# Patient Record
Sex: Female | Born: 1935 | Race: White | Hispanic: No | Smoking: Never smoker
Health system: Southern US, Community
[De-identification: ages and names within clinical notes are randomized; demographics above are authoritative.]

## PROBLEM LIST (undated history)

## (undated) DIAGNOSIS — N390 Urinary tract infection, site not specified: Secondary | ICD-10-CM

## (undated) DIAGNOSIS — E785 Hyperlipidemia, unspecified: Secondary | ICD-10-CM

## (undated) DIAGNOSIS — G250 Essential tremor: Secondary | ICD-10-CM

## (undated) DIAGNOSIS — R55 Syncope and collapse: Secondary | ICD-10-CM

## (undated) DIAGNOSIS — R1011 Right upper quadrant pain: Secondary | ICD-10-CM

## (undated) DIAGNOSIS — I4819 Other persistent atrial fibrillation: Secondary | ICD-10-CM

## (undated) DIAGNOSIS — G5600 Carpal tunnel syndrome, unspecified upper limb: Secondary | ICD-10-CM

## (undated) DIAGNOSIS — M1710 Unilateral primary osteoarthritis, unspecified knee: Secondary | ICD-10-CM

## (undated) DIAGNOSIS — Z9289 Personal history of other medical treatment: Secondary | ICD-10-CM

## (undated) DIAGNOSIS — I5042 Chronic combined systolic (congestive) and diastolic (congestive) heart failure: Secondary | ICD-10-CM

## (undated) DIAGNOSIS — F0390 Unspecified dementia without behavioral disturbance: Secondary | ICD-10-CM

## (undated) DIAGNOSIS — I10 Essential (primary) hypertension: Secondary | ICD-10-CM

## (undated) DIAGNOSIS — K219 Gastro-esophageal reflux disease without esophagitis: Secondary | ICD-10-CM

## (undated) DIAGNOSIS — M179 Osteoarthritis of knee, unspecified: Secondary | ICD-10-CM

## (undated) DIAGNOSIS — G5620 Lesion of ulnar nerve, unspecified upper limb: Secondary | ICD-10-CM

## (undated) HISTORY — PX: BREAST BIOPSY: SHX20

## (undated) HISTORY — DX: Urinary tract infection, site not specified: N39.0

## (undated) HISTORY — DX: Osteoarthritis of knee, unspecified: M17.9

## (undated) HISTORY — PX: BREAST CYST EXCISION: SHX579

## (undated) HISTORY — DX: Lesion of ulnar nerve, unspecified upper limb: G56.20

## (undated) HISTORY — DX: Carpal tunnel syndrome, unspecified upper limb: G56.00

## (undated) HISTORY — PX: TOTAL KNEE ARTHROPLASTY: SHX125

## (undated) HISTORY — DX: Right upper quadrant pain: R10.11

## (undated) HISTORY — DX: Essential (primary) hypertension: I10

## (undated) HISTORY — DX: Essential tremor: G25.0

## (undated) HISTORY — DX: Other persistent atrial fibrillation: I48.19

## (undated) HISTORY — DX: Personal history of other medical treatment: Z92.89

## (undated) HISTORY — DX: Chronic combined systolic (congestive) and diastolic (congestive) heart failure: I50.42

## (undated) HISTORY — DX: Unilateral primary osteoarthritis, unspecified knee: M17.10

## (undated) HISTORY — PX: UMBILICAL HERNIA REPAIR: SHX196

## (undated) HISTORY — DX: Gastro-esophageal reflux disease without esophagitis: K21.9

## (undated) HISTORY — PX: CATARACT EXTRACTION: SUR2

## (undated) HISTORY — DX: Hyperlipidemia, unspecified: E78.5

## (undated) HISTORY — PX: TUBAL LIGATION: SHX77

## (undated) HISTORY — DX: Syncope and collapse: R55

## (undated) HISTORY — DX: Unspecified dementia, unspecified severity, without behavioral disturbance, psychotic disturbance, mood disturbance, and anxiety: F03.90

## (undated) HISTORY — PX: OTHER SURGICAL HISTORY: SHX169

---

## 2002-10-18 HISTORY — PX: CARPAL TUNNEL RELEASE: SHX101

## 2003-10-19 LAB — HM COLONOSCOPY

## 2004-07-23 ENCOUNTER — Ambulatory Visit: Payer: Self-pay | Admitting: Gastroenterology

## 2005-03-10 ENCOUNTER — Emergency Department: Payer: Self-pay | Admitting: Unknown Physician Specialty

## 2005-05-04 ENCOUNTER — Ambulatory Visit: Payer: Self-pay | Admitting: Unknown Physician Specialty

## 2005-05-12 ENCOUNTER — Ambulatory Visit: Payer: Self-pay | Admitting: Unknown Physician Specialty

## 2006-06-16 ENCOUNTER — Ambulatory Visit: Payer: Self-pay | Admitting: Unknown Physician Specialty

## 2007-06-27 ENCOUNTER — Ambulatory Visit: Payer: Self-pay | Admitting: Unknown Physician Specialty

## 2008-02-19 ENCOUNTER — Ambulatory Visit: Payer: Self-pay | Admitting: Cardiology

## 2008-06-28 ENCOUNTER — Ambulatory Visit: Payer: Self-pay | Admitting: Unknown Physician Specialty

## 2009-06-30 ENCOUNTER — Ambulatory Visit: Payer: Self-pay | Admitting: Unknown Physician Specialty

## 2009-07-18 ENCOUNTER — Ambulatory Visit: Payer: Self-pay | Admitting: Family Medicine

## 2009-09-29 ENCOUNTER — Ambulatory Visit: Payer: Self-pay | Admitting: General Practice

## 2009-10-05 ENCOUNTER — Emergency Department: Payer: Self-pay

## 2010-03-30 ENCOUNTER — Ambulatory Visit: Payer: Self-pay | Admitting: General Practice

## 2010-04-15 ENCOUNTER — Inpatient Hospital Stay: Payer: Self-pay | Admitting: General Practice

## 2010-07-07 LAB — HM PAP SMEAR: HM Pap smear: NORMAL

## 2010-07-31 ENCOUNTER — Ambulatory Visit: Payer: Self-pay | Admitting: Unknown Physician Specialty

## 2010-08-18 HISTORY — PX: TOTAL SHOULDER REPLACEMENT: SUR1217

## 2011-09-16 ENCOUNTER — Ambulatory Visit: Payer: Self-pay | Admitting: Unknown Physician Specialty

## 2011-10-29 ENCOUNTER — Ambulatory Visit: Payer: Self-pay | Admitting: Neurology

## 2011-11-03 ENCOUNTER — Ambulatory Visit: Payer: Self-pay | Admitting: Internal Medicine

## 2011-11-04 ENCOUNTER — Ambulatory Visit: Payer: Self-pay | Admitting: Internal Medicine

## 2011-11-22 ENCOUNTER — Encounter: Payer: Self-pay | Admitting: Internal Medicine

## 2011-11-22 ENCOUNTER — Ambulatory Visit (INDEPENDENT_AMBULATORY_CARE_PROVIDER_SITE_OTHER): Payer: 59 | Admitting: Internal Medicine

## 2011-11-22 VITALS — BP 160/86 | HR 62 | Temp 97.7°F | Ht 60.5 in | Wt 188.0 lb

## 2011-11-22 DIAGNOSIS — I10 Essential (primary) hypertension: Secondary | ICD-10-CM | POA: Insufficient documentation

## 2011-11-22 DIAGNOSIS — G250 Essential tremor: Secondary | ICD-10-CM

## 2011-11-22 DIAGNOSIS — E785 Hyperlipidemia, unspecified: Secondary | ICD-10-CM

## 2011-11-22 NOTE — Assessment & Plan Note (Signed)
Blood pressure slightly elevated today. Will obtain previous records as to typical blood pressure. We'll check renal function with labs today. Continue current medications. Followup 3 months.

## 2011-11-22 NOTE — Assessment & Plan Note (Signed)
Symptomatically well controlled with use of Topamax, however patient is concerned about side effects and had some recent changes in her vision in her right eye. Encouraged her to followup with her ophthalmologist Friday for evaluation, an effort to determine if this is related to use of Topamax. If another etiology for her change in vision is determined, then would resume Topamax. Followup here in 3 months.

## 2011-11-22 NOTE — Assessment & Plan Note (Signed)
Will get records on recent lipid profile, and will check LFTs with labs today. Continue pravastatin.

## 2011-11-22 NOTE — Progress Notes (Signed)
Subjective:    Patient ID: Cheryl Winters, female    DOB: 1934-10-08, 78 y.o.   MRN: [id number]  HPI 78 year old female with history of hypertension, hyperlipidemia, and benign essential tremor presents to establish care. Her primary concern today, is her benign essential tremor. She notes she has some improvement with this with the use of Topamax, however recently she has some intermittent blurring in her vision in her right eye and she is concerned this might be related. She has scheduled an appointment with her ophthalmologist for later this week. She has stopped her Topamax and notes recurrence of her tremor. She notes that her tremor is worsened with stress and with intention. However, with use of Topamax she was able to write without difficulty. Aside from the potential visual change she noted no other side effects from Topamax.  In regards to her high blood pressure, she reports full compliance with her medicines. However, she does note that her blood pressures typically elevated. She does not regularly check at home. She denies any headache, palpitations, or chest pain.  Outpatient Encounter Prescriptions as of 11/22/2011  Medication Sig Dispense Refill  . calcium carbonate (OS-CAL - DOSED IN MG OF ELEMENTAL CALCIUM) 1250 MG tablet Take 1 tablet by mouth daily.      . cholecalciferol (VITAMIN D) 1000 UNITS tablet Take 1,000 Units by mouth daily.      . fish oil-omega-3 fatty acids 1000 MG capsule Take 2 g by mouth daily.      . hydrALAZINE (APRESOLINE) 25 MG tablet Take 25 mg by mouth 2 (two) times daily.      . hydrochlorothiazide (HYDRODIURIL) 25 MG tablet Take 25 mg by mouth daily.      Marland Kitchen lisinopril (PRINIVIL,ZESTRIL) 40 MG tablet Take 40 mg by mouth daily.      . Multiple Vitamins-Minerals (MULTIVITAMIN WITH MINERALS) tablet Take 1 tablet by mouth daily.      . pravastatin (PRAVACHOL) 40 MG tablet Take 40 mg by mouth daily.      . propranolol (INDERAL LA) 120 MG 24 hr capsule Take 120 mg by  mouth daily.      Marland Kitchen topiramate (TOPAMAX) 25 MG tablet         Review of Systems  Constitutional: Negative for fever, chills, appetite change, fatigue and unexpected weight change.  HENT: Negative for ear pain, congestion, sore throat, trouble swallowing, neck pain, voice change and sinus pressure.   Eyes: Positive for visual disturbance.  Respiratory: Negative for cough, shortness of breath, wheezing and stridor.   Cardiovascular: Negative for chest pain, palpitations and leg swelling.  Gastrointestinal: Negative for nausea, vomiting, abdominal pain, diarrhea, constipation, blood in stool, abdominal distention and anal bleeding.  Genitourinary: Negative for dysuria and flank pain.  Musculoskeletal: Negative for myalgias, arthralgias and gait problem.  Skin: Negative for color change and rash.  Neurological: Positive for tremors. Negative for dizziness and headaches.  Hematological: Negative for adenopathy. Does not bruise/bleed easily.  Psychiatric/Behavioral: Negative for suicidal ideas, sleep disturbance and dysphoric mood. The patient is not nervous/anxious.    BP 160/86  Pulse 62  Temp(Src) 97.7 F (36.5 C) (Oral)  Ht 5' 0.5" (1.537 m)  Wt 188 lb (85.276 kg)  BMI 36.11 kg/m2  SpO2 97%     Objective:   Physical Exam  Constitutional: She is oriented to person, place, and time. She appears well-developed and well-nourished. No distress.  HENT:  Head: Normocephalic and atraumatic.  Right Ear: External ear normal.  Left Ear: External ear  normal.  Nose: Nose normal.  Mouth/Throat: Oropharynx is clear and moist. No oropharyngeal exudate.  Eyes: Conjunctivae are normal. Pupils are equal, round, and reactive to light. Right eye exhibits no discharge. Left eye exhibits no discharge. No scleral icterus.  Neck: Normal range of motion. Neck supple. No tracheal deviation present. No thyromegaly present.  Cardiovascular: Normal rate, regular rhythm, normal heart sounds and intact distal  pulses.  Exam reveals no gallop and no friction rub.   No murmur heard. Pulmonary/Chest: Effort normal and breath sounds normal. No respiratory distress. She has no wheezes. She has no rales. She exhibits no tenderness.  Abdominal: Soft. Bowel sounds are normal. She exhibits no distension and no mass. There is no tenderness. There is no rebound and no guarding.  Musculoskeletal: Normal range of motion. She exhibits no edema and no tenderness.  Lymphadenopathy:    She has no cervical adenopathy.  Neurological: She is alert and oriented to person, place, and time. She displays tremor. No cranial nerve deficit. She exhibits normal muscle tone. Coordination normal.  Skin: Skin is warm and dry. No rash noted. She is not diaphoretic. No erythema. No pallor.  Psychiatric: She has a normal mood and affect. Her behavior is normal. Judgment and thought content normal.          Assessment & Plan:

## 2011-11-23 LAB — COMPREHENSIVE METABOLIC PANEL
Alkaline Phosphatase: 53 U/L (ref 39–117)
Glucose, Bld: 80 mg/dL (ref 70–99)
Sodium: 141 mEq/L (ref 135–145)
Total Bilirubin: 0.8 mg/dL (ref 0.3–1.2)
Total Protein: 6.7 g/dL (ref 6.0–8.3)

## 2012-01-24 ENCOUNTER — Encounter: Payer: Self-pay | Admitting: Internal Medicine

## 2012-01-27 ENCOUNTER — Other Ambulatory Visit: Payer: Self-pay | Admitting: *Deleted

## 2012-01-27 MED ORDER — HYDROCHLOROTHIAZIDE 25 MG PO TABS: 25.0000 mg | ORAL_TABLET | Freq: Every day | ORAL | Status: DC

## 2012-01-27 MED ORDER — HYDRALAZINE HCL 25 MG PO TABS: 25.0000 mg | ORAL_TABLET | Freq: Two times a day (BID) | ORAL | Status: DC

## 2012-01-27 MED ORDER — LISINOPRIL 40 MG PO TABS: 40.0000 mg | ORAL_TABLET | Freq: Every day | ORAL | Status: DC

## 2012-01-27 MED ORDER — PRAVASTATIN SODIUM 40 MG PO TABS: 40.0000 mg | ORAL_TABLET | Freq: Every day | ORAL | Status: DC

## 2012-02-24 ENCOUNTER — Encounter: Payer: Self-pay | Admitting: Internal Medicine

## 2012-02-24 ENCOUNTER — Ambulatory Visit (INDEPENDENT_AMBULATORY_CARE_PROVIDER_SITE_OTHER): Payer: 59 | Admitting: Internal Medicine

## 2012-02-24 VITALS — BP 122/70 | HR 81 | Temp 98.0°F | Wt 182.5 lb

## 2012-02-24 DIAGNOSIS — G250 Essential tremor: Secondary | ICD-10-CM

## 2012-02-24 DIAGNOSIS — E785 Hyperlipidemia, unspecified: Secondary | ICD-10-CM

## 2012-02-24 DIAGNOSIS — I10 Essential (primary) hypertension: Secondary | ICD-10-CM

## 2012-02-24 NOTE — Assessment & Plan Note (Signed)
We'll plan to recheck LFTs and lipids in October 2013. Goal LDL less than 100. Continue pravastatin.

## 2012-02-24 NOTE — Assessment & Plan Note (Signed)
Patient has been restarted on Neurontin by her local neurologist. Cheryl Winters continue to monitor to see if any improvement in her symptoms. She has been intolerant of other medications including Topamax. We also discussed potential referral to tertiary care center if symptoms are persistent.

## 2012-02-24 NOTE — Assessment & Plan Note (Signed)
Blood pressure is well-controlled today. We'll continue current medications. We'll plan to recheck renal function with physical exam in October 2013.

## 2012-02-24 NOTE — Progress Notes (Signed)
Subjective:    Patient ID: Cheryl Winters, female    DOB: [birth year], 78 y.o.   MRN: [id number]  HPI 78 year old female with history of hypertension, hyperlipidemia, and benign essential tremor presents for followup. She reports that she was recently seen by her neurologist and was taken off Topamax. She was started on Neurontin to help with tremor. She has not yet started taking the medication. She again notes tremor in both her head and right arm. This sometimes interferes with her writing. She discussed potential referral to tertiary care center with her neurologist locally but has decided to defer for now.  In regards to her hypertension and hyperlipidemia, she reports full compliance with her medications. She denies any noted side effects such as headache or myalgia. She denies any new concerns today.  Outpatient Encounter Prescriptions as of 02/24/2012  Medication Sig Dispense Refill  . calcium carbonate (OS-CAL - DOSED IN MG OF ELEMENTAL CALCIUM) 1250 MG tablet Take 1 tablet by mouth daily.      . cholecalciferol (VITAMIN D) 1000 UNITS tablet Take 1,000 Units by mouth daily.      . fish oil-omega-3 fatty acids 1000 MG capsule Take 2 g by mouth daily.      . hydrALAZINE (APRESOLINE) 25 MG tablet Take 1 tablet (25 mg total) by mouth 2 (two) times daily.  180 tablet  2  . hydrochlorothiazide (HYDRODIURIL) 25 MG tablet Take 1 tablet (25 mg total) by mouth daily.  90 tablet  2  . lisinopril (PRINIVIL,ZESTRIL) 40 MG tablet Take 1 tablet (40 mg total) by mouth daily.  90 tablet  2  . Multiple Vitamins-Minerals (MULTIVITAMIN WITH MINERALS) tablet Take 1 tablet by mouth daily.      . pravastatin (PRAVACHOL) 40 MG tablet Take 1 tablet (40 mg total) by mouth daily.  90 tablet  2  . gabapentin (NEURONTIN) 100 MG capsule       . DISCONTD: propranolol (INDERAL LA) 120 MG 24 hr capsule Take 120 mg by mouth daily.      Marland Kitchen DISCONTD: topiramate (TOPAMAX) 25 MG tablet         Review of Systems  Constitutional:  Negative for fever, chills, appetite change, fatigue and unexpected weight change.  HENT: Negative for ear pain, congestion, sore throat, trouble swallowing, neck pain, voice change and sinus pressure.   Eyes: Negative for visual disturbance.  Respiratory: Negative for cough, shortness of breath, wheezing and stridor.   Cardiovascular: Negative for chest pain, palpitations and leg swelling.  Gastrointestinal: Negative for nausea, vomiting, abdominal pain, diarrhea, constipation, blood in stool, abdominal distention and anal bleeding.  Genitourinary: Negative for dysuria and flank pain.  Musculoskeletal: Negative for myalgias, arthralgias and gait problem.  Skin: Negative for color change and rash.  Neurological: Positive for tremors. Negative for dizziness and headaches.  Hematological: Negative for adenopathy. Does not bruise/bleed easily.  Psychiatric/Behavioral: Negative for suicidal ideas, sleep disturbance and dysphoric mood. The patient is not nervous/anxious.    BP 122/70  Pulse 81  Temp(Src) 98 F (36.7 C) (Oral)  Wt 182 lb 8 oz (82.781 kg)     Objective:   Physical Exam  Constitutional: She is oriented to person, place, and time. She appears well-developed and well-nourished. No distress.  HENT:  Head: Normocephalic and atraumatic.  Right Ear: External ear normal.  Left Ear: External ear normal.  Nose: Nose normal.  Mouth/Throat: Oropharynx is clear and moist. No oropharyngeal exudate.  Eyes: Conjunctivae are normal. Pupils are equal, round, and reactive to  light. Right eye exhibits no discharge. Left eye exhibits no discharge. No scleral icterus.  Neck: Normal range of motion. Neck supple. No tracheal deviation present. No thyromegaly present.  Cardiovascular: Normal rate, regular rhythm, normal heart sounds and intact distal pulses.  Exam reveals no gallop and no friction rub.   No murmur heard. Pulmonary/Chest: Effort normal and breath sounds normal. No respiratory  distress. She has no wheezes. She has no rales. She exhibits no tenderness.  Musculoskeletal: Normal range of motion. She exhibits no edema and no tenderness.  Lymphadenopathy:    She has no cervical adenopathy.  Neurological: She is alert and oriented to person, place, and time. She displays tremor. No cranial nerve deficit. She exhibits normal muscle tone. Coordination normal.  Skin: Skin is warm and dry. No rash noted. She is not diaphoretic. No erythema. No pallor.  Psychiatric: She has a normal mood and affect. Her behavior is normal. Judgment and thought content normal.          Assessment & Plan:

## 2012-08-21 ENCOUNTER — Ambulatory Visit (INDEPENDENT_AMBULATORY_CARE_PROVIDER_SITE_OTHER): Payer: 59 | Admitting: Internal Medicine

## 2012-08-21 ENCOUNTER — Encounter: Payer: Self-pay | Admitting: Internal Medicine

## 2012-08-21 VITALS — BP 110/72 | HR 58 | Temp 97.8°F | Ht 60.75 in | Wt 175.8 lb

## 2012-08-21 DIAGNOSIS — G252 Other specified forms of tremor: Secondary | ICD-10-CM

## 2012-08-21 DIAGNOSIS — Z0000 Encounter for general adult medical examination without abnormal findings: Secondary | ICD-10-CM | POA: Insufficient documentation

## 2012-08-21 DIAGNOSIS — G250 Essential tremor: Secondary | ICD-10-CM | POA: Insufficient documentation

## 2012-08-21 DIAGNOSIS — B3789 Other sites of candidiasis: Secondary | ICD-10-CM | POA: Insufficient documentation

## 2012-08-21 DIAGNOSIS — I10 Essential (primary) hypertension: Secondary | ICD-10-CM

## 2012-08-21 DIAGNOSIS — E785 Hyperlipidemia, unspecified: Secondary | ICD-10-CM

## 2012-08-21 LAB — LIPID PANEL
Cholesterol: 184 mg/dL (ref 0–200)
HDL: 64.7 mg/dL (ref >39.00)
LDL Cholesterol: 109 mg/dL — ABNORMAL HIGH (ref 0–99)
Total CHOL/HDL Ratio: 3
Triglycerides: 52 mg/dL (ref 0.0–149.0)

## 2012-08-21 LAB — COMPREHENSIVE METABOLIC PANEL
AST: 22 U/L (ref 0–37)
Alkaline Phosphatase: 45 U/L (ref 39–117)
BUN: 16 mg/dL (ref 6–23)
Creatinine, Ser: 0.6 mg/dL (ref 0.4–1.2)
Glucose, Bld: 82 mg/dL (ref 70–99)
Total Bilirubin: 0.8 mg/dL (ref 0.3–1.2)

## 2012-08-21 MED ORDER — LISINOPRIL 40 MG PO TABS: 40.0000 mg | ORAL_TABLET | Freq: Every day | ORAL | Status: DC

## 2012-08-21 MED ORDER — NYSTATIN 100000 UNIT/GM EX POWD: Freq: Four times a day (QID) | CUTANEOUS | Status: DC

## 2012-08-21 MED ORDER — PRAVASTATIN SODIUM 40 MG PO TABS: 40.0000 mg | ORAL_TABLET | Freq: Every day | ORAL | Status: DC

## 2012-08-21 MED ORDER — HYDROCHLOROTHIAZIDE 25 MG PO TABS: 25.0000 mg | ORAL_TABLET | Freq: Every day | ORAL | Status: DC

## 2012-08-21 MED ORDER — HYDRALAZINE HCL 25 MG PO TABS: 25.0000 mg | ORAL_TABLET | Freq: Two times a day (BID) | ORAL | Status: DC

## 2012-08-21 NOTE — Assessment & Plan Note (Signed)
BP well controlled on current medications. Will check renal function with labs today. Follow up 6 months and prn.

## 2012-08-21 NOTE — Assessment & Plan Note (Signed)
Will check lipids and LFTs with labs today. 

## 2012-08-21 NOTE — Progress Notes (Signed)
Subjective:    Patient ID: Cheryl Winters, female    DOB: 28-Apr-1934, 78 y.o.   MRN: [id number]  HPI  The patient is here for annual Medicare wellness examination and management of other chronic and acute problems.   The risk factors are reflected in the social history.  The roster of all physicians providing medical care to patient - is listed in the Snapshot section of the chart.  Activities of daily living:  The patient is 100% independent in all ADLs: dressing, toileting, feeding as well as independent mobility  Home safety : The patient has smoke detectors in the home. They wear seatbelts.  There are no firearms at home. There is no violence in the home.   There is no risks for hepatitis, STDs or HIV. There is no history of blood transfusion. They have no travel history to infectious disease endemic areas of the world.  The patient has not seen their dentist in the last six month. (no dentist, wears dentures) They have not seen their eye doctor in the last year. [facility] in past) No issues with hearing. They have deferred audiologic testing in the last year.   They do not  have excessive sun exposure. Discussed the need for sun protection: hats, long sleeves and use of sunscreen if there is significant sun exposure. Dermatologist - none currently  Diet: the importance of a healthy diet is discussed. They do have a healthy diet.  The benefits of regular aerobic exercise were discussed. She walks occasionally.  Depression screen: there are no signs or vegative symptoms of depression- irritability, change in appetite, anhedonia, sadness/tearfullness.  Cognitive assessment: the patient manages all their financial and personal affairs and is actively engaged. They could relate day,date,year and events.  HCPOA -daughters, [name].  The following portions of the patient's history were reviewed and updated as appropriate: allergies, current medications, past  family history, past medical history,  past surgical history, past social history  and problem list.  Visual acuity was not assessed per patient preference since she has regular follow up with her ophthalmologist. Hearing and body mass index were assessed and reviewed.   During the course of the visit the patient was educated and counseled about appropriate screening and preventive services including : fall prevention , diabetes screening, nutrition counseling, colorectal cancer screening, and recommended immunizations.    Outpatient Encounter Prescriptions as of 08/21/2012  Medication Sig Dispense Refill  . calcium carbonate (OS-CAL - DOSED IN MG OF ELEMENTAL CALCIUM) 1250 MG tablet Take 1 tablet by mouth daily.      . cholecalciferol (VITAMIN D) 1000 UNITS tablet Take 1,000 Units by mouth daily.      . fish oil-omega-3 fatty acids 1000 MG capsule Take 2 g by mouth daily.      . hydrALAZINE (APRESOLINE) 25 MG tablet Take 1 tablet (25 mg total) by mouth 2 (two) times daily.  180 tablet  2  . hydrochlorothiazide (HYDRODIURIL) 25 MG tablet Take 1 tablet (25 mg total) by mouth daily.  90 tablet  2  . lisinopril (PRINIVIL,ZESTRIL) 40 MG tablet Take 1 tablet (40 mg total) by mouth daily.  90 tablet  2  . Multiple Vitamins-Minerals (MULTIVITAMIN WITH MINERALS) tablet Take 1 tablet by mouth daily.      . pravastatin (PRAVACHOL) 40 MG tablet Take 1 tablet (40 mg total) by mouth daily.  90 tablet  2   BP 110/72  Pulse 58  Temp 97.8 F (36.6 C) (Oral)  Ht 5' 0.75" (1.543 m)  Wt 175 lb 12 oz (79.72 kg)  BMI 33.48 kg/m2  SpO2 97%  Review of Systems  Constitutional: Negative for fever, chills, appetite change, fatigue and unexpected weight change.  HENT: Negative for ear pain, congestion, sore throat, trouble swallowing, neck pain, voice change and sinus pressure.   Eyes: Negative for visual disturbance.  Respiratory: Negative for cough, shortness of breath, wheezing and stridor.   Cardiovascular:  Negative for chest pain, palpitations and leg swelling.  Gastrointestinal: Negative for nausea, vomiting, abdominal pain, diarrhea, constipation, blood in stool, abdominal distention and anal bleeding.  Genitourinary: Negative for dysuria and flank pain.  Musculoskeletal: Negative for myalgias, arthralgias and gait problem.  Skin: Negative for color change and rash.  Neurological: Positive for tremors. Negative for dizziness and headaches.  Hematological: Negative for adenopathy. Does not bruise/bleed easily.  Psychiatric/Behavioral: Negative for suicidal ideas, sleep disturbance and dysphoric mood. The patient is not nervous/anxious.        Objective:   Physical Exam  Constitutional: She is oriented to person, place, and time. She appears well-developed and well-nourished. No distress.  HENT:  Head: Normocephalic and atraumatic.  Right Ear: External ear normal.  Left Ear: External ear normal.  Nose: Nose normal.  Mouth/Throat: Oropharynx is clear and moist. No oropharyngeal exudate.  Eyes: Conjunctivae normal are normal. Pupils are equal, round, and reactive to light. Right eye exhibits no discharge. Left eye exhibits no discharge. No scleral icterus.  Neck: Normal range of motion. Neck supple. No tracheal deviation present. No thyromegaly present.  Cardiovascular: Normal rate, regular rhythm, normal heart sounds and intact distal pulses.  Exam reveals no gallop and no friction rub.   No murmur heard. Pulmonary/Chest: Effort normal and breath sounds normal. No respiratory distress. She has no wheezes. She has no rales. She exhibits no tenderness. Right breast exhibits no inverted nipple, no mass, no nipple discharge, no skin change and no tenderness. Left breast exhibits no inverted nipple, no mass, no nipple discharge, no skin change and no tenderness. Breasts are symmetrical.  Abdominal: Soft. Bowel sounds are normal. She exhibits no distension. There is no tenderness. There is no  guarding.  Musculoskeletal: Normal range of motion. She exhibits no edema and no tenderness.  Lymphadenopathy:    She has no cervical adenopathy.  Neurological: She is alert and oriented to person, place, and time. No cranial nerve deficit. She exhibits normal muscle tone. Coordination normal.  Skin: Skin is warm and dry. No rash noted. She is not diaphoretic. No erythema. No pallor.  Psychiatric: She has a normal mood and affect. Her behavior is normal. Judgment and thought content normal.          Assessment & Plan:

## 2012-08-21 NOTE — Assessment & Plan Note (Signed)
Gen med exam is normal today including breast exam. Pelvic exam and PAP deferred because of age and pt preference. Health maintenance is UTD except for mammogram which will be scheduled. Labs today including CMP, lipids. Will set up dermatology evaluation and neurology referral for essential tremor.  Vaccines UTD. Follow up 6 months and prn.

## 2012-08-21 NOTE — Assessment & Plan Note (Signed)
Exam is consistent with candidiasis of skin under breast. Will treat with topical nystatin powder. Pt will call if symptoms not improving.

## 2012-08-21 NOTE — Assessment & Plan Note (Signed)
No improvement with neurontin as prescribed by previous neurologist. Would like second opinion about treatment. Will set up neurology evaluation.

## 2012-08-22 ENCOUNTER — Encounter: Payer: Self-pay | Admitting: *Deleted

## 2012-08-22 NOTE — Progress Notes (Signed)
Result letter mailed to patient's home address.

## 2012-08-28 ENCOUNTER — Ambulatory Visit: Payer: 59 | Admitting: Neurology

## 2012-09-21 ENCOUNTER — Ambulatory Visit: Payer: Self-pay | Admitting: Internal Medicine

## 2012-10-12 ENCOUNTER — Encounter: Payer: Self-pay | Admitting: Internal Medicine

## 2012-10-23 ENCOUNTER — Telehealth: Payer: Self-pay | Admitting: Internal Medicine

## 2012-10-23 NOTE — Telephone Encounter (Signed)
Wanting mammogram results.

## 2012-10-23 NOTE — Telephone Encounter (Signed)
Mammogram results from 09/21/2012 were normal, patient advised via telephone.

## 2012-12-29 ENCOUNTER — Ambulatory Visit (INDEPENDENT_AMBULATORY_CARE_PROVIDER_SITE_OTHER): Payer: 59 | Admitting: Internal Medicine

## 2012-12-29 ENCOUNTER — Encounter: Payer: Self-pay | Admitting: Internal Medicine

## 2012-12-29 ENCOUNTER — Telehealth: Payer: Self-pay | Admitting: Internal Medicine

## 2012-12-29 VITALS — BP 120/80 | HR 68 | Temp 98.1°F | Wt 172.0 lb

## 2012-12-29 DIAGNOSIS — G250 Essential tremor: Secondary | ICD-10-CM

## 2012-12-29 DIAGNOSIS — Z780 Asymptomatic menopausal state: Secondary | ICD-10-CM

## 2012-12-29 DIAGNOSIS — I10 Essential (primary) hypertension: Secondary | ICD-10-CM

## 2012-12-29 LAB — COMPREHENSIVE METABOLIC PANEL
ALT: 19 U/L (ref 0–35)
Albumin: 4.1 g/dL (ref 3.5–5.2)
BUN: 15 mg/dL (ref 6–23)
CO2: 30 mEq/L (ref 19–32)
Calcium: 9.7 mg/dL (ref 8.4–10.5)
Chloride: 104 mEq/L (ref 96–112)
Creatinine, Ser: 0.7 mg/dL (ref 0.4–1.2)
GFR: 84.85 mL/min (ref >60.00)
Potassium: 4.3 mEq/L (ref 3.5–5.1)

## 2012-12-29 LAB — MICROALBUMIN / CREATININE URINE RATIO
Creatinine,U: 57.2 mg/dL
Microalb Creat Ratio: 5.9 mg/g (ref 0.0–30.0)

## 2012-12-29 NOTE — Assessment & Plan Note (Signed)
BP Readings from Last 3 Encounters:  12/29/12 120/80  08/21/12 110/72  02/24/12 122/70   Blood pressure well-controlled on current medications. Will check renal function with labs today.

## 2012-12-29 NOTE — Progress Notes (Signed)
Subjective:    Patient ID: Cheryl Winters, female    DOB: October 23, 1933, 78 y.o.   MRN: [id number]  HPI 79 year old female with history of hypertension and benign essential tremor presents for followup. She reports she is generally feeling well. In regards to her tremor, we have tried to set her up with a neurologist as she has tried several medications in the past with no improvement. However, there was miscommunication and she never received appointment time. She reports that symptoms of essential tremor unchanged. Next  In regards to hypertension, she reports compliance with her medication. She denies any recent chest pain, headache, palpitations.  Outpatient Encounter Prescriptions as of 12/29/2012  Medication Sig Dispense Refill  . Biotin 5 MG CAPS Take 5 mg by mouth daily.      . calcium carbonate (OS-CAL - DOSED IN MG OF ELEMENTAL CALCIUM) 1250 MG tablet Take 1 tablet by mouth daily.      . cholecalciferol (VITAMIN D) 1000 UNITS tablet Take 1,000 Units by mouth daily.      . fish oil-omega-3 fatty acids 1000 MG capsule Take 2 g by mouth daily.      . hydrALAZINE (APRESOLINE) 25 MG tablet Take 1 tablet (25 mg total) by mouth 2 (two) times daily.  180 tablet  2  . hydrochlorothiazide (HYDRODIURIL) 25 MG tablet Take 1 tablet (25 mg total) by mouth daily.  90 tablet  2  . lisinopril (PRINIVIL,ZESTRIL) 40 MG tablet Take 1 tablet (40 mg total) by mouth daily.  90 tablet  2  . MAGNESIUM GLYCINATE PLUS PO Take 400 mg by mouth daily.      . Multiple Vitamins-Minerals (MULTIVITAMIN WITH MINERALS) tablet Take 1 tablet by mouth daily.      Marland Kitchen nystatin (MYCOSTATIN) powder Apply topically 4 (four) times daily.  15 g  0  . pravastatin (PRAVACHOL) 40 MG tablet Take 1 tablet (40 mg total) by mouth daily.  90 tablet  2   No facility-administered encounter medications on file as of 12/29/2012.   BP 120/80  Pulse 68  Temp(Src) 98.1 F (36.7 C) (Oral)  Wt 172 lb (78.019 kg)  BMI 32.77 kg/m2  SpO2 96%  Review  of Systems  Constitutional: Negative for fever, chills, appetite change, fatigue and unexpected weight change.  HENT: Negative for ear pain, congestion, sore throat, trouble swallowing, neck pain, voice change and sinus pressure.   Eyes: Negative for visual disturbance.  Respiratory: Negative for cough, shortness of breath, wheezing and stridor.   Cardiovascular: Negative for chest pain, palpitations and leg swelling.  Gastrointestinal: Negative for nausea, vomiting, abdominal pain, diarrhea, constipation, blood in stool, abdominal distention and anal bleeding.  Genitourinary: Negative for dysuria and flank pain.  Musculoskeletal: Negative for myalgias, arthralgias and gait problem.  Skin: Negative for color change and rash.  Neurological: Positive for tremors. Negative for dizziness and headaches.  Hematological: Negative for adenopathy. Does not bruise/bleed easily.  Psychiatric/Behavioral: Negative for suicidal ideas, sleep disturbance and dysphoric mood. The patient is not nervous/anxious.        Objective:   Physical Exam  Constitutional: She is oriented to person, place, and time. She appears well-developed and well-nourished. No distress.  HENT:  Head: Normocephalic and atraumatic.  Right Ear: External ear normal.  Left Ear: External ear normal.  Nose: Nose normal.  Mouth/Throat: Oropharynx is clear and moist. No oropharyngeal exudate.  Eyes: Conjunctivae are normal. Pupils are equal, round, and reactive to light. Right eye exhibits no discharge. Left eye exhibits no discharge.  No scleral icterus.  Neck: Normal range of motion. Neck supple. No tracheal deviation present. No thyromegaly present.  Cardiovascular: Normal rate, regular rhythm, normal heart sounds and intact distal pulses.  Exam reveals no gallop and no friction rub.   No murmur heard. Pulmonary/Chest: Effort normal and breath sounds normal. No respiratory distress. She has no wheezes. She has no rales. She exhibits no  tenderness.  Musculoskeletal: Normal range of motion. She exhibits no edema and no tenderness.  Lymphadenopathy:    She has no cervical adenopathy.  Neurological: She is alert and oriented to person, place, and time. She displays tremor (head). No cranial nerve deficit. She exhibits normal muscle tone. Coordination normal.  Skin: Skin is warm and dry. No rash noted. She is not diaphoretic. No erythema. No pallor.  Psychiatric: She has a normal mood and affect. Her behavior is normal. Judgment and thought content normal.          Assessment & Plan:

## 2012-12-29 NOTE — Assessment & Plan Note (Signed)
Symptoms unchanged. Will reschedule evaluation with neurology for essential tremor.

## 2012-12-29 NOTE — Telephone Encounter (Signed)
Pt is needing note for her weight loss program that state what a good weight loss goal would be for the pt. Please call pt when ready to come pick it up. Best number [phone]

## 2013-01-01 ENCOUNTER — Ambulatory Visit: Payer: Self-pay | Admitting: Internal Medicine

## 2013-01-02 ENCOUNTER — Telehealth: Payer: Self-pay | Admitting: Internal Medicine

## 2013-01-02 NOTE — Telephone Encounter (Signed)
Patient informed and verbalized understanding

## 2013-01-02 NOTE — Telephone Encounter (Signed)
Bone density was normal. T- score -1.

## 2013-01-23 ENCOUNTER — Encounter: Payer: Self-pay | Admitting: Internal Medicine

## 2013-03-02 ENCOUNTER — Other Ambulatory Visit: Payer: Self-pay | Admitting: Internal Medicine

## 2013-03-02 NOTE — Telephone Encounter (Signed)
Ok to refill 

## 2013-06-01 ENCOUNTER — Other Ambulatory Visit: Payer: Self-pay | Admitting: Internal Medicine

## 2013-08-02 ENCOUNTER — Ambulatory Visit (INDEPENDENT_AMBULATORY_CARE_PROVIDER_SITE_OTHER): Payer: 59 | Admitting: Internal Medicine

## 2013-08-02 ENCOUNTER — Encounter: Payer: Self-pay | Admitting: Internal Medicine

## 2013-08-02 VITALS — BP 134/80 | HR 50 | Temp 98.4°F | Ht 60.25 in | Wt 179.0 lb

## 2013-08-02 DIAGNOSIS — Z0000 Encounter for general adult medical examination without abnormal findings: Secondary | ICD-10-CM

## 2013-08-02 NOTE — Progress Notes (Signed)
  Subjective:    Patient ID: Cheryl Winters, female    DOB: 11/12/1933, 79 y.o.   MRN: [id number]  HPI No charge - scheduled in error   Review of Systems     Objective:   Physical Exam        Assessment & Plan:

## 2013-08-27 ENCOUNTER — Encounter: Payer: Self-pay | Admitting: Internal Medicine

## 2013-08-27 ENCOUNTER — Ambulatory Visit (INDEPENDENT_AMBULATORY_CARE_PROVIDER_SITE_OTHER): Payer: 59 | Admitting: Internal Medicine

## 2013-08-27 ENCOUNTER — Encounter (INDEPENDENT_AMBULATORY_CARE_PROVIDER_SITE_OTHER): Payer: Self-pay

## 2013-08-27 VITALS — BP 140/80 | HR 59 | Temp 98.5°F | Ht 60.25 in | Wt 173.8 lb

## 2013-08-27 DIAGNOSIS — I10 Essential (primary) hypertension: Secondary | ICD-10-CM

## 2013-08-27 DIAGNOSIS — G250 Essential tremor: Secondary | ICD-10-CM

## 2013-08-27 DIAGNOSIS — Z0000 Encounter for general adult medical examination without abnormal findings: Secondary | ICD-10-CM

## 2013-08-27 DIAGNOSIS — Z780 Asymptomatic menopausal state: Secondary | ICD-10-CM

## 2013-08-27 DIAGNOSIS — E785 Hyperlipidemia, unspecified: Secondary | ICD-10-CM

## 2013-08-27 DIAGNOSIS — Z1239 Encounter for other screening for malignant neoplasm of breast: Secondary | ICD-10-CM

## 2013-08-27 LAB — CBC WITH DIFFERENTIAL/PLATELET
Basophils Absolute: 0 10*3/uL (ref 0.0–0.1)
Eosinophils Absolute: 0.1 10*3/uL (ref 0.0–0.7)
Eosinophils Relative: 1.4 % (ref 0.0–5.0)
HCT: 39.1 % (ref 36.0–46.0)
Lymphs Abs: 1.5 10*3/uL (ref 0.7–4.0)
MCHC: 34.2 g/dL (ref 30.0–36.0)
MCV: 89.8 fl (ref 78.0–100.0)
Monocytes Absolute: 0.4 10*3/uL (ref 0.1–1.0)
Neutrophils Relative %: 68.2 % (ref 43.0–77.0)
Platelets: 191 10*3/uL (ref 150.0–400.0)
RDW: 13 % (ref 11.5–14.6)
WBC: 6.2 10*3/uL (ref 4.5–10.5)

## 2013-08-27 LAB — MICROALBUMIN / CREATININE URINE RATIO
Creatinine,U: 230.4 mg/dL
Microalb, Ur: 5 mg/dL — ABNORMAL HIGH (ref 0.0–1.9)

## 2013-08-27 LAB — COMPREHENSIVE METABOLIC PANEL
AST: 22 U/L (ref 0–37)
Albumin: 4.1 g/dL (ref 3.5–5.2)
BUN: 14 mg/dL (ref 6–23)
Calcium: 9.6 mg/dL (ref 8.4–10.5)
Chloride: 104 mEq/L (ref 96–112)
Creatinine, Ser: 0.6 mg/dL (ref 0.4–1.2)
Glucose, Bld: 99 mg/dL (ref 70–99)

## 2013-08-27 LAB — LIPID PANEL
Cholesterol: 205 mg/dL — ABNORMAL HIGH (ref 0–200)
HDL: 71.1 mg/dL (ref >39.00)
Triglycerides: 56 mg/dL (ref 0.0–149.0)

## 2013-08-27 LAB — LDL CHOLESTEROL, DIRECT: Direct LDL: 123.9 mg/dL

## 2013-08-27 NOTE — Assessment & Plan Note (Signed)
BP Readings from Last 3 Encounters:  08/27/13 140/80  08/02/13 134/80  12/29/12 120/80   BP generally well controlled on current medications. Will continue.

## 2013-08-27 NOTE — Assessment & Plan Note (Signed)
Benign head tremor. Unable to tolerate neurontin and primodone because of dizziness and drowsiness. Encouraged her to follow up with her neurologist. Will continue propranolol.

## 2013-08-27 NOTE — Progress Notes (Signed)
Pre visit review using our clinic review tool, if applicable. No additional management support is needed unless otherwise documented below in the visit note. 

## 2013-08-27 NOTE — Assessment & Plan Note (Signed)
Will check lipids and LFTs with labs today. 

## 2013-08-27 NOTE — Assessment & Plan Note (Signed)
General medical exam including breast exam normal. PAP and pelvic deferred given pt age and preference. Last PAP normal 2011. Mammogram ordered. Colonoscopy. Immunizations UTD. Will check labs today including CBC, CMP, lipids, urine microalbumin. Follow up 6 months and prn.

## 2013-08-27 NOTE — Progress Notes (Signed)
Subjective:    Patient ID: Cheryl Winters, female    DOB: [birth year], 79 y.o.   MRN: [id number]  HPI The patient is here for annual Medicare wellness examination and management of other chronic and acute problems.   The risk factors are reflected in the social history.  The roster of all physicians providing medical care to patient - is listed in the Snapshot section of the chart.  Activities of daily living:  The patient is 100% independent in all ADLs: dressing, toileting, feeding as well as independent mobility  Home safety : The patient has smoke detectors in the home. They wear seatbelts.  There are no firearms at home. There is no violence in the home.   There is no risks for hepatitis, STDs or HIV. There is no history of blood transfusion. They have no travel history to infectious disease endemic areas of the world.  The patient has not seen their dentist in the last six month. (no dentist, wears dentures) They have not seen their eye doctor in the last year. [facility] in past, now attending [facility]) No issues with hearing. They have deferred audiologic testing in the last year.   They do not  have excessive sun exposure. Discussed the need for sun protection: hats, long sleeves and use of sunscreen if there is significant sun exposure. Dermatologist - Dr. Adolphus Birchwood  Diet: the importance of a healthy diet is discussed. They do have a healthy diet.  The benefits of regular aerobic exercise were discussed. She walks occasionally.  Depression screen: there are no signs or vegative symptoms of depression- irritability, change in appetite, anhedonia, sadness/tearfullness.  Cognitive assessment: the patient manages all their financial and personal affairs and is actively engaged. They could relate day,date,year and events.  HCPOA -daughters, Cheryl Winters.  The following portions of the patient's history were reviewed and updated as appropriate: allergies,  current medications, past family history, past medical history,  past surgical history, past social history  and problem list.  Visual acuity was not assessed per patient preference since she has regular follow up with her ophthalmologist. Hearing and body mass index were assessed and reviewed.   She recently saw her neurologist on Friday and he changed her Neurontin to Primodone. She took this medication on Friday night and Saturday morning, then experienced dizziness afterward. She stopped the medication and this improved.  Outpatient Encounter Prescriptions as of 08/27/2013  Medication Sig  . Biotin 5 MG CAPS Take 5 mg by mouth daily.  . calcium carbonate (OS-CAL - DOSED IN MG OF ELEMENTAL CALCIUM) 1250 MG tablet Take 1 tablet by mouth daily.  . cholecalciferol (VITAMIN D) 1000 UNITS tablet Take 1,000 Units by mouth daily.  . fish oil-omega-3 fatty acids 1000 MG capsule Take 2 g by mouth daily.  . hydrALAZINE (APRESOLINE) 25 MG tablet TAKE 1 TABLET TWICE A DAY  . hydrochlorothiazide (HYDRODIURIL) 25 MG tablet TAKE 1 TABLET DAILY  . lisinopril (PRINIVIL,ZESTRIL) 40 MG tablet TAKE 1 TABLET DAILY  . Multiple Vitamins-Minerals (MULTIVITAMIN WITH MINERALS) tablet Take 1 tablet by mouth daily.  Marland Kitchen nystatin (MYCOSTATIN) powder APPLY TOPICALLY 4 (FOUR) TIMES DAILY.  . pravastatin (PRAVACHOL) 40 MG tablet TAKE 1 TABLET DAILY  . propranolol (INDERAL) 40 MG tablet Take 40 mg by mouth 2 (two) times daily.   BP 140/80  Pulse 59  Temp(Src) 98.5 F (36.9 C) (Oral)  Ht 5' 0.25" (1.53 m)  Wt 173 lb 12 oz (78.812 kg)  BMI  33.67 kg/m2  SpO2 97%  Review of Systems  Constitutional: Negative for fever, chills, appetite change, fatigue and unexpected weight change.  HENT: Negative for congestion, ear pain, sinus pressure, sore throat, trouble swallowing and voice change.   Eyes: Negative for visual disturbance.  Respiratory: Negative for cough, shortness of breath, wheezing and stridor.    Cardiovascular: Negative for chest pain, palpitations and leg swelling.  Gastrointestinal: Negative for nausea, vomiting, abdominal pain, diarrhea, constipation, blood in stool, abdominal distention and anal bleeding.  Genitourinary: Negative for dysuria and flank pain.  Musculoskeletal: Negative for arthralgias, gait problem, myalgias and neck pain.  Skin: Negative for color change and rash.  Neurological: Positive for dizziness (over the weekend, now improved) and tremors (head tremor). Negative for headaches.  Hematological: Negative for adenopathy. Does not bruise/bleed easily.  Psychiatric/Behavioral: Negative for suicidal ideas, sleep disturbance and dysphoric mood. The patient is not nervous/anxious.        Objective:   Physical Exam  Constitutional: She is oriented to person, place, and time. She appears well-developed and well-nourished. No distress.  HENT:  Head: Normocephalic and atraumatic.  Right Ear: External ear normal.  Left Ear: External ear normal.  Nose: Nose normal.  Mouth/Throat: Oropharynx is clear and moist. No oropharyngeal exudate.  Eyes: Conjunctivae are normal. Pupils are equal, round, and reactive to light. Right eye exhibits no discharge. Left eye exhibits no discharge. No scleral icterus.  Neck: Normal range of motion. Neck supple. No tracheal deviation present. No thyromegaly present.  Cardiovascular: Normal rate, regular rhythm, normal heart sounds and intact distal pulses.  Exam reveals no gallop and no friction rub.   No murmur heard. Pulmonary/Chest: Effort normal and breath sounds normal. No accessory muscle usage. Not tachypneic. No respiratory distress. She has no decreased breath sounds. She has no wheezes. She has no rhonchi. She has no rales. She exhibits no tenderness. Right breast exhibits no inverted nipple, no mass, no nipple discharge, no skin change and no tenderness. Left breast exhibits no inverted nipple, no mass, no nipple discharge, no  skin change and no tenderness. Breasts are symmetrical.  Abdominal: Soft. Bowel sounds are normal. She exhibits no distension and no mass. There is no tenderness. There is no rebound and no guarding.  Musculoskeletal: Normal range of motion. She exhibits no edema and no tenderness.  Lymphadenopathy:    She has no cervical adenopathy.  Neurological: She is alert and oriented to person, place, and time. She displays tremor (head). No cranial nerve deficit or sensory deficit. She exhibits normal muscle tone. Coordination normal.  Skin: Skin is warm and dry. No rash noted. She is not diaphoretic. No erythema. No pallor.  Psychiatric: She has a normal mood and affect. Her behavior is normal. Judgment and thought content normal.          Assessment & Plan:

## 2013-08-28 ENCOUNTER — Encounter: Payer: Self-pay | Admitting: *Deleted

## 2013-08-30 ENCOUNTER — Other Ambulatory Visit: Payer: Self-pay | Admitting: Internal Medicine

## 2013-08-30 NOTE — Telephone Encounter (Signed)
Eprescribed.

## 2013-09-02 LAB — HM MAMMOGRAPHY: HM Mammogram: NORMAL

## 2013-09-24 ENCOUNTER — Ambulatory Visit: Payer: Self-pay | Admitting: Internal Medicine

## 2013-10-15 ENCOUNTER — Encounter: Payer: Self-pay | Admitting: Internal Medicine

## 2014-01-29 ENCOUNTER — Encounter: Payer: Self-pay | Admitting: Internal Medicine

## 2014-02-03 ENCOUNTER — Other Ambulatory Visit: Payer: Self-pay | Admitting: Internal Medicine

## 2014-02-28 ENCOUNTER — Ambulatory Visit: Payer: 59 | Admitting: Internal Medicine

## 2014-03-05 ENCOUNTER — Ambulatory Visit: Payer: 59 | Admitting: Internal Medicine

## 2014-03-19 ENCOUNTER — Ambulatory Visit (INDEPENDENT_AMBULATORY_CARE_PROVIDER_SITE_OTHER): Payer: 59 | Admitting: Internal Medicine

## 2014-03-19 ENCOUNTER — Encounter: Payer: Self-pay | Admitting: Internal Medicine

## 2014-03-19 VITALS — BP 146/82 | HR 54 | Temp 98.1°F | Ht 60.25 in | Wt 171.2 lb

## 2014-03-19 DIAGNOSIS — M79672 Pain in left foot: Secondary | ICD-10-CM | POA: Insufficient documentation

## 2014-03-19 DIAGNOSIS — I10 Essential (primary) hypertension: Secondary | ICD-10-CM

## 2014-03-19 DIAGNOSIS — Z23 Encounter for immunization: Secondary | ICD-10-CM

## 2014-03-19 DIAGNOSIS — M79609 Pain in unspecified limb: Secondary | ICD-10-CM

## 2014-03-19 DIAGNOSIS — E785 Hyperlipidemia, unspecified: Secondary | ICD-10-CM

## 2014-03-19 LAB — COMPREHENSIVE METABOLIC PANEL
ALT: 17 U/L (ref 0–35)
AST: 22 U/L (ref 0–37)
Albumin: 3.9 g/dL (ref 3.5–5.2)
Alkaline Phosphatase: 50 U/L (ref 39–117)
BILIRUBIN TOTAL: 1 mg/dL (ref 0.2–1.2)
BUN: 18 mg/dL (ref 6–23)
CALCIUM: 9.7 mg/dL (ref 8.4–10.5)
CHLORIDE: 102 meq/L (ref 96–112)
CO2: 28 mEq/L (ref 19–32)
CREATININE: 0.6 mg/dL (ref 0.4–1.2)
GFR: 102.71 mL/min (ref >60.00)
GLUCOSE: 85 mg/dL (ref 70–99)
Potassium: 3.8 mEq/L (ref 3.5–5.1)
Sodium: 139 mEq/L (ref 135–145)
Total Protein: 6.5 g/dL (ref 6.0–8.3)

## 2014-03-19 LAB — MICROALBUMIN / CREATININE URINE RATIO
Creatinine,U: 75.6 mg/dL
Microalb Creat Ratio: 1.5 mg/g (ref 0.0–30.0)
Microalb, Ur: 1.1 mg/dL (ref 0.0–1.9)

## 2014-03-19 MED ORDER — LISINOPRIL 40 MG PO TABS: 40.0000 mg | ORAL_TABLET | Freq: Every day | ORAL | Status: DC

## 2014-03-19 MED ORDER — PRAVASTATIN SODIUM 40 MG PO TABS: 40.0000 mg | ORAL_TABLET | Freq: Every day | ORAL | Status: DC

## 2014-03-19 MED ORDER — HYDROCHLOROTHIAZIDE 25 MG PO TABS: 25.0000 mg | ORAL_TABLET | Freq: Every day | ORAL | Status: DC

## 2014-03-19 MED ORDER — HYDRALAZINE HCL 25 MG PO TABS: 25.0000 mg | ORAL_TABLET | Freq: Two times a day (BID) | ORAL | Status: DC

## 2014-03-19 NOTE — Assessment & Plan Note (Signed)
Left foot pain over second metatarsal and second toe after injury several months ago. Discussed option of getting xrays versus referral to podiatry. Will set up referral to podiatry for evaluation.

## 2014-03-19 NOTE — Progress Notes (Signed)
Pre visit review using our clinic review tool, if applicable. No additional management support is needed unless otherwise documented below in the visit note. 

## 2014-03-19 NOTE — Assessment & Plan Note (Signed)
Will check LFTs with labs today. Continue Pravastatin. 

## 2014-03-19 NOTE — Progress Notes (Signed)
Subjective:    Patient ID: Cheryl SchlatterRita Winters, female    DOB: 07/30/1934, 80 y.o.   MRN: [id number]  HPI 80YO female presents for follow up. Feeling well. Only concern today is increased arthralgia in her hands and pain in her left midfoot after stubbing her toe several months ago. She notes that toe appears swollen. Not taking anything for pain.  Aside from this, feeling well. Compliant with meds.    Review of Systems  Constitutional: Negative for fever, chills, appetite change, fatigue and unexpected weight change.  HENT: Negative for congestion, ear pain, sinus pressure, sore throat, trouble swallowing and voice change.   Eyes: Negative for visual disturbance.  Respiratory: Negative for cough, shortness of breath, wheezing and stridor.   Cardiovascular: Negative for chest pain, palpitations and leg swelling.  Gastrointestinal: Negative for nausea, vomiting, abdominal pain, diarrhea, constipation, blood in stool, abdominal distention and anal bleeding.  Genitourinary: Negative for dysuria and flank pain.  Musculoskeletal: Positive for arthralgias and joint swelling. Negative for gait problem, myalgias and neck pain.  Skin: Negative for color change and rash.  Neurological: Positive for tremors. Negative for dizziness and headaches.  Hematological: Negative for adenopathy. Does not bruise/bleed easily.  Psychiatric/Behavioral: Negative for suicidal ideas, sleep disturbance and dysphoric mood. The patient is not nervous/anxious.        Objective:    BP 146/82  Pulse 54  Temp(Src) 98.1 F (36.7 C) (Oral)  Ht 5' 0.25" (1.53 m)  Wt 171 lb 4 oz (77.678 kg)  BMI 33.18 kg/m2  SpO2 96% Physical Exam  Constitutional: She is oriented to person, place, and time. She appears well-developed and well-nourished. No distress.  HENT:  Head: Normocephalic and atraumatic.  Right Ear: External ear normal.  Left Ear: External ear normal.  Nose: Nose normal.  Mouth/Throat: Oropharynx is clear and  moist. No oropharyngeal exudate.  Eyes: Conjunctivae are normal. Pupils are equal, round, and reactive to light. Right eye exhibits no discharge. Left eye exhibits no discharge. No scleral icterus.  Neck: Normal range of motion. Neck supple. No tracheal deviation present. No thyromegaly present.  Cardiovascular: Normal rate, regular rhythm, normal heart sounds and intact distal pulses.  Exam reveals no gallop and no friction rub.   No murmur heard. Pulmonary/Chest: Effort normal and breath sounds normal. No accessory muscle usage. Not tachypneic. No respiratory distress. She has no decreased breath sounds. She has no wheezes. She has no rhonchi. She has no rales. She exhibits no tenderness.  Musculoskeletal: Normal range of motion. She exhibits no edema.       Left foot: She exhibits tenderness, bony tenderness and swelling.       Feet:  Lymphadenopathy:    She has no cervical adenopathy.  Neurological: She is alert and oriented to person, place, and time. No cranial nerve deficit. She exhibits normal muscle tone. Coordination normal.  Skin: Skin is warm and dry. No rash noted. She is not diaphoretic. No erythema. No pallor.  Psychiatric: She has a normal mood and affect. Her behavior is normal. Judgment and thought content normal.          Assessment & Plan:   Problem List Items Addressed This Visit     Unprioritized   Hyperlipidemia     Will check LFTs with labs today. Continue Pravastatin.    Relevant Medications      lisinopril (PRINIVIL,ZESTRIL) tablet      hydrochlorothiazide tablet      hydrALAZINE (APRESOLINE) tablet      pravastatin (  PRAVACHOL) tablet   Hypertension - Primary       BP Readings from Last 3 Encounters:  03/19/14 146/82  08/27/13 140/80  08/02/13 134/80   BP generally well controlled on current medications. Will check renal function with labs today.    Relevant Medications      lisinopril (PRINIVIL,ZESTRIL) tablet      hydrochlorothiazide tablet       hydrALAZINE (APRESOLINE) tablet      pravastatin (PRAVACHOL) tablet   Other Relevant Orders      Microalbumin / creatinine urine ratio      Comprehensive metabolic panel   Left foot pain   Relevant Orders      Ambulatory referral to Podiatry    Other Visit Diagnoses   Need for prophylactic vaccination against Streptococcus pneumoniae (pneumococcus)        Relevant Orders       Pneumococcal conjugate vaccine 13-valent (Completed)        Return in about 6 months (around 09/18/2014) for Wellness Visit.

## 2014-03-19 NOTE — Assessment & Plan Note (Signed)
  BP Readings from Last 3 Encounters:  03/19/14 146/82  08/27/13 140/80  08/02/13 134/80   BP generally well controlled on current medications. Will check renal function with labs today.

## 2014-03-20 ENCOUNTER — Telehealth: Payer: Self-pay | Admitting: Internal Medicine

## 2014-03-20 ENCOUNTER — Encounter: Payer: Self-pay | Admitting: *Deleted

## 2014-03-20 NOTE — Telephone Encounter (Signed)
Relevant patient education mailed to patient.  

## 2014-03-26 ENCOUNTER — Encounter: Payer: Self-pay | Admitting: Podiatry

## 2014-03-26 ENCOUNTER — Ambulatory Visit (INDEPENDENT_AMBULATORY_CARE_PROVIDER_SITE_OTHER): Payer: Medicare Other | Admitting: Podiatry

## 2014-03-26 ENCOUNTER — Ambulatory Visit (INDEPENDENT_AMBULATORY_CARE_PROVIDER_SITE_OTHER): Payer: Medicare Other

## 2014-03-26 VITALS — BP 115/56 | HR 61 | Resp 16 | Ht 60.0 in | Wt 171.0 lb

## 2014-03-26 DIAGNOSIS — S92919A Unspecified fracture of unspecified toe(s), initial encounter for closed fracture: Secondary | ICD-10-CM

## 2014-03-26 DIAGNOSIS — M779 Enthesopathy, unspecified: Secondary | ICD-10-CM

## 2014-03-26 NOTE — Progress Notes (Signed)
   Subjective:    Patient ID: Cheryl Winters, female    DOB: [birth year], 80 y.o.   MRN: [id number]  HPI Comments: i jammed my 2nd toe on my left foot 1 month ago. It is painful and it wont go back down. It hurts to walk. i havent done anything for it.  Foot Pain      Review of Systems  Eyes: Positive for itching.  Cardiovascular: Positive for leg swelling.  Musculoskeletal:       Difficulty walking  Allergic/Immunologic: Positive for environmental allergies.  All other systems reviewed and are negative.      Objective:   Physical Exam        Assessment & Plan:

## 2014-03-26 NOTE — Progress Notes (Signed)
Subjective:     Patient ID: Cheryl Winters, female   DOB: [birth year], 80 y.o.   MRN: [id number]  Foot Pain   patient presents stating I traumatized my second toe on my left foot around a month ago and it has stayed very swollen   Review of Systems  All other systems reviewed and are negative.      Objective:   Physical Exam  Nursing note and vitals reviewed. Cardiovascular: Intact distal pulses.   Musculoskeletal: Normal range of motion.  Neurological: She is alert.  Skin: Skin is warm.   neurovascular status is found to be intact with edema around the second metatarsal and second digit with elevation of the toe and significant discomfort upon compression of this area. Patient's muscle strength is adequate and range of motion of the subtalar and midtarsal joint is within normal limits     Assessment:     Probable trauma with fracture of the second digit left with inflammatory changes associated with this    Plan:     H&P and x-rays reviewed with patient. I went ahead today and I dispense Darco shoe and instructed that we may have to put medicine into the joint it is not improved but due to the fact there is a fracture we will await 8 weeks before considering this. If symptoms continue reappoint

## 2014-04-23 ENCOUNTER — Ambulatory Visit: Payer: Self-pay | Admitting: Adult Health

## 2014-04-23 ENCOUNTER — Telehealth: Payer: Self-pay | Admitting: Adult Health

## 2014-04-23 ENCOUNTER — Ambulatory Visit (INDEPENDENT_AMBULATORY_CARE_PROVIDER_SITE_OTHER): Payer: 59 | Admitting: Adult Health

## 2014-04-23 ENCOUNTER — Encounter: Payer: Self-pay | Admitting: Adult Health

## 2014-04-23 VITALS — BP 148/92 | HR 60 | Temp 98.0°F | Resp 18 | Ht 60.25 in | Wt 174.5 lb

## 2014-04-23 DIAGNOSIS — M7989 Other specified soft tissue disorders: Secondary | ICD-10-CM

## 2014-04-23 NOTE — Progress Notes (Signed)
Pre visit review using our clinic review tool, if applicable. No additional management support is needed unless otherwise documented below in the visit note. 

## 2014-04-23 NOTE — Progress Notes (Signed)
Patient ID: Cheryl Winters, female   DOB: 1934-06-22, 80 y.o.   MRN: [id number]   Subjective:    Patient ID: Cheryl LoganRita Steele Frediani, female    DOB: 1934-06-22, 80 y.o.   MRN: [id number]  HPI  Pt is a pleasant 80 y/o female who presents to clinic with LLE swelling. Pt is s/p left knee replacement ~ 2 years ago. Reports hx of effusion of the knee. She reports that swelling is worse at the end of the day. She is also experiencing pain. She has not taken anything to reduce the pain. She has not applied ice or elevated her extremity. Denies numbness or tingling of the extremity.   Past Medical History  Diagnosis Date  . Hyperlipidemia   . Ulnar nerve lesion   . Vitamin D deficiency   . HTN (hypertension)   . Carpal tunnel syndrome   . GERD (gastroesophageal reflux disease)   . Benign essential tremor   . DJD (degenerative joint disease) of knee   . Abnormal stress test     w/fixed defect  . Abdominal pain, right upper quadrant       Current Outpatient Prescriptions on File Prior to Visit  Medication Sig Dispense Refill  . Biotin 5 MG CAPS Take 5 mg by mouth daily.      . calcium carbonate (OS-CAL - DOSED IN MG OF ELEMENTAL CALCIUM) 1250 MG tablet Take 1 tablet by mouth daily.      . cholecalciferol (VITAMIN D) 1000 UNITS tablet Take 1,000 Units by mouth daily.      . fish oil-omega-3 fatty acids 1000 MG capsule Take 2 g by mouth daily.      . hydrALAZINE (APRESOLINE) 25 MG tablet Take 1 tablet (25 mg total) by mouth 2 (two) times daily.  180 tablet  3  . hydrochlorothiazide (HYDRODIURIL) 25 MG tablet Take 1 tablet (25 mg total) by mouth daily.  90 tablet  3  . lisinopril (PRINIVIL,ZESTRIL) 40 MG tablet Take 1 tablet (40 mg total) by mouth daily.  90 tablet  3  . Multiple Vitamins-Minerals (MULTIVITAMIN WITH MINERALS) tablet Take 1 tablet by mouth daily.      Marland Kitchen. nystatin (MYCOSTATIN) powder APPLY TOPICALLY 4 (FOUR) TIMES DAILY.  15 g  0  . pravastatin (PRAVACHOL) 40 MG tablet Take 1 tablet  (40 mg total) by mouth daily.  90 tablet  3  . propranolol (INDERAL) 40 MG tablet Take 40 mg by mouth 2 (two) times daily.       No current facility-administered medications on file prior to visit.     Review of Systems  Constitutional: Negative for fever and chills.  Respiratory: Negative for shortness of breath.   Musculoskeletal:       LLE swelling and pain  Skin: Negative for rash and wound.  Neurological: Negative for weakness and numbness.  Psychiatric/Behavioral: Negative.   All other systems reviewed and are negative.      Objective:  BP 148/92  Pulse 60  Temp(Src) 98 F (36.7 C) (Oral)  Resp 18  Ht 5' 0.25" (1.53 m)  Wt 174 lb 8 oz (79.153 kg)  BMI 33.81 kg/m2  SpO2 97%   Physical Exam  Constitutional: She is oriented to person, place, and time. No distress.  Cardiovascular: Normal rate and regular rhythm.   Pulmonary/Chest: Effort normal. No respiratory distress.  Musculoskeletal: She exhibits edema and tenderness.  LLE is edematous. Painful left knee to touch.  Neurological: She is alert and oriented to person, place,  and time.  Skin: Skin is warm and dry.  Psychiatric: She has a normal mood and affect. Her behavior is normal. Judgment and thought content normal.      Assessment & Plan:   1. Swelling of limb Swelling is concerning for DVT. I am sending her to have an ultrasound to rule this out. If no DVT is found, then she will need to elevate extremity, apply ice to help reduce swelling and, if no improvement, then refer back to ortho for further evaluation and recommendations. - US Venous Img Lower Unilateral Left; Future

## 2014-04-23 NOTE — Telephone Encounter (Signed)
The ultrasound did not show any blood clots. Please have her elevate her leg as much as possible for the next week. Apply ice to her knee 3-4 times daily for 15-20 minutes. If the swelling does not improve, I will refer her to ortho for further evaluation.

## 2014-04-24 ENCOUNTER — Telehealth: Payer: Self-pay | Admitting: Internal Medicine

## 2014-04-24 NOTE — Telephone Encounter (Signed)
Spoke with patient and notified her of Raquel's comments. Patient verbalized understanding.  

## 2014-04-24 NOTE — Telephone Encounter (Signed)
Called patient no answer, left voicemail on home phone asking patient to call the office.

## 2014-04-24 NOTE — Telephone Encounter (Signed)
Pt returned call, left vm asking for return call.

## 2014-04-24 NOTE — Telephone Encounter (Signed)
Spoke with patient.

## 2014-05-01 ENCOUNTER — Encounter: Payer: Self-pay | Admitting: Adult Health

## 2014-07-22 ENCOUNTER — Ambulatory Visit (INDEPENDENT_AMBULATORY_CARE_PROVIDER_SITE_OTHER): Payer: 59 | Admitting: Internal Medicine

## 2014-07-22 ENCOUNTER — Encounter: Payer: Self-pay | Admitting: *Deleted

## 2014-07-22 ENCOUNTER — Encounter: Payer: Self-pay | Admitting: Internal Medicine

## 2014-07-22 VITALS — BP 154/80 | HR 53 | Temp 98.3°F | Ht 60.25 in | Wt 174.5 lb

## 2014-07-22 DIAGNOSIS — M7989 Other specified soft tissue disorders: Secondary | ICD-10-CM | POA: Insufficient documentation

## 2014-07-22 DIAGNOSIS — I10 Essential (primary) hypertension: Secondary | ICD-10-CM

## 2014-07-22 MED ORDER — AMLODIPINE BESYLATE 2.5 MG PO TABS: 2.5000 mg | ORAL_TABLET | Freq: Every day | ORAL | Status: DC

## 2014-07-22 NOTE — Progress Notes (Signed)
Pre visit review using our clinic review tool, if applicable. No additional management support is needed unless otherwise documented below in the visit note. 

## 2014-07-22 NOTE — Assessment & Plan Note (Signed)
Chronic left leg swelling. Suspect chronic venous insufficiency. Reviewed recent labs from Dr. Ernest PineHooten showing normal sed rate, CRP, and WBC. Per pt, referral to vascular is in process. Will follow up in 4 weeks.

## 2014-07-22 NOTE — Patient Instructions (Signed)
Start Amlodipine 2.5mg daily.  Follow up in 4 weeks. 

## 2014-07-22 NOTE — Assessment & Plan Note (Signed)
BP Readings from Last 3 Encounters:  07/22/14 154/80  04/23/14 148/92  03/26/14 115/56   BP continues to be slightly elevated. Will add Amlodipine 2.5mg  daily. Continue Lisinopril, HCTZ, Propranolol.

## 2014-07-22 NOTE — Progress Notes (Signed)
Subjective:    Patient ID: Cheryl Winters, female    DOB: Sep 03, 1934, 80 y.o.   MRN: [id number]  HPI 80YO female presents for follow up.  HTN - BP was noted to be elevated on health nurse check at home. Unsure reading.No recent chest pain or headache. Compliant with medications.  Recently seen by Dr. Ernest PineHooten for left leg swelling. Left leg has been swollen chronically for several months. Previous US LLE was negative for DVT. Per pt, plan is for evaluation with vascular for chronic venous insufficiency.  Review of Systems  Constitutional: Negative for fever, chills, appetite change, fatigue and unexpected weight change.  Eyes: Negative for visual disturbance.  Respiratory: Negative for shortness of breath.   Cardiovascular: Positive for leg swelling. Negative for chest pain and palpitations.  Gastrointestinal: Negative for abdominal pain.  Musculoskeletal: Negative for arthralgias and myalgias.  Skin: Negative for color change and rash.  Neurological: Negative for headaches.  Hematological: Negative for adenopathy. Does not bruise/bleed easily.  Psychiatric/Behavioral: Negative for dysphoric mood. The patient is not nervous/anxious.        Objective:    BP 154/80  Pulse 53  Temp(Src) 98.3 F (36.8 C) (Oral)  Ht 5' 0.25" (1.53 m)  Wt 174 lb 8 oz (79.153 kg)  BMI 33.81 kg/m2  SpO2 95% Physical Exam  Constitutional: She is oriented to person, place, and time. She appears well-developed and well-nourished. No distress.  HENT:  Head: Normocephalic and atraumatic.  Right Ear: External ear normal.  Left Ear: External ear normal.  Nose: Nose normal.  Mouth/Throat: Oropharynx is clear and moist. No oropharyngeal exudate.  Eyes: Conjunctivae are normal. Pupils are equal, round, and reactive to light. Right eye exhibits no discharge. Left eye exhibits no discharge. No scleral icterus.  Neck: Normal range of motion. Neck supple. No tracheal deviation present. No thyromegaly present.   Cardiovascular: Normal rate, regular rhythm, normal heart sounds and intact distal pulses.  Exam reveals no gallop and no friction rub.   No murmur heard. Pulmonary/Chest: Effort normal and breath sounds normal. No accessory muscle usage. Not tachypneic. No respiratory distress. She has no decreased breath sounds. She has no wheezes. She has no rhonchi. She has no rales. She exhibits no tenderness.  Musculoskeletal: Normal range of motion. She exhibits edema (left lower leg to just below knee with mild erythema distal left leg. No induration). She exhibits no tenderness.  Lymphadenopathy:    She has no cervical adenopathy.  Neurological: She is alert and oriented to person, place, and time. No cranial nerve deficit. She exhibits normal muscle tone. Coordination normal.  Skin: Skin is warm and dry. No rash noted. She is not diaphoretic. No erythema. No pallor.  Psychiatric: She has a normal mood and affect. Her behavior is normal. Judgment and thought content normal.          Assessment & Plan:   Problem List Items Addressed This Visit     Unprioritized   Hypertension - Primary      BP Readings from Last 3 Encounters:  07/22/14 154/80  04/23/14 148/92  03/26/14 115/56   BP continues to be slightly elevated. Will add Amlodipine 2.5mg  daily. Continue Lisinopril, HCTZ, Propranolol.    Relevant Medications      amLODIpine (NORVASC)  tablet   Left leg swelling     Chronic left leg swelling. Suspect chronic venous insufficiency. Reviewed recent labs from Dr. Ernest PineHooten showing normal sed rate, CRP, and WBC. Per pt, referral to vascular is in  process. Will follow up in 4 weeks.        Return in about 4 weeks (around 08/19/2014) for Recheck of Blood Pressure.

## 2014-07-31 ENCOUNTER — Ambulatory Visit: Payer: Self-pay | Admitting: General Practice

## 2014-08-02 LAB — HM COLONOSCOPY: HM COLON: NORMAL

## 2014-08-05 ENCOUNTER — Ambulatory Visit: Payer: Self-pay | Admitting: Gastroenterology

## 2014-09-02 ENCOUNTER — Encounter: Payer: Self-pay | Admitting: Internal Medicine

## 2014-09-02 ENCOUNTER — Ambulatory Visit (INDEPENDENT_AMBULATORY_CARE_PROVIDER_SITE_OTHER): Payer: Medicare Other | Admitting: Internal Medicine

## 2014-09-02 ENCOUNTER — Telehealth: Payer: Self-pay | Admitting: Internal Medicine

## 2014-09-02 VITALS — BP 134/72 | HR 56 | Temp 98.1°F | Ht 60.5 in | Wt 175.0 lb

## 2014-09-02 DIAGNOSIS — Z1239 Encounter for other screening for malignant neoplasm of breast: Secondary | ICD-10-CM

## 2014-09-02 DIAGNOSIS — Z7189 Other specified counseling: Secondary | ICD-10-CM | POA: Insufficient documentation

## 2014-09-02 DIAGNOSIS — Z0000 Encounter for general adult medical examination without abnormal findings: Secondary | ICD-10-CM

## 2014-09-02 DIAGNOSIS — E785 Hyperlipidemia, unspecified: Secondary | ICD-10-CM

## 2014-09-02 DIAGNOSIS — I10 Essential (primary) hypertension: Secondary | ICD-10-CM

## 2014-09-02 LAB — CBC WITH DIFFERENTIAL/PLATELET
BASOS ABS: 0 10*3/uL (ref 0.0–0.1)
Basophils Relative: 0.5 % (ref 0.0–3.0)
Eosinophils Absolute: 0.1 10*3/uL (ref 0.0–0.7)
Eosinophils Relative: 2.5 % (ref 0.0–5.0)
HCT: 38.3 % (ref 36.0–46.0)
HEMOGLOBIN: 12.6 g/dL (ref 12.0–15.0)
LYMPHS ABS: 1.5 10*3/uL (ref 0.7–4.0)
Lymphocytes Relative: 30.2 % (ref 12.0–46.0)
MCHC: 32.9 g/dL (ref 30.0–36.0)
MCV: 91.4 fl (ref 78.0–100.0)
MONO ABS: 0.3 10*3/uL (ref 0.1–1.0)
MONOS PCT: 6.6 % (ref 3.0–12.0)
NEUTROS ABS: 2.9 10*3/uL (ref 1.4–7.7)
Neutrophils Relative %: 60.2 % (ref 43.0–77.0)
PLATELETS: 183 10*3/uL (ref 150.0–400.0)
RBC: 4.2 Mil/uL (ref 3.87–5.11)
RDW: 13.7 % (ref 11.5–15.5)
WBC: 4.8 10*3/uL (ref 4.0–10.5)

## 2014-09-02 LAB — COMPREHENSIVE METABOLIC PANEL
ALK PHOS: 49 U/L (ref 39–117)
ALT: 16 U/L (ref 0–35)
AST: 20 U/L (ref 0–37)
Albumin: 4 g/dL (ref 3.5–5.2)
BILIRUBIN TOTAL: 0.8 mg/dL (ref 0.2–1.2)
BUN: 20 mg/dL (ref 6–23)
CO2: 26 meq/L (ref 19–32)
CREATININE: 0.7 mg/dL (ref 0.4–1.2)
Calcium: 9.6 mg/dL (ref 8.4–10.5)
Chloride: 106 mEq/L (ref 96–112)
GFR: 90.33 mL/min (ref >60.00)
Glucose, Bld: 89 mg/dL (ref 70–99)
Potassium: 4.5 mEq/L (ref 3.5–5.1)
Sodium: 143 mEq/L (ref 135–145)
Total Protein: 6.9 g/dL (ref 6.0–8.3)

## 2014-09-02 LAB — LIPID PANEL
CHOLESTEROL: 198 mg/dL (ref 0–200)
HDL: 61.8 mg/dL (ref >39.00)
LDL Cholesterol: 126 mg/dL — ABNORMAL HIGH (ref 0–99)
NonHDL: 136.2
TRIGLYCERIDES: 50 mg/dL (ref 0.0–149.0)
Total CHOL/HDL Ratio: 3
VLDL: 10 mg/dL (ref 0.0–40.0)

## 2014-09-02 LAB — MICROALBUMIN / CREATININE URINE RATIO
CREATININE, U: 44.9 mg/dL
MICROALB/CREAT RATIO: 0.7 mg/g (ref 0.0–30.0)
Microalb, Ur: 0.3 mg/dL (ref 0.0–1.9)

## 2014-09-02 MED ORDER — HYDRALAZINE HCL 25 MG PO TABS: 25.0000 mg | ORAL_TABLET | Freq: Two times a day (BID) | ORAL | Status: DC

## 2014-09-02 MED ORDER — HYDROCHLOROTHIAZIDE 25 MG PO TABS: 25.0000 mg | ORAL_TABLET | Freq: Every day | ORAL | Status: DC

## 2014-09-02 MED ORDER — LISINOPRIL 40 MG PO TABS: 40.0000 mg | ORAL_TABLET | Freq: Every day | ORAL | Status: DC

## 2014-09-02 MED ORDER — PROPRANOLOL HCL 40 MG PO TABS: 40.0000 mg | ORAL_TABLET | Freq: Two times a day (BID) | ORAL | Status: DC

## 2014-09-02 MED ORDER — AMLODIPINE BESYLATE 2.5 MG PO TABS: 2.5000 mg | ORAL_TABLET | Freq: Every day | ORAL | Status: DC

## 2014-09-02 MED ORDER — PRAVASTATIN SODIUM 40 MG PO TABS: 40.0000 mg | ORAL_TABLET | Freq: Every day | ORAL | Status: DC

## 2014-09-02 NOTE — Assessment & Plan Note (Signed)
Lipids with labs today. Continue Pravastatin. 

## 2014-09-02 NOTE — Assessment & Plan Note (Signed)
General medical exam normal today including breast exam. PAP and pelvic deferred given age an preference. Immunizations are UTD. Colonoscopy UTD and reviewed. Will schedule mammogram. Labs today including CBC, CMP, lipids.

## 2014-09-02 NOTE — Progress Notes (Signed)
Pre visit review using our clinic review tool, if applicable. No additional management support is needed unless otherwise documented below in the visit note. 

## 2014-09-02 NOTE — Progress Notes (Signed)
Subjective:    Patient ID: Cheryl LoganRita Steele Calkin, female    DOB: 1934/06/18, 80 y.o.   MRN: [id number]  HPI The patient is here for annual Medicare wellness examination and management of other chronic and acute problems.   The risk factors are reflected in the social history.  The roster of all physicians providing medical care to patient - is listed in the Snapshot section of the chart.  Activities of daily living:  The patient is 100% independent in all ADLs: dressing, toileting, feeding as well as independent mobility. [place]  Home safety : The patient has smoke detectors in the home. They wear seatbelts.  There are no firearms at home. There is no violence in the home.   There is no risks for hepatitis, STDs or HIV. There is no history of blood transfusion. They have no travel history to infectious disease endemic areas of the world.  The patient has not seen their dentist in the last six month. (no dentist, wears dentures) They have not seen their eye doctor in the last year. [facility] past, [facility] No issues with hearing. They have deferred audiologic testing in the last year.   They do not  have excessive sun exposure. Discussed the need for sun protection: hats, long sleeves and use of sunscreen if there is significant sun exposure. Dermatologist - Dr. Adolphus Birchwoodasher  Diet: the importance of a healthy diet is discussed. They do have a healthy diet.  The benefits of regular aerobic exercise were discussed. She walks occasionally.  Depression screen: there are no signs or vegative symptoms of depression- irritability, change in appetite, anhedonia, sadness/tearfullness.  Cognitive assessment: the patient manages all their financial and personal affairs and is actively engaged. They could relate day,date,year and events.  HCPOA -daughters, Norva KarvonenJayme Williams, Edlene Martin.  The following portions of the patient's history were reviewed  and updated as appropriate: allergies, current medications, past family history, past medical history,  past surgical history, past social history  and problem list.  Visual acuity was not assessed per patient preference since she has regular follow up with her ophthalmologist. Hearing and body mass index were assessed and reviewed.   Review of Systems  Constitutional: Negative for fever, chills, appetite change, fatigue and unexpected weight change.  Eyes: Negative for visual disturbance.  Respiratory: Negative for shortness of breath.   Cardiovascular: Negative for chest pain and leg swelling.  Gastrointestinal: Negative for nausea, vomiting, abdominal pain, diarrhea, constipation and blood in stool.  Musculoskeletal: Negative for myalgias, arthralgias and gait problem.  Skin: Negative for color change and rash.  Hematological: Negative for adenopathy. Does not bruise/bleed easily.  Psychiatric/Behavioral: Negative for sleep disturbance and dysphoric mood. The patient is not nervous/anxious.        Objective:    BP 134/72 mmHg  Pulse 56  Temp(Src) 98.1 F (36.7 C) (Oral)  Ht 5' 0.5" (1.537 m)  Wt 175 lb (79.379 kg)  BMI 33.60 kg/m2  SpO2 95% Physical Exam  Constitutional: She is oriented to person, place, and time. She appears well-developed and well-nourished. No distress.  HENT:  Head: Normocephalic and atraumatic.  Right Ear: External ear normal.  Left Ear: External ear normal.  Nose: Nose normal.  Mouth/Throat: Oropharynx is clear and moist. No oropharyngeal exudate.  Eyes: Conjunctivae are normal. Pupils are equal, round, and reactive to light. Right eye exhibits no discharge. Left eye exhibits no discharge. No scleral icterus.  Neck: Normal  range of motion. Neck supple. No tracheal deviation present. No thyromegaly present.  Cardiovascular: Normal rate, regular rhythm, normal heart sounds and intact distal pulses.  Exam reveals no gallop and no friction rub.   No murmur  heard. Pulmonary/Chest: Effort normal and breath sounds normal. No accessory muscle usage. No tachypnea. No respiratory distress. She has no decreased breath sounds. She has no wheezes. She has no rales. She exhibits no tenderness. Right breast exhibits no inverted nipple, no mass, no nipple discharge, no skin change and no tenderness. Left breast exhibits no inverted nipple, no mass, no nipple discharge, no skin change and no tenderness. Breasts are symmetrical.  Abdominal: Soft. Bowel sounds are normal. She exhibits no distension and no mass. There is no tenderness. There is no rebound and no guarding.  Musculoskeletal: Normal range of motion. She exhibits no edema or tenderness.  Lymphadenopathy:    She has no cervical adenopathy.  Neurological: She is alert and oriented to person, place, and time. No cranial nerve deficit. She exhibits normal muscle tone. Coordination normal.  Skin: Skin is warm and dry. No rash noted. She is not diaphoretic. No erythema. No pallor.  Psychiatric: She has a normal mood and affect. Her behavior is normal. Judgment and thought content normal.          Assessment & Plan:   Problem List Items Addressed This Visit      Unprioritized   Hyperlipidemia    Lipids with labs today. Continue Pravastatin.    Relevant Medications      propranolol (INDERAL) IR tablet      pravastatin (PRAVACHOL) tablet      lisinopril (PRINIVIL,ZESTRIL) tablet      amLODIpine (NORVASC)  tablet      hydrALAZINE (APRESOLINE) tablet      hydrochlorothiazide tablet   Hypertension    BP Readings from Last 3 Encounters:  09/02/14 134/72  07/22/14 154/80  04/23/14 148/92   BP well controlled. Continue current medications. Renal function with labs today.    Relevant Medications      propranolol (INDERAL) IR tablet      pravastatin (PRAVACHOL) tablet      lisinopril (PRINIVIL,ZESTRIL) tablet      amLODIpine (NORVASC)  tablet      hydrALAZINE (APRESOLINE) tablet       hydrochlorothiazide tablet   Medicare annual wellness visit, subsequent - Primary    General medical exam normal today including breast exam. PAP and pelvic deferred given age an preference. Immunizations are UTD. Colonoscopy UTD and reviewed. Will schedule mammogram. Labs today including CBC, CMP, lipids.    Relevant Orders      CBC with Differential      Comprehensive metabolic panel      Lipid panel      Microalbumin / creatinine urine ratio   Screening for breast cancer   Relevant Orders      MM Digital Screening       No Follow-up on file.

## 2014-09-02 NOTE — Patient Instructions (Signed)

## 2014-09-02 NOTE — Telephone Encounter (Signed)
Please advise if the pt needs the 12/2 appt/msn

## 2014-09-02 NOTE — Assessment & Plan Note (Signed)
BP Readings from Last 3 Encounters:  09/02/14 134/72  07/22/14 154/80  04/23/14 148/92   BP well controlled. Continue current medications. Renal function with labs today.

## 2014-09-03 ENCOUNTER — Encounter: Payer: Self-pay | Admitting: *Deleted

## 2014-09-03 NOTE — Telephone Encounter (Signed)
Pt had medicare wellness this week, she has an apt scheduled for 12/2 for medicare wellness.  Just wanted to make sure you didn't need her to keep this apt., I didn't see anything that would make me think she would need it.

## 2014-09-03 NOTE — Telephone Encounter (Signed)
Yes, we can cancel

## 2014-09-11 ENCOUNTER — Encounter: Payer: Self-pay | Admitting: Internal Medicine

## 2014-09-11 LAB — CBC WITH DIFFERENTIAL/PLATELET
BASOS ABS: 0 10*3/uL (ref 0.0–0.1)
BASOS PCT: 0.5 %
EOS ABS: 0.2 10*3/uL (ref 0.0–0.7)
Eosinophil %: 2.7 %
HCT: 37.5 % (ref 35.0–47.0)
HGB: 12.6 g/dL (ref 12.0–16.0)
Lymphocyte #: 2.1 10*3/uL (ref 1.0–3.6)
Lymphocyte %: 29.8 %
MCH: 30.8 pg (ref 26.0–34.0)
MCHC: 33.6 g/dL (ref 32.0–36.0)
MCV: 92 fL (ref 80–100)
Monocyte #: 0.6 x10 3/mm (ref 0.2–0.9)
Monocyte %: 8.2 %
NEUTROS ABS: 4.1 10*3/uL (ref 1.4–6.5)
Neutrophil %: 58.8 %
PLATELETS: 189 10*3/uL (ref 150–440)
RBC: 4.09 10*6/uL (ref 3.80–5.20)
RDW: 13.7 % (ref 11.5–14.5)
WBC: 6.9 10*3/uL (ref 3.6–11.0)

## 2014-09-11 LAB — HEPATIC FUNCTION PANEL A (ARMC)
ALBUMIN: 3.6 g/dL (ref 3.4–5.0)
ALK PHOS: 64 U/L
Bilirubin, Direct: 0.3 mg/dL — ABNORMAL HIGH (ref 0.0–0.2)
Bilirubin,Total: 0.7 mg/dL (ref 0.2–1.0)
SGOT(AST): 28 U/L (ref 15–37)
SGPT (ALT): 20 U/L
TOTAL PROTEIN: 6.9 g/dL (ref 6.4–8.2)

## 2014-09-11 LAB — BASIC METABOLIC PANEL
Anion Gap: 6 — ABNORMAL LOW (ref 7–16)
BUN: 24 mg/dL — ABNORMAL HIGH (ref 7–18)
CALCIUM: 9.2 mg/dL (ref 8.5–10.1)
CO2: 30 mmol/L (ref 21–32)
CREATININE: 0.94 mg/dL (ref 0.60–1.30)
Chloride: 105 mmol/L (ref 98–107)
EGFR (African American): >60
Glucose: 119 mg/dL — ABNORMAL HIGH (ref 65–99)
Osmolality: 286 (ref 275–301)
Potassium: 3.5 mmol/L (ref 3.5–5.1)
SODIUM: 141 mmol/L (ref 136–145)

## 2014-09-11 LAB — URINALYSIS, COMPLETE
BILIRUBIN, UR: NEGATIVE
Blood: NEGATIVE
Glucose,UR: NEGATIVE mg/dL (ref 0–75)
KETONE: NEGATIVE
Nitrite: NEGATIVE
Ph: 7 (ref 4.5–8.0)
Protein: NEGATIVE
Specific Gravity: 1.005 (ref 1.003–1.030)
Squamous Epithelial: 1

## 2014-09-11 LAB — TROPONIN I

## 2014-09-12 ENCOUNTER — Observation Stay: Payer: Self-pay | Admitting: Internal Medicine

## 2014-09-12 DIAGNOSIS — R55 Syncope and collapse: Secondary | ICD-10-CM

## 2014-09-12 DIAGNOSIS — I340 Nonrheumatic mitral (valve) insufficiency: Secondary | ICD-10-CM

## 2014-09-12 LAB — TSH: THYROID STIMULATING HORM: 2.66 u[IU]/mL

## 2014-09-12 LAB — TROPONIN I
Troponin-I: <0.02 ng/mL
Troponin-I: <0.02 ng/mL

## 2014-09-12 LAB — CK-MB
CK-MB: 1.7 ng/mL (ref 0.5–3.6)
CK-MB: 2 ng/mL (ref 0.5–3.6)

## 2014-09-13 LAB — BASIC METABOLIC PANEL
ANION GAP: 5 — AB (ref 7–16)
BUN: 22 mg/dL — AB (ref 7–18)
CHLORIDE: 105 mmol/L (ref 98–107)
Calcium, Total: 9.3 mg/dL (ref 8.5–10.1)
Co2: 31 mmol/L (ref 21–32)
Creatinine: 0.9 mg/dL (ref 0.60–1.30)
EGFR (African American): >60
Glucose: 90 mg/dL (ref 65–99)
Osmolality: 284 (ref 275–301)
POTASSIUM: 3.5 mmol/L (ref 3.5–5.1)
Sodium: 141 mmol/L (ref 136–145)

## 2014-09-16 ENCOUNTER — Telehealth: Payer: Self-pay | Admitting: Internal Medicine

## 2014-09-16 LAB — CULTURE, BLOOD (SINGLE)

## 2014-09-16 NOTE — Telephone Encounter (Signed)
Pt stopped by the office to give hospital records. Pt was admitted 2 days ago for bradycardia and hypotension related to medication. Records in Dr. Dan HumphreysWalker box. Please advise where to add to schedule/msn

## 2014-09-17 ENCOUNTER — Telehealth: Payer: Self-pay | Admitting: *Deleted

## 2014-09-17 NOTE — Telephone Encounter (Signed)
Pt stopped by the office to find out about her hospital follow visit. Please advise/msn

## 2014-09-17 NOTE — Telephone Encounter (Signed)
Cheryl FanningJulie have you scheduled this?

## 2014-09-17 NOTE — Telephone Encounter (Signed)
Patient called today asking when her follow up appointment is for this week.

## 2014-09-17 NOTE — Telephone Encounter (Signed)
Discharge Date: 09/13/14  Transition Care Management Follow-up Telephone Call  How have you been since you were released from the hospital? Pt states "pretty good"    Do you understand why you were in the hospital? YES   Do you understand the discharge instrcutions? YES  Items Reviewed:  Medications reviewed: YES  Allergies reviewed: NO  Dietary changes reviewed: NO  Referrals reviewed: N/A   Functional Questionnaire:   Activities of Daily Living (ADLs):   She states they are independent in the following:  States they require assistance with the following:    Any transportation issues/concerns?: NO   Any patient concerns? NO   Confirmed importance and date/time of follow-up visits scheduled: YES, 09/19/14 @ 1:00   Confirmed with patient if condition begins to worsen call PCP or go to the ER.  Patient was given the Call-a-Nurse line [phone]: YES

## 2014-09-18 ENCOUNTER — Ambulatory Visit: Payer: 59 | Admitting: Internal Medicine

## 2014-09-18 NOTE — Telephone Encounter (Signed)
Per Dr Dan HumphreysWalker, Please add pt to schedule on thurs dec 3 at 1:00 for a hospital follow up

## 2014-09-19 ENCOUNTER — Ambulatory Visit (INDEPENDENT_AMBULATORY_CARE_PROVIDER_SITE_OTHER): Payer: Medicare Other | Admitting: Internal Medicine

## 2014-09-19 ENCOUNTER — Encounter: Payer: Self-pay | Admitting: Internal Medicine

## 2014-09-19 VITALS — BP 143/80 | HR 53 | Temp 97.6°F | Ht 60.5 in | Wt 174.2 lb

## 2014-09-19 DIAGNOSIS — I10 Essential (primary) hypertension: Secondary | ICD-10-CM

## 2014-09-19 DIAGNOSIS — R001 Bradycardia, unspecified: Secondary | ICD-10-CM | POA: Insufficient documentation

## 2014-09-19 DIAGNOSIS — E785 Hyperlipidemia, unspecified: Secondary | ICD-10-CM

## 2014-09-19 DIAGNOSIS — R55 Syncope and collapse: Secondary | ICD-10-CM | POA: Insufficient documentation

## 2014-09-19 LAB — CBC WITH DIFFERENTIAL/PLATELET
Basophils Absolute: 0 10*3/uL (ref 0.0–0.1)
Basophils Relative: 0.6 % (ref 0.0–3.0)
EOS ABS: 0.2 10*3/uL (ref 0.0–0.7)
EOS PCT: 4.2 % (ref 0.0–5.0)
HEMATOCRIT: 37.2 % (ref 36.0–46.0)
Hemoglobin: 12.1 g/dL (ref 12.0–15.0)
LYMPHS ABS: 1.8 10*3/uL (ref 0.7–4.0)
Lymphocytes Relative: 31.5 % (ref 12.0–46.0)
MCHC: 32.6 g/dL (ref 30.0–36.0)
MCV: 91.2 fl (ref 78.0–100.0)
Monocytes Absolute: 0.4 10*3/uL (ref 0.1–1.0)
Monocytes Relative: 7.1 % (ref 3.0–12.0)
Neutro Abs: 3.2 10*3/uL (ref 1.4–7.7)
Neutrophils Relative %: 56.6 % (ref 43.0–77.0)
Platelets: 175 10*3/uL (ref 150.0–400.0)
RBC: 4.07 Mil/uL (ref 3.87–5.11)
RDW: 13.8 % (ref 11.5–15.5)
WBC: 5.7 10*3/uL (ref 4.0–10.5)

## 2014-09-19 LAB — POCT URINALYSIS DIPSTICK
Bilirubin, UA: NEGATIVE
Glucose, UA: NEGATIVE
Ketones, UA: NEGATIVE
LEUKOCYTES UA: NEGATIVE
NITRITE UA: NEGATIVE
PROTEIN UA: NEGATIVE
Spec Grav, UA: 1.02
UROBILINOGEN UA: 0.2
pH, UA: 6.5

## 2014-09-19 MED ORDER — PROPRANOLOL HCL ER 60 MG PO CP24: 60.0000 mg | ORAL_CAPSULE | Freq: Every day | ORAL | Status: DC

## 2014-09-19 NOTE — Assessment & Plan Note (Signed)
Pt daughter would like her mother off statin medications. Given her age, we discussed pros and cons of statin use. Will stop the medication.

## 2014-09-19 NOTE — Progress Notes (Signed)
Pre visit review using our clinic review tool, if applicable. No additional management support is needed unless otherwise documented below in the visit note. 

## 2014-09-19 NOTE — Patient Instructions (Signed)
Stop Pravastatin.  We will set up an evaluation with cardiology.  Labs today.

## 2014-09-19 NOTE — Progress Notes (Signed)
Subjective:    Patient ID: Cheryl Winters, female    DOB: 18-Jul-1934, 80 y.o.   MRN: [id number]  HPI 80YO female presents for hospital follow up.  ADMITTED: 09/12/2014 DISCHARGED: 09/13/2014  DIAGNOSIS: Syncope, bradycardia, UTI  Felt poorly for several days. Ate a hot pocket dinner and started to feel "swimmy headed." Walked to kitchen then fainted in kitchen. Woke some time later on kitchen floor. No injury during fall. No vomiting or urinary incontinence. Daughter took her to [facility] ED.  CT head normal. ECHO normal. Found to have bradycardia and UTI. Treated with Levaquin and discharged on 3 days Levaquin, which she completed. No urinary symptoms since discharge. No fever, chills.  Stopped Hydralazine and decreased dose on Propranolol.  BP at home has been near 140s/80s.  Feeling well in general. No lightheadedness. No chest pain.   Daughter who is with her today, would like her off statin medication.  Review of Systems  Constitutional: Positive for fatigue. Negative for fever, chills, appetite change and unexpected weight change.  Eyes: Negative for visual disturbance.  Respiratory: Negative for shortness of breath.   Cardiovascular: Negative for chest pain and leg swelling.  Gastrointestinal: Negative for abdominal pain, diarrhea and constipation.  Genitourinary: Negative for dysuria and frequency.  Musculoskeletal: Negative for myalgias and arthralgias.  Skin: Negative for color change and rash.  Neurological: Positive for tremors, syncope and light-headedness. Negative for weakness.  Hematological: Negative for adenopathy. Does not bruise/bleed easily.  Psychiatric/Behavioral: Negative for dysphoric mood. The patient is not nervous/anxious.        Objective:    BP 143/80 mmHg  Pulse 53  Temp(Src) 97.6 F (36.4 C) (Oral)  Ht 5' 0.5" (1.537 m)  Wt 174 lb 4 oz (79.039 kg)  BMI 33.46 kg/m2  SpO2 97% Physical Exam  Constitutional: She is oriented to person,  place, and time. She appears well-developed and well-nourished. No distress.  HENT:  Head: Normocephalic and atraumatic.  Right Ear: External ear normal.  Left Ear: External ear normal.  Nose: Nose normal.  Mouth/Throat: Oropharynx is clear and moist. No oropharyngeal exudate.  Eyes: Conjunctivae are normal. Pupils are equal, round, and reactive to light. Right eye exhibits no discharge. Left eye exhibits no discharge. No scleral icterus.  Neck: Normal range of motion. Neck supple. No tracheal deviation present. No thyromegaly present.  Cardiovascular: Normal rate, regular rhythm, normal heart sounds and intact distal pulses.  Exam reveals no gallop and no friction rub.   No murmur heard. Pulmonary/Chest: Effort normal and breath sounds normal. No accessory muscle usage. No tachypnea. No respiratory distress. She has no decreased breath sounds. She has no wheezes. She has no rhonchi. She has no rales. She exhibits no tenderness.  Musculoskeletal: Normal range of motion. She exhibits no edema or tenderness.  Lymphadenopathy:    She has no cervical adenopathy.  Neurological: She is alert and oriented to person, place, and time. She displays tremor (hands). No cranial nerve deficit or sensory deficit. She exhibits normal muscle tone. Coordination and gait normal.  Skin: Skin is warm and dry. No rash noted. She is not diaphoretic. No erythema. No pallor.  Psychiatric: She has a normal mood and affect. Her behavior is normal. Judgment and thought content normal.          Assessment & Plan:   Problem List Items Addressed This Visit      Unprioritized   Bradycardia    Bradycardia with use of propranolol. She is reluctant to stop this  medication as it has helped with her tremor. Will set up cardiology evaluation. Question if Holter might be helpful to assess bradycardia, or if reasonable to give trial off Propranolol and try alternative med for tremor.    Relevant Orders      Ambulatory  referral to Cardiology      CULTURE, URINE COMPREHENSIVE   Hyperlipidemia    Pt daughter would like her mother off statin medications. Given her age, we discussed pros and cons of statin use. Will stop the medication.    Relevant Medications      propranolol (INDERAL LA) 24 hr capsule   Other Relevant Orders      CULTURE, URINE COMPREHENSIVE   Hypertension    BP Readings from Last 3 Encounters:  09/19/14 143/80  09/02/14 134/72  07/22/14 154/80   BP well controlled for her age. Will continue current medications.    Relevant Medications      propranolol (INDERAL LA) 24 hr capsule   Syncope - Primary    Recent episode of syncope likely related to UTI and bradycardia. No recurrent symptoms since discharge, however continues to be bradycardic. Will set up cardiology evaluation (see note). Will repeat UA, CBC, CMP today. Encouraged her to wear an alert device at home.    Relevant Medications      propranolol (INDERAL LA) 24 hr capsule   Other Relevant Orders      POCT Urinalysis Dipstick (Completed)      Comprehensive metabolic panel      CBC w/Diff      CULTURE, URINE COMPREHENSIVE       Return in about 4 weeks (around 10/17/2014) for Recheck.

## 2014-09-19 NOTE — Assessment & Plan Note (Signed)
Recent episode of syncope likely related to UTI and bradycardia. No recurrent symptoms since discharge, however continues to be bradycardic. Will set up cardiology evaluation (see note). Will repeat UA, CBC, CMP today. Encouraged her to wear an alert device at home.

## 2014-09-19 NOTE — Assessment & Plan Note (Signed)
Bradycardia with use of propranolol. She is reluctant to stop this medication as it has helped with her tremor. Will set up cardiology evaluation. Question if Holter might be helpful to assess bradycardia, or if reasonable to give trial off Propranolol and try alternative med for tremor.

## 2014-09-19 NOTE — Assessment & Plan Note (Signed)
BP Readings from Last 3 Encounters:  09/19/14 143/80  09/02/14 134/72  07/22/14 154/80   BP well controlled for her age. Will continue current medications.

## 2014-09-21 LAB — COMPREHENSIVE METABOLIC PANEL
ALBUMIN: 3.8 g/dL (ref 3.5–5.2)
ALK PHOS: 57 U/L (ref 39–117)
ALT: 15 U/L (ref 0–35)
AST: 19 U/L (ref 0–37)
BILIRUBIN TOTAL: 0.8 mg/dL (ref 0.2–1.2)
BUN: 23 mg/dL (ref 6–23)
CO2: 30 mEq/L (ref 19–32)
Calcium: 9 mg/dL (ref 8.4–10.5)
Chloride: 103 mEq/L (ref 96–112)
Creatinine, Ser: 0.7 mg/dL (ref 0.4–1.2)
GFR: 90.31 mL/min (ref >60.00)
Glucose, Bld: 86 mg/dL (ref 70–99)
POTASSIUM: 3.7 meq/L (ref 3.5–5.1)
SODIUM: 140 meq/L (ref 135–145)
TOTAL PROTEIN: 6.7 g/dL (ref 6.0–8.3)

## 2014-09-23 ENCOUNTER — Encounter: Payer: Self-pay | Admitting: *Deleted

## 2014-09-23 LAB — CULTURE, URINE COMPREHENSIVE: Colony Count: 4000

## 2014-09-25 ENCOUNTER — Encounter: Payer: Self-pay | Admitting: *Deleted

## 2014-10-01 ENCOUNTER — Ambulatory Visit: Payer: Self-pay | Admitting: Internal Medicine

## 2014-10-03 ENCOUNTER — Institutional Professional Consult (permissible substitution): Payer: Medicare Other | Admitting: Cardiovascular Disease

## 2014-10-07 ENCOUNTER — Emergency Department: Payer: Self-pay | Admitting: Emergency Medicine

## 2014-10-07 ENCOUNTER — Telehealth: Payer: Self-pay | Admitting: Internal Medicine

## 2014-10-07 LAB — BASIC METABOLIC PANEL
ANION GAP: 4 — AB (ref 7–16)
BUN: 19 mg/dL — AB (ref 7–18)
Calcium, Total: 9 mg/dL (ref 8.5–10.1)
Chloride: 107 mmol/L (ref 98–107)
Co2: 32 mmol/L (ref 21–32)
Creatinine: 0.76 mg/dL (ref 0.60–1.30)
EGFR (African American): >60
EGFR (Non-African Amer.): >60
GLUCOSE: 88 mg/dL (ref 65–99)
Osmolality: 287 (ref 275–301)
Potassium: 3.8 mmol/L (ref 3.5–5.1)
SODIUM: 143 mmol/L (ref 136–145)

## 2014-10-07 LAB — CBC
HCT: 39.2 % (ref 35.0–47.0)
HGB: 13.1 g/dL (ref 12.0–16.0)
MCH: 30.7 pg (ref 26.0–34.0)
MCHC: 33.4 g/dL (ref 32.0–36.0)
MCV: 92 fL (ref 80–100)
Platelet: 179 10*3/uL (ref 150–440)
RBC: 4.27 10*6/uL (ref 3.80–5.20)
RDW: 13.8 % (ref 11.5–14.5)
WBC: 4.5 10*3/uL (ref 3.6–11.0)

## 2014-10-07 LAB — TROPONIN I
Troponin-I: <0.02 ng/mL
Troponin-I: <0.02 ng/mL

## 2014-10-07 LAB — PROTIME-INR
INR: 1
Prothrombin Time: 13 secs (ref 11.5–14.7)

## 2014-10-07 LAB — PRO B NATRIURETIC PEPTIDE: B-Type Natriuretic Peptide: 465 pg/mL — ABNORMAL HIGH (ref 0–450)

## 2014-10-07 NOTE — Telephone Encounter (Signed)
Agree. Needs to be evaluated in the ED for chest pain.

## 2014-10-07 NOTE — Telephone Encounter (Signed)
Spoke to patient, she is having some difficulty breathing, has had chest tightness a few times over the last 2 weeks. IT wakes her up at night. She denies CP currently, but states she feels uncomfortable, is having to take deep breaths. States she is feeling dizzy and weak. She is concerned her medications are causing this. BP was 143/74 HR 60. Advised pt to have someone drive her to ED for evaluation,  verbalized understanding. Recommended call back after ED to schedule follow up to discuss meds. Advised I would let Dr. Dan HumphreysWalker know.

## 2014-10-07 NOTE — Telephone Encounter (Signed)
Ms. Darrin Nipperass called saying she was in the hospital a few weeks ago and while in the hospital she changed her medication list and stopped taking certain things. She said recently she's felt a tightness in her chest that's not necessarily like a heart attack but she's wondering if it's a strong heartburn. She said twice within the past week she's been awakened in the middle of the night by pain and discomfort. She's wondering if it's from the medication change and is wondering if she can be seen. Please call the pt. Pt ph# [phone] Thank you.

## 2014-10-12 DIAGNOSIS — I10 Essential (primary) hypertension: Secondary | ICD-10-CM

## 2014-10-12 DIAGNOSIS — R001 Bradycardia, unspecified: Secondary | ICD-10-CM

## 2014-10-12 DIAGNOSIS — E785 Hyperlipidemia, unspecified: Secondary | ICD-10-CM

## 2014-10-16 ENCOUNTER — Encounter: Payer: Self-pay | Admitting: Cardiovascular Disease

## 2014-10-16 ENCOUNTER — Ambulatory Visit (INDEPENDENT_AMBULATORY_CARE_PROVIDER_SITE_OTHER): Payer: Medicare Other | Admitting: Cardiovascular Disease

## 2014-10-16 VITALS — BP 187/101 | HR 50 | Ht 60.0 in | Wt 177.0 lb

## 2014-10-16 DIAGNOSIS — R55 Syncope and collapse: Secondary | ICD-10-CM

## 2014-10-16 DIAGNOSIS — R001 Bradycardia, unspecified: Secondary | ICD-10-CM

## 2014-10-16 DIAGNOSIS — I10 Essential (primary) hypertension: Secondary | ICD-10-CM

## 2014-10-16 MED ORDER — PROPRANOLOL HCL 20 MG PO TABS: 20.0000 mg | ORAL_TABLET | Freq: Two times a day (BID) | ORAL | Status: DC

## 2014-10-16 NOTE — Progress Notes (Signed)
Primary care physician: Dr. Victorino DikeJennifer walker  HPI  This is a 80 year old female who is here today for a follow-up visit regarding presyncope. She has known history of hypertension, hyperlipidemia, GERD, arthritis and essential tremors. She was hospitalized in November at [facility] for syncope. She was in the kitchen trying to prepare a meal when she felt sick in her stomach followed by dizziness and a fall. She did not Reny out completely. Labs showed mildly elevated BUN she was also found to have urinary tract infection. She was noted to have sinus bradycardia with heart rate in the 50s. It was felt that her syncope was multifactorial due to mild volume depletion, UTI and a vasovagal component. Background sinus bradycardia in the setting of treatment with propranolol for tremors probably contributed. The dose of propranolol was decreased to 60 mg. Carotid Doppler showed no evidence of obstructive disease. Echocardiogram showed an ejection fraction of 50-55% with mild to moderate mitral regurgitation, mild tricuspid regurgitation and no significant aortic stenosis.   she has been doing reasonably well but continues to have episodes of dizziness without syncope. No chest pain or shortness of breath.    Allergies  Allergen Reactions  . Amlodipine     malaise  . Amoxicillin-Pot Clavulanate     Other reaction(s): Other (See Comments) Cannot remember.  . Penicillins   . Codeine Rash     Current Outpatient Prescriptions on File Prior to Visit  Medication Sig Dispense Refill  . cholecalciferol (VITAMIN D) 1000 UNITS tablet Take 1,000 Units by mouth daily.    . hydrochlorothiazide (HYDRODIURIL) 25 MG tablet Take 1 tablet (25 mg total) by mouth daily. 90 tablet 3  . lisinopril (PRINIVIL,ZESTRIL) 40 MG tablet Take 1 tablet (40 mg total) by mouth daily. 90 tablet 3  . Multiple Vitamins-Minerals (MULTIVITAMIN WITH MINERALS) tablet Take 1 tablet by mouth daily.    . pravastatin (PRAVACHOL) 40 MG tablet  Take 20 mg by mouth. Takes 1/2 tablet by mouth Mon., Wed. And Friday.     No current facility-administered medications on file prior to visit.     Past Medical History  Diagnosis Date  . Hyperlipidemia   . Ulnar nerve lesion   . Vitamin D deficiency   . HTN (hypertension)   . Carpal tunnel syndrome   . GERD (gastroesophageal reflux disease)   . Benign essential tremor   . DJD (degenerative joint disease) of knee   . Abnormal stress test     w/fixed defect  . Abdominal pain, right upper quadrant   . UTI (urinary tract infection)   . Syncope and collapse      Past Surgical History  Procedure Laterality Date  . Vaginal delivery      1 set Twins/2 single births  . Breast cyst excision      x 2 - nml path per pt  . Umbilical hernia repair    . [place]      [place]  . Total shoulder replacement  08/2010    left  . Carpal tunnel release  2004  . Breast biopsy  1957 and 1962    x 2, Nml per pt  . Tubal ligation    . Cataract extraction    . Bilateral bletharoplastices       Family History  Problem Relation Age of Onset  . Hypertension Mother   . Arthritis Mother   . Stroke Mother   . Dementia Mother   . Early death Father [phone]  from brain cancer  . Brain cancer Father [id number]  . Breast cancer Maternal Aunt   . Alcohol abuse Other      History   Social History  . Marital Status: Widowed    Spouse Name: N/A    Number of Children: 4  . Years of Education: N/A   Occupational History  . 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 Smoking status: Never Smoker   . Smokeless tobacco: Never Used  . Alcohol Use: No  . Drug Use: No  . Sexual Activity: Not on file   Other Topics Concern  . Not on file   Social History Narrative   [place]. Widow. Has children who live nearby.     PHYSICAL EXAM   BP 187/101 mmHg  Pulse 50  Ht 5' (1.524 m)  Wt 177 lb (80.287 kg)  BMI 34.57  kg/m2  Constitutional: She is oriented to person, place, and time. She appears well-developed and well-nourished. No distress.  HENT: No nasal discharge.  Head: Normocephalic and atraumatic.  Eyes: Pupils are equal and round. No discharge.  Neck: Normal range of motion. Neck supple. No JVD present. No thyromegaly present.  Cardiovascular: Normal rate, regular rhythm, normal heart sounds. Exam reveals no gallop and no friction rub. No murmur heard.  Pulmonary/Chest: Effort normal and breath sounds normal. No stridor. No respiratory distress. She has no wheezes. She has no rales. She exhibits no tenderness.  Abdominal: Soft. Bowel sounds are normal. She exhibits no distension. There is no tenderness. There is no rebound and no guarding.  Musculoskeletal: Normal range of motion. She exhibits no edema and no tenderness.  Neurological: She is alert and oriented to person, place, and time. Coordination normal.  Skin: Skin is warm and dry. No rash noted. She is not diaphoretic. No erythema. No pallor.  Psychiatric: She has a normal mood and affect. Her behavior is normal. Judgment and thought content normal.    ZOX:WRUEAEKG:Sinus Bradycardia  P:QRS - 1:1, Superior P axis, Short PRi, H Rate 50 Low voltage in precordial leads.   ABNORMAL    ASSESSMENT AND PLAN

## 2014-10-16 NOTE — Patient Instructions (Signed)
Your physician has recommended you make the following change in your medication:  Decrease Propranolol to 20 mg twice daily   Your physician has recommended that you wear a holter monitor. Holter monitors are medical devices that record the heart's electrical activity. Doctors most often use these monitors to diagnose arrhythmias. Arrhythmias are problems with the speed or rhythm of the heartbeat. The monitor is a small, portable device. You can wear one while you do your normal daily activities. This is usually used to diagnose what is causing palpitations/syncope (passing out).  Your physician recommends that you schedule a follow-up appointment in:  1 month

## 2014-10-18 ENCOUNTER — Encounter: Payer: Self-pay | Admitting: Cardiovascular Disease

## 2014-10-18 NOTE — Assessment & Plan Note (Signed)
Blood pressure is elevated today. Continue to monitor. She used to be on hydralazine.

## 2014-10-18 NOTE — Assessment & Plan Note (Signed)
The patient continues to have symptoms of dizziness. Sinus bradycardia might be still contributing. I decreased the dose of propranolol 20 mg twice daily. We might ultimately need to discontinue this medication. I requested a 48-hour Holter monitor for evaluation.

## 2014-10-22 ENCOUNTER — Other Ambulatory Visit: Payer: Self-pay | Admitting: *Deleted

## 2014-10-22 DIAGNOSIS — R001 Bradycardia, unspecified: Secondary | ICD-10-CM

## 2014-10-22 MED ORDER — PROPRANOLOL HCL 20 MG PO TABS: 20.0000 mg | ORAL_TABLET | Freq: Two times a day (BID) | ORAL | Status: DC

## 2014-10-30 ENCOUNTER — Ambulatory Visit (INDEPENDENT_AMBULATORY_CARE_PROVIDER_SITE_OTHER): Payer: Medicare Other | Admitting: Internal Medicine

## 2014-10-30 ENCOUNTER — Encounter: Payer: Self-pay | Admitting: Internal Medicine

## 2014-10-30 VITALS — BP 129/81 | HR 57 | Temp 98.0°F | Ht 60.0 in | Wt 171.5 lb

## 2014-10-30 DIAGNOSIS — R001 Bradycardia, unspecified: Secondary | ICD-10-CM

## 2014-10-30 DIAGNOSIS — I10 Essential (primary) hypertension: Secondary | ICD-10-CM

## 2014-10-30 DIAGNOSIS — R55 Syncope and collapse: Secondary | ICD-10-CM

## 2014-10-30 DIAGNOSIS — E785 Hyperlipidemia, unspecified: Secondary | ICD-10-CM

## 2014-10-30 LAB — COMPREHENSIVE METABOLIC PANEL
ALBUMIN: 3.9 g/dL (ref 3.5–5.2)
ALT: 14 U/L (ref 0–35)
AST: 21 U/L (ref 0–37)
Alkaline Phosphatase: 66 U/L (ref 39–117)
BUN: 16 mg/dL (ref 6–23)
CHLORIDE: 101 meq/L (ref 96–112)
CO2: 34 mEq/L — ABNORMAL HIGH (ref 19–32)
Calcium: 9.9 mg/dL (ref 8.4–10.5)
Creatinine, Ser: 0.65 mg/dL (ref 0.40–1.20)
GFR: 93.5 mL/min (ref >60.00)
GLUCOSE: 88 mg/dL (ref 70–99)
POTASSIUM: 4.2 meq/L (ref 3.5–5.1)
Sodium: 139 mEq/L (ref 135–145)
TOTAL PROTEIN: 7.2 g/dL (ref 6.0–8.3)
Total Bilirubin: 0.7 mg/dL (ref 0.2–1.2)

## 2014-10-30 LAB — LIPID PANEL
CHOLESTEROL: 192 mg/dL (ref 0–200)
HDL: 50.5 mg/dL (ref >39.00)
LDL Cholesterol: 129 mg/dL — ABNORMAL HIGH (ref 0–99)
NonHDL: 141.5
TRIGLYCERIDES: 63 mg/dL (ref 0.0–149.0)
Total CHOL/HDL Ratio: 4
VLDL: 12.6 mg/dL (ref 0.0–40.0)

## 2014-10-30 NOTE — Assessment & Plan Note (Signed)
Pt stopped Pravastatin. Will recheck lipids and LFTs with labs today.

## 2014-10-30 NOTE — Assessment & Plan Note (Signed)
BP Readings from Last 3 Encounters:  10/30/14 129/81  10/16/14 187/101  09/19/14 143/80   BP well controlled on lisinopril, HCTZ and propranolol. Will continue.

## 2014-10-30 NOTE — Assessment & Plan Note (Signed)
No recurrent episodes. Bradycardia improved with lower dose of Propranolol. Holter monitor results pending.

## 2014-10-30 NOTE — Patient Instructions (Signed)
Labs today.  Follow up in 3 months and sooner as needed. 

## 2014-10-30 NOTE — Progress Notes (Signed)
Pre visit review using our clinic review tool, if applicable. No additional management support is needed unless otherwise documented below in the visit note. 

## 2014-10-30 NOTE — Progress Notes (Signed)
Subjective:    Patient ID: Cheryl LoganRita Steele Winters, female    DOB: 1934-03-21, 81 y.o.   MRN: [id number]  HPI  81YO female presents for follow up.  Syncope - Seen by cardiology, Dr. Kirke CorinArida. Had Holter monitor, but has not had any follow up yet on this. Appt scheduled for Feb. No further syncope or lightheadedness. Generally feeling well. Stopped Pravastatin because she felt generally poorly on this medication. Some improvement noted. Trying to follow healthy diet and lose weight.   Wt Readings from Last 3 Encounters:  10/30/14 171 lb 8 oz (77.792 kg)  10/16/14 177 lb (80.287 kg)  09/19/14 174 lb 4 oz (79.039 kg)     Past medical, surgical, family and social history per today's encounter.  Review of Systems  Constitutional: Positive for fatigue. Negative for fever, chills, appetite change and unexpected weight change.  Eyes: Negative for visual disturbance.  Respiratory: Negative for shortness of breath.   Cardiovascular: Negative for chest pain, palpitations and leg swelling.  Gastrointestinal: Negative for abdominal pain, diarrhea and constipation.  Musculoskeletal: Negative for myalgias and arthralgias.  Skin: Negative for color change and rash.  Hematological: Negative for adenopathy. Does not bruise/bleed easily.  Psychiatric/Behavioral: Negative for dysphoric mood. The patient is not nervous/anxious.        Objective:    BP 129/81 mmHg  Pulse 57  Temp(Src) 98 F (36.7 C) (Oral)  Ht 5' (1.524 m)  Wt 171 lb 8 oz (77.792 kg)  BMI 33.49 kg/m2  SpO2 98% Physical Exam  Constitutional: She is oriented to person, place, and time. She appears well-developed and well-nourished. No distress.  HENT:  Head: Normocephalic and atraumatic.  Right Ear: External ear normal.  Left Ear: External ear normal.  Nose: Nose normal.  Mouth/Throat: Oropharynx is clear and moist. No oropharyngeal exudate.  Eyes: Conjunctivae are normal. Pupils are equal, round, and reactive to light. Right  eye exhibits no discharge. Left eye exhibits no discharge. No scleral icterus.  Neck: Normal range of motion. Neck supple. No tracheal deviation present. No thyromegaly present.  Cardiovascular: Normal rate, regular rhythm, normal heart sounds and intact distal pulses.  Exam reveals no gallop and no friction rub.   No murmur heard. Pulmonary/Chest: Effort normal and breath sounds normal. No accessory muscle usage. No tachypnea. No respiratory distress. She has no decreased breath sounds. She has no wheezes. She has no rhonchi. She has no rales. She exhibits no tenderness.  Musculoskeletal: Normal range of motion. She exhibits no edema or tenderness.  Lymphadenopathy:    She has no cervical adenopathy.  Neurological: She is alert and oriented to person, place, and time. No cranial nerve deficit. She exhibits normal muscle tone. Coordination normal.  Skin: Skin is warm and dry. No rash noted. She is not diaphoretic. No erythema. No pallor.  Psychiatric: She has a normal mood and affect. Her behavior is normal. Judgment and thought content normal.          Assessment & Plan:   Problem List Items Addressed This Visit      Unprioritized   Bradycardia    Improved with lower dose of Propranolol. Follow up with Dr. Kirke CorinArida pending to go over Holter results.      Hyperlipidemia    Pt stopped Pravastatin. Will recheck lipids and LFTs with labs today.      Relevant Orders   Comprehensive metabolic panel   Lipid panel   Hypertension    BP Readings from Last 3 Encounters:  10/30/14 129/81  10/16/14 187/101  09/19/14 143/80   BP well controlled on lisinopril, HCTZ and propranolol. Will continue.      Syncope - Primary    No recurrent episodes. Bradycardia improved with lower dose of Propranolol. Holter monitor results pending.          Return in about 3 months (around 01/29/2015) for Recheck.

## 2014-10-30 NOTE — Assessment & Plan Note (Signed)
Improved with lower dose of Propranolol. Follow up with Dr. Kirke CorinArida pending to go over Holter results.

## 2014-10-31 ENCOUNTER — Encounter: Payer: Self-pay | Admitting: *Deleted

## 2014-11-05 ENCOUNTER — Encounter: Payer: Self-pay | Admitting: Internal Medicine

## 2014-11-06 ENCOUNTER — Ambulatory Visit (INDEPENDENT_AMBULATORY_CARE_PROVIDER_SITE_OTHER): Payer: Medicare Other

## 2014-11-06 ENCOUNTER — Other Ambulatory Visit: Payer: Self-pay

## 2014-11-06 ENCOUNTER — Telehealth: Payer: Self-pay | Admitting: *Deleted

## 2014-11-06 DIAGNOSIS — R55 Syncope and collapse: Secondary | ICD-10-CM

## 2014-11-06 DIAGNOSIS — R001 Bradycardia, unspecified: Secondary | ICD-10-CM

## 2014-11-06 NOTE — Telephone Encounter (Signed)
Informed patient per Dr. Kirke CorinArida event monitor showed NSR with short runs of SVT. Sinus bradycardia that was not significant.  Continue same meds Patient verbalized understanding

## 2014-11-18 ENCOUNTER — Encounter: Payer: Self-pay | Admitting: Cardiovascular Disease

## 2014-11-18 ENCOUNTER — Ambulatory Visit (INDEPENDENT_AMBULATORY_CARE_PROVIDER_SITE_OTHER): Payer: Medicare Other | Admitting: Cardiovascular Disease

## 2014-11-18 VITALS — BP 124/80 | HR 64 | Ht 60.0 in | Wt 174.8 lb

## 2014-11-18 DIAGNOSIS — R001 Bradycardia, unspecified: Secondary | ICD-10-CM

## 2014-11-18 DIAGNOSIS — I10 Essential (primary) hypertension: Secondary | ICD-10-CM

## 2014-11-18 NOTE — Progress Notes (Signed)
Primary care physician: Dr. Victorino DikeJennifer Winters  HPI  This is a 81 year old female who is here today for a follow-up visit regarding presyncope. She has known history of hypertension, hyperlipidemia, GERD, arthritis and essential tremors. She was hospitalized in November at [facility] for syncope. She was in the kitchen trying to prepare a meal when she felt sick in her stomach followed by dizziness and a fall. She did not [place] out completely. Labs showed mildly elevated BUN she was also found to have urinary tract infection. She was noted to have sinus bradycardia with heart rate in the 50s. It was felt that her syncope was multifactorial due to mild volume depletion, UTI and a vasovagal component. Background sinus bradycardia in the setting of treatment with propranolol for tremors probably contributed. The dose of propranolol was decreased to 60 mg. Carotid Doppler showed no evidence of obstructive disease. Echocardiogram showed an ejection fraction of 50-55% with mild to moderate mitral regurgitation, mild tricuspid regurgitation and no significant aortic stenosis.  I decreased the dose of propranolol 20 mg twice daily. Holter monitor showed short runs of SVT with occasional sinus bradycardia. However, mean heart rate was in the 60s. Lowest heart rate was 47 at 3 AM. She feels back to her baseline with no recurrent episodes of dizziness. She still has very brief episodes of palpitations with some shortness of breath but not frequent.   Allergies  Allergen Reactions  . Amlodipine     malaise  . Amoxicillin-Pot Clavulanate     Other reaction(s): Other (See Comments) Cannot remember.  . Penicillins   . Codeine Rash     Current Outpatient Prescriptions on File Prior to Visit  Medication Sig Dispense Refill  . Biotin 5000 MCG TABS Take by mouth daily.    . hydrochlorothiazide (HYDRODIURIL) 25 MG tablet Take 1 tablet (25 mg total) by mouth daily. 90 tablet 3  . lisinopril (PRINIVIL,ZESTRIL) 40 MG  tablet Take 1 tablet (40 mg total) by mouth daily. 90 tablet 3  . Multiple Vitamins-Minerals (MULTIVITAMIN WITH MINERALS) tablet Take 1 tablet by mouth daily.    . propranolol (INDERAL) 20 MG tablet Take 1 tablet (20 mg total) by mouth 2 (two) times daily. 180 tablet 3   No current facility-administered medications on file prior to visit.     Past Medical History  Diagnosis Date  . Hyperlipidemia   . Ulnar nerve lesion   . Vitamin D deficiency   . HTN (hypertension)   . Carpal tunnel syndrome   . GERD (gastroesophageal reflux disease)   . Benign essential tremor   . DJD (degenerative joint disease) of knee   . Abnormal stress test     w/fixed defect  . Abdominal pain, right upper quadrant   . UTI (urinary tract infection)   . Syncope and collapse      Past Surgical History  Procedure Laterality Date  . Vaginal delivery      1 set Twins/2 single births  . Breast cyst excision      x 2 - nml path per pt  . Umbilical hernia repair    . [place]      [place]  . Total shoulder replacement  08/2010    left  . Carpal tunnel release  2004  . Breast biopsy  1957 and 1962    x 2, Nml per pt  . Tubal ligation    . Cataract extraction    . Bilateral bletharoplastices  Family History  Problem Relation Age of Onset  . Hypertension Mother   . Arthritis Mother   . Stroke Mother   . Dementia Mother   . Early death Father 65    from brain cancer  . Brain cancer Father 49  . Breast cancer Maternal Aunt   . Alcohol abuse Other      History   Social History  . Marital Status: Widowed    Spouse Name: N/A    Number of Children: 4  . Years of Education: N/A   Occupational History  . Retired - Biomedical scientist    Social History Main Topics  . Smoking status: Never Smoker   . Smokeless tobacco: Never Used  . Alcohol Use: No  . Drug Use: No  . Sexual Activity: Not on file   Other Topics Concern  . Not on file    Social History Narrative   Lives alone in [place]. Widow. Has children who live nearby.     PHYSICAL EXAM   BP 124/80 mmHg  Pulse 64  Ht 5' (1.524 m)  Wt 174 lb 12 oz (79.266 kg)  BMI 34.13 kg/m2  Constitutional: She is oriented to person, place, and time. She appears well-developed and well-nourished. No distress.  HENT: No nasal discharge.  Head: Normocephalic and atraumatic.  Eyes: Pupils are equal and round. No discharge.  Neck: Normal range of motion. Neck supple. No JVD present. No thyromegaly present.  Cardiovascular: Normal rate, regular rhythm, normal heart sounds. Exam reveals no gallop and no friction rub. No murmur heard.  Pulmonary/Chest: Effort normal and breath sounds normal. No stridor. No respiratory distress. She has no wheezes. She has no rales. She exhibits no tenderness.  Abdominal: Soft. Bowel sounds are normal. She exhibits no distension. There is no tenderness. There is no rebound and no guarding.  Musculoskeletal: Normal range of motion. She exhibits no edema and no tenderness.  Neurological: She is alert and oriented to person, place, and time. Coordination normal.  Skin: Skin is warm and dry. No rash noted. She is not diaphoretic. No erythema. No pallor.  Psychiatric: She has a normal mood and affect. Her behavior is normal. Judgment and thought content normal.       ASSESSMENT AND PLAN

## 2014-11-18 NOTE — Patient Instructions (Signed)
Continue same medications.   Your physician wants you to follow-up in: 6 months.  You will receive a reminder letter in the mail two months in advance. If you don't receive a letter, please call our office to schedule the follow-up appointment.  

## 2014-11-24 NOTE — Assessment & Plan Note (Signed)
Blood pressure is well controlled on current medications. 

## 2014-11-24 NOTE — Assessment & Plan Note (Signed)
This improved significantly after decreasing the dose of propranolol. She does have episodes of palpitations but not frequent. There was short runs of SVT. If these episodes become more frequent, we might need to consider a pacemaker placement in order to treat her tachyarrhythmia without causing significant bradycardia.

## 2015-01-30 ENCOUNTER — Encounter: Payer: Self-pay | Admitting: Internal Medicine

## 2015-01-30 ENCOUNTER — Ambulatory Visit (INDEPENDENT_AMBULATORY_CARE_PROVIDER_SITE_OTHER): Payer: Medicare Other | Admitting: Internal Medicine

## 2015-01-30 VITALS — BP 127/79 | HR 60 | Temp 98.1°F | Ht 60.0 in | Wt 178.1 lb

## 2015-01-30 DIAGNOSIS — R001 Bradycardia, unspecified: Secondary | ICD-10-CM

## 2015-01-30 DIAGNOSIS — R110 Nausea: Secondary | ICD-10-CM | POA: Insufficient documentation

## 2015-01-30 DIAGNOSIS — I10 Essential (primary) hypertension: Secondary | ICD-10-CM

## 2015-01-30 NOTE — Progress Notes (Signed)
Pre visit review using our clinic review tool, if applicable. No additional management support is needed unless otherwise documented below in the visit note. 

## 2015-01-30 NOTE — Patient Instructions (Addendum)
Try using Zantac or Prilosec if you have symptoms of indigestion.  Follow up in 3 months.

## 2015-01-30 NOTE — Assessment & Plan Note (Signed)
Symptoms improved with lower dose of Propranolol. Will continue to monitor.

## 2015-01-30 NOTE — Assessment & Plan Note (Signed)
Recent mild nausea and epigastric "uneasiness" mostly at night. Symptoms are most consistent with GERD. Discussed adding Zantac or Prilosec to help with symptoms. If symptoms persistent, discussed referral to GI for upper endoscopy.

## 2015-01-30 NOTE — Progress Notes (Signed)
Subjective:    Patient ID: Cheryl Winters, female    DOB: 01/27/1934, 81 y.o.   MRN: [id number]  HPI  81YO female presents for follow up.  Last seen 10/20/2014.   Feeling well except for occasional episodes of mild nausea. No vomiting. No urinary symptoms. Described as rare. Not taking anything for this except occasional Tums. Not associated with any certain foods.  No recent palpitations, chest pain, dyspnea. Compliant with medications.  Past medical, surgical, family and social history per today's encounter.  Review of Systems  Constitutional: Negative for fever, chills, appetite change, fatigue and unexpected weight change.  Eyes: Negative for visual disturbance.  Respiratory: Negative for shortness of breath.   Cardiovascular: Negative for chest pain, palpitations and leg swelling.  Gastrointestinal: Positive for nausea. Negative for vomiting, abdominal pain, diarrhea, constipation and blood in stool.  Genitourinary: Negative for dysuria, urgency and frequency.  Skin: Negative for color change and rash.  Hematological: Negative for adenopathy. Does not bruise/bleed easily.  Psychiatric/Behavioral: Negative for sleep disturbance and dysphoric mood. The patient is not nervous/anxious.        Objective:    BP 127/79 mmHg  Pulse 60  Temp(Src) 98.1 F (36.7 C) (Oral)  Ht 5' (1.524 m)  Wt 178 lb 2 oz (80.797 kg)  BMI 34.79 kg/m2  SpO2 95% Physical Exam  Constitutional: She is oriented to person, place, and time. She appears well-developed and well-nourished. No distress.  HENT:  Head: Normocephalic and atraumatic.  Right Ear: External ear normal.  Left Ear: External ear normal.  Nose: Nose normal.  Mouth/Throat: Oropharynx is clear and moist. No oropharyngeal exudate.  Eyes: Conjunctivae and EOM are normal. Pupils are equal, round, and reactive to light. Right eye exhibits no discharge. Left eye exhibits no discharge. No scleral icterus.  Neck: Normal range of motion.  Neck supple. No tracheal deviation present. No thyromegaly present.  Cardiovascular: Normal rate, regular rhythm, normal heart sounds and intact distal pulses.  Exam reveals no gallop and no friction rub.   No murmur heard. Pulmonary/Chest: Effort normal and breath sounds normal. No respiratory distress. She has no wheezes. She has no rales. She exhibits no tenderness.  Abdominal: Soft. Bowel sounds are normal. She exhibits no distension and no mass. There is no tenderness. There is no rebound and no guarding.  Musculoskeletal: Normal range of motion. She exhibits no edema or tenderness.  Lymphadenopathy:    She has no cervical adenopathy.  Neurological: She is alert and oriented to person, place, and time. No cranial nerve deficit. She exhibits normal muscle tone. Coordination normal.  Skin: Skin is warm and dry. No rash noted. She is not diaphoretic. No erythema. No pallor.  Psychiatric: She has a normal mood and affect. Her behavior is normal. Judgment and thought content normal.          Assessment & Plan:   Problem List Items Addressed This Visit      Unprioritized   Bradycardia    Symptoms improved with lower dose of Propranolol. Will continue to monitor.      Hypertension    BP Readings from Last 3 Encounters:  01/30/15 127/79  11/18/14 124/80  10/30/14 129/81   BP well controlled on current medications. Renal function with labs at next visit.      Nausea without vomiting - Primary    Recent mild nausea and epigastric "uneasiness" mostly at night. Symptoms are most consistent with GERD. Discussed adding Zantac or Prilosec to help with symptoms. If  symptoms persistent, discussed referral to GI for upper endoscopy.          Return in about 3 months (around 05/01/2015) for Recheck.

## 2015-01-30 NOTE — Assessment & Plan Note (Signed)
BP Readings from Last 3 Encounters:  01/30/15 127/79  11/18/14 124/80  10/30/14 129/81   BP well controlled on current medications. Renal function with labs at next visit.

## 2015-02-08 NOTE — H&P (Signed)
PATIENT NAME:  Cheryl Winters, Cheryl Winters MR#:  [id number] DATE OF BIRTH:  November 24, 1933  DATE OF ADMISSION:  09/12/2014  REFERRING PHYSICIAN:  Bobetta LimeEryka A. Inocencio HomesGayle, MD  PRIMARY CARE PHYSICIAN:  Ginette PitmanJennifer A. Dan HumphreysWalker, MD   ADMITTING PHYSICIAN:  Crissie FiguresEdavally N. Theora Vankirk, MD   CHIEF COMPLAINT:  Dizziness with near syncope.    HISTORY OF PRESENT ILLNESS:  This is a 81 year old Caucasian female with a past medical history significant for hypertension, hyperlipidemia, gastroesophageal reflux disease, and history of degenerative joint disease, who presents to the Emergency Room with the complaints of feeling of dizziness with about to fall, which started just around dinnertime. The patient states that she was doing well in her usual state of health until about dinnertime. She started feeling dizziness and some wooziness and slowly slid onto the floor on her knees but did not fall. She denies any loss of consciousness. No associated chest pain or shortness of breath. She did feel some nausea and epigastric distress; otherwise, no palpitations. No focal weakness or numbness. No history of any chest pain or shortness of breath. No recent fever, cough, nausea, vomiting, diarrhea, abdominal pain, or urinary tract infection symptoms. The patient was evaluated by the ED physician, and her presenting vital signs were a temperature of 98 degrees Fahrenheit, pulse rate 61 per minute, respirations 20, blood pressure 118/49, and oxygen saturation of 100% on room air. The patient also had further workup including a CT scan, which is negative for any acute changes and some chronic microvascular changes. Labs were essentially normal except for urine showed some WBCs with trace bacteria and LE positive. In view of her symptoms and borderline low blood pressure, the hospitalist service was consulted for further evaluation and management. The patient states that she is feeling slightly better. Denies any dizziness at this time. No loss of consciousness. No focal  weakness or numbness. No chest pain. No palpitations.   PAST MEDICAL HISTORY: 1.  Hypertension.  2.  Hyperlipidemia.  3.  Gastroesophageal reflux disease.  4.  Degenerative joint disease status post bilateral knee replacement and left shoulder replacement.   PAST SURGICAL HISTORY:  1.  Status post umbilical hernia repair.  2.  Status post bilateral knee replacements.  3.  Status post left shoulder replacement.  4.  Status post bilateral carpal tunnel syndrome release surgery.   ALLERGIES:  PENICILLIN, AUGMENTIN, OXYCODONE, CODEINE.   HOME MEDICATIONS: 1.  Fish oil capsules 1000 mg 1 capsule orally once a day.  2.  Hydralazine 25 mg tablet 1 tablet 2 times a day. 3.  Hydrochlorothiazide 25 mg tablet 1 tablet orally once a day.  4.  Lisinopril 40 mg 1 tablet orally once a day.  5.  Os-Cal 500 mg with 200 international units of vitamin D 1 tablet once a day.  6.  Pravastatin 40 mg tablet 1 tablet orally once at bedtime.  7.  Propranolol extended-release capsule 120 mg capsule 1 tablet orally once a day.  8.  Ultra [employer] Gold multivitamin once a day.  9.  Ultram 50 mg tablet 1 to 2 tablets orally every 6 hours as needed.   SOCIAL HISTORY:  She is widowed and lives by herself. No history of any alcohol, smoking, or drug usage. She is able to take care of herself.   FAMILY HISTORY:  Nonsignificant.   REVIEW OF SYSTEMS: CONSTITUTIONAL:  Negative for fever, fatigue, generalized weakness, or recent weight gain or weight loss.  EYES:  Negative for blurred vision or double vision. No  pain. No redness. No inflammation.  EARS, NOSE, AND THROAT:  Negative for tinnitus, ear pain, or hearing loss. No epistaxis. No discharge. No difficulty swallowing.  RESPIRATORY:  Negative for cough, wheezing, hemoptysis, dyspnea, or painful respirations.  CARDIOVASCULAR:  Negative for chest pain, orthopnea, dyspnea on exertion, or palpitations. She did have near syncope, as mentioned in the history of present  illness, but did not lose consciousness.  GASTROINTESTINAL:  She did have some nausea with mild epigastric distress with near syncopal episode, but denies any vomiting. No diarrhea. No abdominal pain. No hematemesis. No melena. No blood in the stools. History of chronic gastroesophageal reflux disease, stable on p.r.n. medications.  GENITOURINARY:  Negative for dysuria, hematuria, frequency, urgency, or incontinence.  ENDOCRINE:  Negative for polyuria or nocturia. No thyroid problems. No heat or cold intolerance.  HEMATOLOGIC AND LYMPHATIC:  Negative for anemia, easy bruising or bleeding, or swollen glands.  INTEGUMENTARY:  Negative for acne or skin rash.  MUSCULOSKELETAL:  She does have chronic DJD and is status post bilateral knee replacement and left shoulder replacement. She takes Ultram as needed for pain. She does have swelling of the left lower extremity, which is chronic and likely secondary due to her chronic DJD.  NEUROLOGICAL:  Negative for focal weakness or numbness. No history of CVA, TIA, or seizure disorder.  PSYCHIATRIC:  Negative for anxiety, insomnia, or depression.   PHYSICAL EXAMINATION: VITAL SIGNS:  Temperature 98 degrees Fahrenheit, pulse rate 61 per minute and regular, respirations 20 per minute, blood pressure 118/49, pulse oximetry 100% on room air.  GENERAL:  Well-developed, well-nourished, alert, in no acute distress, pleasant and cooperative. Denies any complaints at this time.   HEAD:  Atraumatic, normocephalic.  EYES:  Pupils are equal and reactive to light and accommodation. No conjunctival pallor. No scleral icterus. Extraocular movements are intact.  NOSE:  No nasal lesions. No drainage.  EARS:  No drainage. No external lesions.  MOUTH:  No oral lesions. No exudates.  NECK:  Supple. No JVD. No thyromegaly. No carotid bruits. Range of motion of the neck is within normal limits.  RESPIRATORY:  Good respiratory effort. Not using accessory muscles of respiration.  Clear to auscultation bilaterally. No rales or rhonchi.  CARDIOVASCULAR:  S1 and S2, regular. Bradycardia is present. No murmurs appreciated. No clicks. No gallops. Chronic left lower extremity edema of 1+. Peripheral pulses are equal at carotid, femoral, and pedal pulses.  GASTROINTESTINAL:  Abdomen is soft and nontender. No hepatosplenomegaly. Bowel sounds positive and equal in all 4 quadrants. No guarding. No rigidity. No tenderness.  GENITOURINARY:  Deferred.  MUSCULOSKELETAL:  Gait is not tested. Range of motion of bilateral knees and left shoulder is slightly reduced because of chronic degenerative joint disease, otherwise negative examination.  SKIN:  Inspection within normal limits.  LYMPHATIC:  No cervical lymphadenopathy.  VASCULAR:  Good dorsalis pedis and posterior tibial pulses.  NEUROLOGICAL:  Alert, awake, and oriented x 3. Cranial nerves II through XII are grossly intact. Deep tendon reflexes are 2+ bilateral and symmetrical. Motor strength is 5/5 in both upper and lower extremities.  PSYCHIATRIC:  Judgment and insight are adequate. Alert and oriented x 3. Memory and mood are within normal limits.   LABORATORY DATA:  Serum glucose is 119, BUN 24, creatinine 0.94, serum sodium 141, potassium 3.5, chloride 105, bicarbonate 30, serum calcium 9.2, total protein 6.9, serum albumin 3.6, total bilirubin 0.7, alkaline phosphatase 64, AST 28, and ALT is 20. Troponin is less than 0.02. CBC with  WBC of 6.9, hemoglobin 12.6, hematocrit 37.5, platelet count 189, 000, and MCV of 92. Urinalysis:  Clear, blood negative, nitrite negative, LE 2+, WBC 22 per high-power field, bacteria trace.   IMAGING STUDIES:  CT of the head, noncontrast study impression:  No acute intracranial abnormalities. Chronic microvascular disease and brain atrophy.   Chest x-ray impression:  Cardiomegaly. No active disease.   Ultrasound Doppler of left lower extremity:  Negative for deep vein thrombosis.   EKG:  Sinus  bradycardia with ventricular rate of 58 beats per minute and Q waves in leads III and aVF.    ASSESSMENT AND PLAN:  This is a 81 year old Caucasian female with a past medical history significant for hypertension, hyperlipidemia, gastroesophageal reflux disease, and chronic degenerative joint disease, who presents with complaints of near syncope with dizziness.   1.  Near syncope with dizziness. No history of loss of consciousness. No fall. Likely cause of dizziness is not known at this time. EKG:  Sinus bradycardia possibly secondary due to her medication beta blocker, possibly vasovagal component is there, possibly dehydration, and possibly secondary due to sepsis and urinary tract infection. Plan:  Admit to telemetry, cycle cardiac enzymes, telemetry monitoring. We will hold off on propanol in view of bradycardia at this time. Order echocardiogram. Cardiology consultation. Carotid Dopplers to rule out any carotid artery disease. Further workup accordingly.  2.  Urinary tract infection. Plan:  Urine cultures. Start Levaquin 500 mg IV daily. Follow up cultures.  3.  History of hypertension. Blood pressure is low normal at this time. We will hold off on propranolol in view of low heart rate, and also, we will hold off on hydralazine in view of borderline low blood pressure. Continue  lisinopril 40 mg p.o. daily and follow blood pressure monitoring.  4.  History of gastroesophageal reflux disease, stable symptoms. Continue proton pump inhibitor Protonix 40 mg once a day.  5.  Hyperlipidemia, on pravastatin. Continue same.  6.  History of chronic degenerative joint disease status post bilateral knee replacement and left shoulder replacement. Stable on p.r.n. Ultram. Continue same.    CODE STATUS:  Full code.   TIME SPENT:  55 minutes.     ____________________________ Crissie Figures, MD enr:nb D: 09/12/2014 00:08:40 ET T: 09/12/2014 01:16:36 ET JOB#: [id number]  cc: Crissie Figures, MD,  <Dictator> Ginette Pitman. Dan Humphreys, MD  Crissie Figures MD ELECTRONICALLY SIGNED 09/13/2014 19:23

## 2015-02-08 NOTE — Consult Note (Signed)
Brief Consult Note: Diagnosis: Syncope.   Patient was seen by consultant.   Consult note dictated.   Comments: Likely multifactorial (vasovagal, volume depletion and baseline bradycardia).  Agree with holding Propranolol.  Check echo.  Electronic Signatures: Yordy Matton (MD)  (SLorine Bearsigned [phone]-Nov-15 10:53)  Authored: Brief Consult Note   Last Updated: 26-Nov-15 10:53 by Lorine BearsArida, Danyia Borunda (MD)

## 2015-02-12 NOTE — Consult Note (Signed)
PATIENT NAME:  Cheryl Winters, Cheryl Winters MR#:  [id number] DATE OF BIRTH:  [birth year]  DATE OF CONSULTATION:  09/12/2014  PRIMARY CARE PHYSICIAN: Victorino DikeJennifer A. Dan HumphreysWalker, MD.  REQUESTING PHYSICIAN:  Crissie FiguresEdavally N. Reddy, MD.   REASON FOR CONSULTATION:  Presyncope.   HISTORY OF PRESENT ILLNESS: This is a 81 year old Caucasian female with no previous cardiac history. She has known history of hypertension, hyperlipidemia, gastroesophageal reflux disease, degenerative disk disease and tremors. She is on propranolol for essential tremors. Yesterday, she was not feeling well and describe generalized weakness. She was in the kitchen trying to prepare a meal when she felt sick in her stomach. She was trying to walk and all of a sudden felt extremely dizzy and lightheaded followed by a controlled fall to the floor. It appears that she did not [place] out completely, as there was no evidence of significant physical injuries. She had no chest discomfort or shortness of breath. She describes no previous similar episodes. She was noted to have stable vital signs on presentation.  Blood pressure was 118/49 with a heart rate of 60 beats per minute. Carotid Doppler showed no significant disease. Cardiac enzymes were negative. Labs were remarkable for mildly elevated BUN and urinary tract infection. She feels better.   PAST MEDICAL HISTORY: 1.  Hypertension.  2.  Hyperlipidemia.  3.  Gastroesophageal reflux disease.  4.  Degenerative disk disease.  5.  Essential tremors.   ALLERGIES: PENICILLIN, AUGMENTIN, OXYCODONE AND CODEINE.   HOME MEDICATIONS INCLUDE: 1.  Fish oil.  2.  Hydralazine 25 mg 2 times daily.  3.  Hydrochlorothiazide 25 mg once daily.  4.  Lisinopril 40 mg once daily.  5.  Os-Cal.  6.  Pravastatin 40 mg daily.  7.  Propranolol extended release 120 mg once daily.  8.  Ultram as needed.   SOCIAL HISTORY: Negative for smoking, alcohol or recreational drug use.   FAMILY HISTORY: Negative for coronary artery disease.    REVIEW OF SYSTEMS: A 10-point review of systems review of systems was performed. It is negative other than what is mentioned in the HPI.   PHYSICAL EXAMINATION: GENERAL: The patient appears to be at her stated age and in no acute distress.  VITAL SIGNS: Temperature is 98.1, pulse 56, respiratory rate 18, blood pressure 162/79, oxygen saturation is 96% on room air.  HEENT: Normocephalic, atraumatic.  NECK: No JVD or carotid bruits.  RESPIRATORY: Normal respiratory effort with no use of accessory muscles. Auscultation reveals normal breath sounds.  CARDIOVASCULAR: Normal PMI. Normal S1 and S2 with no gallops or murmurs.  ABDOMEN: Benign, nontender, and nondistended.  EXTREMITIES: No clubbing, cyanosis, or edema.  SKIN: Warm and dry with no rash.  PSYCHIATRIC: She is alert, oriented x3 with normal mood and affect.   LABORATORY AND DIAGNOSTIC DATA:  BUN was 24 with a creatinine of 0.94. Cardiac enzymes were negative. TSH was normal. CBC was unremarkable. EKG showed sinus bradycardia with a few PACs.   IMPRESSION: 1.  Syncope.  2.  Sinus bradycardia on propranolol.  3.  Hypertension.   RECOMMENDATIONS: I suspect that syncope is likely multifactorial. There is probably a vasovagal component in the setting of nausea and not feeling well. Also, her labs showed mildly elevated BUN and it is possible that she had some orthostatic hypotension. This is in the setting of chronic treatment with the propranolol and underlying sinus bradycardia, which might have contributed. I do not see evidence of structural heart disease by physical exam. I agree with holding propranolol  for now. An echocardiogram was requested and has not been done yet. I do not suspect significant abnormalities. Most likely, the patient can be discharged home with close outpatient followup.    ____________________________ Chelsea Aus. Kirke Corin, MD maa:DT D: 09/12/2014 10:59:37 ET T: 09/12/2014 12:59:14  ET JOB#: [id number]  cc: Jerolyn Center A. Kirke Corin, MD, <Dictator> Ginette Pitman. Dan Humphreys, MD Crissie Figures, MD Jerolyn Center Argentina Donovan MD ELECTRONICALLY SIGNED 10/18/2014 9:32

## 2015-02-12 NOTE — Discharge Summary (Signed)
PATIENT NAME:  Cheryl Winters, Cheryl Winters MR#:  [id number] DATE OF BIRTH:  06-Mar-1934  DATE OF ADMISSION:  09/12/2014 DATE OF DISCHARGE:  09/13/2014  ADMITTING PHYSICIAN: Dr. Betti Cruzeddy.   DISCHARGING PHYSICIAN: Enid Baasadhika Libra Gatz, MD.   PRIMARY CARE PHYSICIAN: Ginette PitmanJennifer A. Dan HumphreysWalker, MD.   CONSULTATIONS IN THE HOSPITAL: JeromeMuhammad A. Kirke CorinArida, MD.   DISCHARGE DIAGNOSES: 1.  Near syncope secondary to vasovagal episode.  2.  Urinary tract infection.  3.  Sinus bradycardia while on propranolol.  4.  Hypertension.  5.  Hyperlipidemia.  6.  Degenerated disk disease.  7.  Gastroesophageal reflux disease.   DISCHARGE HOME MEDICATIONS:  1.  Ultram 50 mg 1 to 2 tablets q. 6 hours p.r.n. for severe pain.  2.  Lisinopril 40 mg p.o. daily.  3.  Hydrochlorothiazide 25 mg p.o. daily.  4.  Pravastatin 40 mg p.o. daily.  5.  Fish oil 1 gram capsule daily.  6.  Os-Cal 500 mg/200 international units daily.  7.  Multivitamin 1 tablet p.o. daily.  8.  Propranolol 60 mg long acting 1 tablet daily.  9.  Levaquin 250 mg p.o. daily for 3 more days.   DISCHARGE DIET: Low-sodium diet.   DISCHARGE ACTIVITY: As tolerated.    FOLLOWUP INSTRUCTIONS: PCP followup in 1 week.   LABORATORY DATA AND IMAGING STUDIES PRIOR TO DISCHARGE: Sodium 141, potassium 3.5, chloride 105, bicarbonate 31, BUN 22, creatinine 0.9, glucose 90, calcium of 9.3. WBC 6.9, hemoglobin 12.6, hematocrit 37.5, platelet count 189,000.  Chest x-ray on admission showing cardiomegaly, no acute cardiopulmonary disease. CT of the head showing no acute intracranial abnormalities. Chronic microvascular small vessel ischemic changes noted. Urinalysis with 2+ leukocyte esterase, WBCs, and trace bacteria noted. Blood cultures remain negative. TSH within normal limits at 2.6. Ultrasound of the carotids bilaterally showing minor atherosclerosis and tortuosity but degree of hemodynamically significant stenosis noted to be less than 50%.  Echo Doppler showing normal LV ejection  fraction, EF of 50% to 55%, impaired relaxation pattern, concentric LVH noted.   BRIEF HOSPITAL COURSE: Cheryl Winters is a very pleasant 81 year old Caucasian female with past medical history significant for essential tremors, hypertension, hyperlipidemia, gastroesophageal reflux disease brought from home secondary to a near syncopal episode.  1.  Near syncope, likely vasovagal episode. The patient also has some dehydration and urinary tract infection on laboratories. She was given IV fluids. Her orthostatics were negative. She was started on Levaquin. Urine cultures not done. Blood cultures are negative. She symptomatically improved. She was monitored on telemetry to rule out cardiac or neurological causes. Ultrasound of the carotids did not show any hemodynamically significant stenosis and echo was normal. Seen also by cardiology for her bradycardia, but deemed not the cause for her weakness.  2.  Sinus bradycardia while on propranolol. She was taking the propranolol for her essential tremor. She is now on 20 mg after stopping the medication. Heart rate improved up to 80s from 50s. She is being discharged on low-dose propranolol at this time 60 mg and can follow up with her PCP at this point.  3.  Urinary tract infection. She will finish off  Levaquin course.   4.  All her other home medications are being continued without any changes.   DISCHARGE CONDITION: Stable.   DISCHARGE DISPOSITION: Home.   TIME SPENT ON DISCHARGE: 45 minutes.      ____________________________ Enid Baasadhika Anjenette Gerbino, MD rk:at D: 09/13/2014 13:19:44 ET T: 09/13/2014 15:02:57 ET JOB#: [id number]  cc: Enid Baasadhika Trevin Gartrell, MD, <Dictator> Ginette PitmanJennifer A. Dan HumphreysWalker, MD  Enid Baas MD ELECTRONICALLY SIGNED 10/22/2014 13:35

## 2015-03-05 ENCOUNTER — Telehealth: Payer: Self-pay

## 2015-03-05 NOTE — Telephone Encounter (Signed)
Patient came into the office requesting a BP check.  Went to speak to the patient.  Patient stated that she was at a meeting this morning and when she left the meeting she wasn't feeling well.  In the parking lot was the local ire department.  She requested that they check her BP.  Their results were 200/110 (at 1040am).  They told her to go to see her PCP.  She decided to go eat lunch with her friends and then when she got home she started to feel bad again which prompted her to come in here.  Based on her symptoms I sent her to a urgent care as we had no appointments.  I asked if she needed transportation she stated she would drive straight to the [facility] urgent care.  FYI.

## 2015-05-01 ENCOUNTER — Encounter: Payer: Self-pay | Admitting: Internal Medicine

## 2015-05-01 ENCOUNTER — Ambulatory Visit (INDEPENDENT_AMBULATORY_CARE_PROVIDER_SITE_OTHER): Payer: Medicare Other | Admitting: Internal Medicine

## 2015-05-01 ENCOUNTER — Other Ambulatory Visit (INDEPENDENT_AMBULATORY_CARE_PROVIDER_SITE_OTHER): Payer: Medicare Other

## 2015-05-01 ENCOUNTER — Encounter (INDEPENDENT_AMBULATORY_CARE_PROVIDER_SITE_OTHER): Payer: Self-pay

## 2015-05-01 VITALS — BP 117/69 | HR 58 | Temp 98.1°F | Ht 60.0 in | Wt 174.4 lb

## 2015-05-01 DIAGNOSIS — W19XXXA Unspecified fall, initial encounter: Secondary | ICD-10-CM

## 2015-05-01 DIAGNOSIS — Y92009 Unspecified place in unspecified non-institutional (private) residence as the place of occurrence of the external cause: Secondary | ICD-10-CM

## 2015-05-01 DIAGNOSIS — Y92099 Unspecified place in other non-institutional residence as the place of occurrence of the external cause: Secondary | ICD-10-CM

## 2015-05-01 DIAGNOSIS — I10 Essential (primary) hypertension: Secondary | ICD-10-CM | POA: Diagnosis not present

## 2015-05-01 DIAGNOSIS — N179 Acute kidney failure, unspecified: Secondary | ICD-10-CM

## 2015-05-01 LAB — COMPREHENSIVE METABOLIC PANEL
ALBUMIN: 4 g/dL (ref 3.5–5.2)
ALT: 12 U/L (ref 0–35)
AST: 19 U/L (ref 0–37)
Alkaline Phosphatase: 70 U/L (ref 39–117)
BUN: 32 mg/dL — AB (ref 6–23)
CALCIUM: 9.6 mg/dL (ref 8.4–10.5)
CHLORIDE: 104 meq/L (ref 96–112)
CO2: 30 meq/L (ref 19–32)
Creatinine, Ser: 1.06 mg/dL (ref 0.40–1.20)
GFR: 53.11 mL/min — ABNORMAL LOW (ref >60.00)
Glucose, Bld: 65 mg/dL — ABNORMAL LOW (ref 70–99)
Potassium: 3.9 mEq/L (ref 3.5–5.1)
Sodium: 141 mEq/L (ref 135–145)
Total Bilirubin: 0.8 mg/dL (ref 0.2–1.2)
Total Protein: 6.8 g/dL (ref 6.0–8.3)

## 2015-05-01 NOTE — Assessment & Plan Note (Signed)
Recent fall at home. Small skin tear left forearm appears to be improving. Encouraged falls prevention. Encouraged her to keep cell phone with her at all times.

## 2015-05-01 NOTE — Patient Instructions (Signed)
Continue current medications.  Labs today. 

## 2015-05-01 NOTE — Progress Notes (Signed)
   Subjective:    Patient ID: Cheryl Winters, female    DOB: Mar 13, 1934, 81 y.o.   MRN: [id number]  HPI  81YO female presents for follow up.  Last seen 01/2015.  Recently fell when working in her yard. Tripped on her flip flop. No noted injury. However, had to sit and wait for help.  Aside from this feeling well. No further nausea. No syncopal episodes. No chest pain palpitations. Compliant with medication.  Past medical, surgical, family and social history per today's encounter.  Review of Systems  Constitutional: Negative for fever, chills, appetite change, fatigue and unexpected weight change.  Eyes: Negative for visual disturbance.  Respiratory: Negative for shortness of breath.   Cardiovascular: Negative for chest pain and leg swelling.  Gastrointestinal: Negative for nausea, vomiting, abdominal pain, diarrhea and constipation.  Musculoskeletal: Negative for myalgias and arthralgias.  Skin: Positive for wound. Negative for color change and rash.  Neurological: Negative for syncope, weakness and headaches.  Hematological: Negative for adenopathy. Does not bruise/bleed easily.  Psychiatric/Behavioral: Negative for dysphoric mood. The patient is not nervous/anxious.        Objective:    BP 117/69 mmHg  Pulse 58  Temp(Src) 98.1 F (36.7 C) (Oral)  Ht 5' (1.524 m)  Wt 174 lb 6 oz (79.096 kg)  BMI 34.06 kg/m2  SpO2 97% Physical Exam  Constitutional: She is oriented to person, place, and time. She appears well-developed and well-nourished. No distress.  HENT:  Head: Normocephalic and atraumatic.  Right Ear: External ear normal.  Left Ear: External ear normal.  Nose: Nose normal.  Mouth/Throat: Oropharynx is clear and moist. No oropharyngeal exudate.  Eyes: Conjunctivae are normal. Pupils are equal, round, and reactive to light. Right eye exhibits no discharge. Left eye exhibits no discharge. No scleral icterus.  Neck: Normal range of motion. Neck supple. No tracheal  deviation present. No thyromegaly present.  Cardiovascular: Normal rate, regular rhythm, normal heart sounds and intact distal pulses.  Exam reveals no gallop and no friction rub.   No murmur heard. Pulmonary/Chest: Effort normal and breath sounds normal. No respiratory distress. She has no wheezes. She has no rales. She exhibits no tenderness.  Musculoskeletal: Normal range of motion. She exhibits no edema or tenderness.  Lymphadenopathy:    She has no cervical adenopathy.  Neurological: She is alert and oriented to person, place, and time. No cranial nerve deficit. She exhibits normal muscle tone. Coordination normal.  Skin: Skin is warm and dry. No rash noted. She is not diaphoretic. No erythema. No pallor.     Psychiatric: She has a normal mood and affect. Her behavior is normal. Judgment and thought content normal.          Assessment & Plan:   Problem List Items Addressed This Visit      Unprioritized   Fall at home    Recent fall at home. Small skin tear left forearm appears to be improving. Encouraged falls prevention. Encouraged her to keep cell phone with her at all times.      Hypertension - Primary    BP Readings from Last 3 Encounters:  05/01/15 117/69  01/30/15 127/79  11/18/14 124/80   BP well controlled on current medications. Will check renal function with labs today.      Relevant Orders   Comprehensive metabolic panel       Return in about 5 months (around 10/01/2015) for Wellness Visit.

## 2015-05-01 NOTE — Progress Notes (Signed)
Pre visit review using our clinic review tool, if applicable. No additional management support is needed unless otherwise documented below in the visit note. 

## 2015-05-01 NOTE — Assessment & Plan Note (Signed)
BP Readings from Last 3 Encounters:  05/01/15 117/69  01/30/15 127/79  11/18/14 124/80   BP well controlled on current medications. Will check renal function with labs today.

## 2015-05-02 ENCOUNTER — Telehealth: Payer: Self-pay | Admitting: Internal Medicine

## 2015-05-02 NOTE — Telephone Encounter (Signed)
Pt called requesting lab results.  Spoke with pt advised of lab results and scheduled lab appoint.  Pt verbalized understanding

## 2015-05-05 ENCOUNTER — Other Ambulatory Visit (INDEPENDENT_AMBULATORY_CARE_PROVIDER_SITE_OTHER): Payer: Medicare Other

## 2015-05-05 ENCOUNTER — Telehealth: Payer: Self-pay | Admitting: *Deleted

## 2015-05-05 DIAGNOSIS — N179 Acute kidney failure, unspecified: Secondary | ICD-10-CM | POA: Diagnosis not present

## 2015-05-05 LAB — BASIC METABOLIC PANEL
BUN: 24 mg/dL — ABNORMAL HIGH (ref 6–23)
CALCIUM: 10 mg/dL (ref 8.4–10.5)
CO2: 29 mEq/L (ref 19–32)
CREATININE: 0.75 mg/dL (ref 0.40–1.20)
Chloride: 105 mEq/L (ref 96–112)
GFR: 79.16 mL/min (ref >60.00)
GLUCOSE: 86 mg/dL (ref 70–99)
Potassium: 4.1 mEq/L (ref 3.5–5.1)
Sodium: 142 mEq/L (ref 135–145)

## 2015-05-05 NOTE — Telephone Encounter (Signed)
BMP for acute renal failure 

## 2015-05-05 NOTE — Telephone Encounter (Signed)
Labs and dx?  

## 2015-05-15 ENCOUNTER — Ambulatory Visit (INDEPENDENT_AMBULATORY_CARE_PROVIDER_SITE_OTHER): Payer: Medicare Other | Admitting: Cardiovascular Disease

## 2015-05-15 ENCOUNTER — Encounter: Payer: Self-pay | Admitting: Cardiovascular Disease

## 2015-05-15 VITALS — BP 120/82 | HR 56 | Ht 60.0 in | Wt 176.0 lb

## 2015-05-15 DIAGNOSIS — R0789 Other chest pain: Secondary | ICD-10-CM | POA: Insufficient documentation

## 2015-05-15 DIAGNOSIS — I10 Essential (primary) hypertension: Secondary | ICD-10-CM | POA: Diagnosis not present

## 2015-05-15 DIAGNOSIS — R001 Bradycardia, unspecified: Secondary | ICD-10-CM

## 2015-05-15 NOTE — Assessment & Plan Note (Signed)
Another recent presyncopal episode was likely due to orthostatic hypotension in the setting of treatment with anti-hypertensive medications including beta blocker and a diuretic. She was working during the day and the hot weather. I asked her to avoid such activities and to avoid sudden standing up. If these episodes become more frequent, a 30 day outpatient telemetry can be considered to make sure she is not having intermittent bradycardia.

## 2015-05-15 NOTE — Assessment & Plan Note (Signed)
Likely GI in nature. These episodes are brief and not frequent at all.  Unlikely to be cardiac in etiology but I did offer her a pharmacologic nuclear stress test. The patient prefers to monitor her symptoms and report to [place] if they become more frequent.

## 2015-05-15 NOTE — Assessment & Plan Note (Signed)
Blood pressure is well controlled on current medications. 

## 2015-05-15 NOTE — Progress Notes (Signed)
Primary care physician: Dr. Victorino Dike walker  HPI  This is a 81 year old female who is here today for a follow-up visit regarding presyncope and bradycardia. She has known history of hypertension, hyperlipidemia, GERD, arthritis and essential tremors. She was hospitalized in November at [facility] for presyncope. She was in the kitchen trying to prepare a meal when she felt sick in her stomach followed by dizziness and a fall. She did not [place] out completely. Labs showed mildly elevated BUN she was also found to have urinary tract infection. She was noted to have sinus bradycardia with heart rate in the 50s. It was felt that her syncope was multifactorial due to mild volume depletion, UTI and a vasovagal component. Background sinus bradycardia in the setting of treatment with propranolol for tremors probably contributed. The dose of propranolol was decreased to 60 mg. Carotid Doppler showed no evidence of obstructive disease. Echocardiogram showed an ejection fraction of 50-55% with mild to moderate mitral regurgitation, mild tricuspid regurgitation and no significant aortic stenosis.  I decreased the dose of propranolol 20 mg twice daily. Holter monitor showed short runs of SVT with occasional sinus bradycardia. However, mean heart rate was in the 60s. Lowest heart rate was 47 at 3 AM. She fell 2 weeks ago while she was working in the garden in the hot weather. She was bending to cut some flowers and suddenly she felt lightheaded with dizziness. She does not think she passed out completely but she did fall and had some bruising in her left arm and right leg. No recurrent episodes since then. She does complain of rare episodes of lower sternal chest chest tightness lasting for less than a minute. His happens at rest and usually one or at most 2 episodes in a month. These episodes are not exertional.   Allergies  Allergen Reactions  . Amlodipine Other (See Comments)    malaise malaise  . Amoxicillin-Pot  Clavulanate     Other reaction(s): Other (See Comments) Cannot remember.  . Penicillins   . Codeine Rash     Current Outpatient Prescriptions on File Prior to Visit  Medication Sig Dispense Refill  . Biotin 5000 MCG TABS Take by mouth daily.    . hydrochlorothiazide (HYDRODIURIL) 25 MG tablet Take 1 tablet (25 mg total) by mouth daily. 90 tablet 3  . lisinopril (PRINIVIL,ZESTRIL) 40 MG tablet Take 1 tablet (40 mg total) by mouth daily. 90 tablet 3  . Multiple Vitamins-Minerals (MULTIVITAMIN WITH MINERALS) tablet Take 1 tablet by mouth daily.    . propranolol (INDERAL) 20 MG tablet Take 1 tablet (20 mg total) by mouth 2 (two) times daily. 180 tablet 3   No current facility-administered medications on file prior to visit.     Past Medical History  Diagnosis Date  . Hyperlipidemia   . Ulnar nerve lesion   . Vitamin D deficiency   . HTN (hypertension)   . Carpal tunnel syndrome   . GERD (gastroesophageal reflux disease)   . Benign essential tremor   . DJD (degenerative joint disease) of knee   . Abnormal stress test     w/fixed defect  . Abdominal pain, right upper quadrant   . UTI (urinary tract infection)   . Syncope and collapse      Past Surgical History  Procedure Laterality Date  . Vaginal delivery      1 set Twins/2 single births  . Breast cyst excision      x 2 - nml path per pt  .  Umbilical hernia repair    . Total knee arthroplasty      bilateral, total, [facility]  . Total shoulder replacement  08/2010    left  . Carpal tunnel release  2004  . Breast biopsy  1957 and 1962    x 2, Nml per pt  . Tubal ligation    . Cataract extraction    . Bilateral bletharoplastices       Family History  Problem Relation Age of Onset  . Hypertension Mother   . Arthritis Mother   . Stroke Mother   . Dementia Mother   . Early death Father 42    from brain cancer  . Brain cancer Father 67  . Breast cancer Maternal Aunt   . Alcohol abuse Other      History     Social History  . Marital Status: Widowed    Spouse Name: N/A  . Number of Children: 4  . Years of Education: N/A   Occupational History  . Retired - Biomedical scientist    Social History Main Topics  . Smoking status: Never Smoker   . Smokeless tobacco: Never Used  . Alcohol Use: No  . Drug Use: No  . Sexual Activity: Not on file   Other Topics Concern  . Not on file   Social History Narrative   Lives alone in [place]. Widow. Has children who live nearby.     PHYSICAL EXAM   BP 120/82 mmHg  Pulse 56  Ht 5' (1.524 m)  Wt 176 lb (79.833 kg)  BMI 34.37 kg/m2  Constitutional: She is oriented to person, place, and time. She appears well-developed and well-nourished. No distress.  HENT: No nasal discharge.  Head: Normocephalic and atraumatic.  Eyes: Pupils are equal and round. No discharge.  Neck: Normal range of motion. Neck supple. No JVD present. No thyromegaly present.  Cardiovascular: Normal rate, regular rhythm, normal heart sounds. Exam reveals no gallop and no friction rub. No murmur heard.  Pulmonary/Chest: Effort normal and breath sounds normal. No stridor. No respiratory distress. She has no wheezes. She has no rales. She exhibits no tenderness.  Abdominal: Soft. Bowel sounds are normal. She exhibits no distension. There is no tenderness. There is no rebound and no guarding.  Musculoskeletal: Normal range of motion. She exhibits no edema and no tenderness.  Neurological: She is alert and oriented to person, place, and time. Coordination normal.  Skin: Skin is warm and dry. No rash noted. She is not diaphoretic. No erythema. No pallor.  Psychiatric: She has a normal mood and affect. Her behavior is normal. Judgment and thought content normal.    EKG : Sinus  Bradycardia  Low voltage in precordial leads.   -Old inferior infarct.   ABNORMAL    ASSESSMENT AND PLAN

## 2015-05-15 NOTE — Patient Instructions (Signed)
Medication Instructions:  Your physician recommends that you continue on your current medications as directed. Please refer to the Current Medication list given to you today.   Labwork: none  Testing/Procedures: none  Follow-Up: Your physician wants you to follow-up in: six months with Dr. Arida.  You will receive a reminder letter in the mail two months in advance. If you don't receive a letter, please call our office to schedule the follow-up appointment.   Any Other Special Instructions Will Be Listed Below (If Applicable).   

## 2015-08-26 ENCOUNTER — Telehealth: Payer: Self-pay

## 2015-08-26 NOTE — Telephone Encounter (Signed)
S/w pt who states she has been dizzy off and on for two days. Noticed more today. Reports dizzy spells a few months ago. Went to PCP who she states did not do anything. She takes BP "sometimes" but says she doesn't really know what she's looking for. BP yesterday: 144/77, 129/73, 107/57. She does not know how to check HR. Reviewed how to do this. Pt tried to count beats while I walked her through the process. Reports HR of 58. Reviewed medications, states she is taking as directed with no missed or extra doses. No new medications added. Advised pt to rise slowly to help dizzy spells, increase fluids, elevate feet, continue to monitor HR and BP and report if symptoms do not improve.  Advised pt to notify PCP as well. Pt agreeable w/plan, verbalized understanding, and thanked me for the call.

## 2015-08-26 NOTE — Telephone Encounter (Signed)
Pt states for the last 2 days she has felt dizzy off and on. No other symptoms. Not sure if this is related to her heart. Please call.

## 2015-10-01 ENCOUNTER — Other Ambulatory Visit: Payer: Self-pay | Admitting: Internal Medicine

## 2015-10-17 ENCOUNTER — Telehealth: Payer: Self-pay | Admitting: Cardiovascular Disease

## 2015-10-17 NOTE — Telephone Encounter (Signed)
Patient has funny feeling in head doesn't feel normal .  Feels lightheaded at times.  Feels fuzzy.   Patient has appt 11/18/15 but not sure if she should wait.  Please call to discuss.

## 2015-10-17 NOTE — Telephone Encounter (Signed)
Front desk called stating that pt is symptomatic on the phone. Advised that there is no doctor in the office today, for her to proceed to ED if she feels sx are emergent.  Pt was advised during phone call 08/26/15, to discuss sx w/ PCP. Advised that she call their office to see if they have an opening for pt to be evaluated, as Dr. Mariah MillingGollan is out sick and Dr. Kirke CorinArida is on vacation.

## 2015-10-21 ENCOUNTER — Telehealth: Payer: Self-pay

## 2015-10-21 NOTE — Telephone Encounter (Signed)
S/w pt who states she had an "episode"  Of feeling "funny" Dec 30. Reports it lasted a couple of hours but resolved once she laid down. She had another episode yesterday. Lasted 15-30 minutes, eased off, then recurred. States it is difficult to explain. Refers to feeling as "woozy but it is difficult to pinpoint" and it is making her feel depressed.  Was at [employer] today and felt dizzy standing in line.  She has a BP cuff at home but is unsure how to use it. States a nurse lives across the street and could possibly help. Advised pt to ask neighbor to teach her how to use BP cuff and monitor VS when she feels dizzy.  Suggested she keep record of what she was doing at the time of sx. Reviewed medication list. Instructed pt to keep Friday appt w/PCP and notify us if BP and/or HR is low. Pt verbalized understanding with no further questions and is appreciative of call.

## 2015-10-21 NOTE — Telephone Encounter (Signed)
Daughter calling back to check on status of patients call below.  Daughter advised of previous directions .  Patient daughter says pcp is not good and not helpful.  They feel like the pcp office is an assembly line where they get checked in check bp and send her on her way.  Wants to know if dr. Kirke CorinArida has  Suggestion for different pcp.    Let daughter know that patient is on waitlist and to seek emergency tx if needed.

## 2015-10-21 NOTE — Telephone Encounter (Signed)
See below

## 2015-10-24 ENCOUNTER — Ambulatory Visit (INDEPENDENT_AMBULATORY_CARE_PROVIDER_SITE_OTHER): Payer: Medicare Other

## 2015-10-24 VITALS — BP 140/88 | HR 60 | Temp 97.1°F | Resp 14 | Ht 60.0 in | Wt 174.0 lb

## 2015-10-24 DIAGNOSIS — Z0000 Encounter for general adult medical examination without abnormal findings: Secondary | ICD-10-CM

## 2015-10-24 NOTE — Progress Notes (Signed)
Annual Wellness Visit as completed by Health Coach was reviewed in full.  

## 2015-10-24 NOTE — Patient Instructions (Addendum)
Cheryl Winters,  Thank you for taking time to come for your Medicare Wellness Visit.  I appreciate your ongoing commitment to your health goals. Please review the following plan we discussed and let me know if I can assist you in the future.  Return in 1 week for follow up.  Happy New Year!  Health Maintenance, Female Adopting a healthy lifestyle and getting preventive care can go a long way to promote health and wellness. Talk with your health care provider about what schedule of regular examinations is right for you. This is a good chance for you to check in with your provider about disease prevention and staying healthy. In between checkups, there are plenty of things you can do on your own. Experts have done a lot of research about which lifestyle changes and preventive measures are most likely to keep you healthy. Ask your health care provider for more information. WEIGHT AND DIET  Eat a healthy diet  Be sure to include plenty of vegetables, fruits, low-fat dairy products, and lean protein.  Do not eat a lot of foods high in solid fats, added sugars, or salt.  Get regular exercise. This is one of the most important things you can do for your health.  Most adults should exercise for at least 150 minutes each week. The exercise should increase your heart rate and make you sweat (moderate-intensity exercise).  Most adults should also do strengthening exercises at least twice a week. This is in addition to the moderate-intensity exercise.  Maintain a healthy weight  Body mass index (BMI) is a measurement that can be used to identify possible weight problems. It estimates body fat based on height and weight. Your health care provider can help determine your BMI and help you achieve or maintain a healthy weight.  For females 13 years of age and older:   A BMI below 18.5 is considered underweight.  A BMI of 18.5 to 24.9 is normal.  A BMI of 25 to 29.9 is considered overweight.  A BMI of  30 and above is considered obese.  Watch levels of cholesterol and blood lipids  You should start having your blood tested for lipids and cholesterol at 80 years of age, then have this test every 5 years.  You may need to have your cholesterol levels checked more often if:  Your lipid or cholesterol levels are high.  You are older than 80 years of age.  You are at high risk for heart disease.  CANCER SCREENING   Lung Cancer  Lung cancer screening is recommended for adults 37-41 years old who are at high risk for lung cancer because of a history of smoking.  A yearly low-dose CT scan of the lungs is recommended for people who:  Currently smoke.  Have quit within the past 15 years.  Have at least a 30-pack-year history of smoking. A pack year is smoking an average of one pack of cigarettes a day for 1 year.  Yearly screening should continue until it has been 15 years since you quit.  Yearly screening should stop if you develop a health problem that would prevent you from having lung cancer treatment.  Breast Cancer  Practice breast self-awareness. This means understanding how your breasts normally appear and feel.  It also means doing regular breast self-exams. Let your health care provider know about any changes, no matter how small.  If you are in your 20s or 30s, you should have a clinical breast exam (CBE) by  a health care provider every 1-3 years as part of a regular health exam.  If you are 3 or older, have a CBE every year. Also consider having a breast X-ray (mammogram) every year.  If you have a family history of breast cancer, talk to your health care provider about genetic screening.  If you are at high risk for breast cancer, talk to your health care provider about having an MRI and a mammogram every year.  Breast cancer gene (BRCA) assessment is recommended for women who have family members with BRCA-related cancers. BRCA-related cancers  include:  Breast.  Ovarian.  Tubal.  Peritoneal cancers.  Results of the assessment will determine the need for genetic counseling and BRCA1 and BRCA2 testing. Cervical Cancer Your health care provider may recommend that you be screened regularly for cancer of the pelvic organs (ovaries, uterus, and vagina). This screening involves a pelvic examination, including checking for microscopic changes to the surface of your cervix (Pap test). You may be encouraged to have this screening done every 3 years, beginning at age 87.  For women ages 10-65, health care providers may recommend pelvic exams and Pap testing every 3 years, or they may recommend the Pap and pelvic exam, combined with testing for human papilloma virus (HPV), every 5 years. Some types of HPV increase your risk of cervical cancer. Testing for HPV may also be done on women of any age with unclear Pap test results.  Other health care providers may not recommend any screening for nonpregnant women who are considered low risk for pelvic cancer and who do not have symptoms. Ask your health care provider if a screening pelvic exam is right for you.  If you have had past treatment for cervical cancer or a condition that could lead to cancer, you need Pap tests and screening for cancer for at least 20 years after your treatment. If Pap tests have been discontinued, your risk factors (such as having a new sexual partner) need to be reassessed to determine if screening should resume. Some women have medical problems that increase the chance of getting cervical cancer. In these cases, your health care provider may recommend more frequent screening and Pap tests. Colorectal Cancer  This type of cancer can be detected and often prevented.  Routine colorectal cancer screening usually begins at 80 years of age and continues through 80 years of age.  Your health care provider may recommend screening at an earlier age if you have risk factors for  colon cancer.  Your health care provider may also recommend using home test kits to check for hidden blood in the stool.  A small camera at the end of a tube can be used to examine your colon directly (sigmoidoscopy or colonoscopy). This is done to check for the earliest forms of colorectal cancer.  Routine screening usually begins at age 61.  Direct examination of the colon should be repeated every 5-10 years through 80 years of age. However, you may need to be screened more often if early forms of precancerous polyps or small growths are found. Skin Cancer  Check your skin from head to toe regularly.  Tell your health care provider about any new moles or changes in moles, especially if there is a change in a mole's shape or color.  Also tell your health care provider if you have a mole that is larger than the size of a pencil eraser.  Always use sunscreen. Apply sunscreen liberally and repeatedly throughout the day.  Protect yourself by wearing long sleeves, pants, a wide-brimmed hat, and sunglasses whenever you are outside. HEART DISEASE, DIABETES, AND HIGH BLOOD PRESSURE   High blood pressure causes heart disease and increases the risk of stroke. High blood pressure is more likely to develop in:  People who have blood pressure in the high end of the normal range (130-139/85-89 mm Hg).  People who are overweight or obese.  People who are African American.  If you are 18-39 years of age, have your blood pressure checked every 3-5 years. If you are 40 years of age or older, have your blood pressure checked every year. You should have your blood pressure measured twice--once when you are at a hospital or clinic, and once when you are not at a hospital or clinic. Record the average of the two measurements. To check your blood pressure when you are not at a hospital or clinic, you can use:  An automated blood pressure machine at a pharmacy.  A home blood pressure monitor.  If you  are between 55 years and 79 years old, ask your health care provider if you should take aspirin to prevent strokes.  Have regular diabetes screenings. This involves taking a blood sample to check your fasting blood sugar level.  If you are at a normal weight and have a low risk for diabetes, have this test once every three years after 80 years of age.  If you are overweight and have a high risk for diabetes, consider being tested at a younger age or more often. PREVENTING INFECTION  Hepatitis B  If you have a higher risk for hepatitis B, you should be screened for this virus. You are considered at high risk for hepatitis B if:  You were born in a country where hepatitis B is common. Ask your health care provider which countries are considered high risk.  Your parents were born in a high-risk country, and you have not been immunized against hepatitis B (hepatitis B vaccine).  You have HIV or AIDS.  You use needles to inject street drugs.  You live with someone who has hepatitis B.  You have had sex with someone who has hepatitis B.  You get hemodialysis treatment.  You take certain medicines for conditions, including cancer, organ transplantation, and autoimmune conditions. Hepatitis C  Blood testing is recommended for:  Everyone born from 1945 through 1965.  Anyone with known risk factors for hepatitis C. Sexually transmitted infections (STIs)  You should be screened for sexually transmitted infections (STIs) including gonorrhea and chlamydia if:  You are sexually active and are younger than 80 years of age.  You are older than 80 years of age and your health care provider tells you that you are at risk for this type of infection.  Your sexual activity has changed since you were last screened and you are at an increased risk for chlamydia or gonorrhea. Ask your health care provider if you are at risk.  If you do not have HIV, but are at risk, it may be recommended that you  take a prescription medicine daily to prevent HIV infection. This is called pre-exposure prophylaxis (PrEP). You are considered at risk if:  You are sexually active and do not regularly use condoms or know the HIV status of your partner(s).  You take drugs by injection.  You are sexually active with a partner who has HIV. Talk with your health care provider about whether you are at high risk of being infected   with HIV. If you choose to begin PrEP, you should first be tested for HIV. You should then be tested every 3 months for as long as you are taking PrEP.  PREGNANCY   If you are premenopausal and you may become pregnant, ask your health care provider about preconception counseling.  If you may become pregnant, take 400 to 800 micrograms (mcg) of folic acid every day.  If you want to prevent pregnancy, talk to your health care provider about birth control (contraception). OSTEOPOROSIS AND MENOPAUSE   Osteoporosis is a disease in which the bones lose minerals and strength with aging. This can result in serious bone fractures. Your risk for osteoporosis can be identified using a bone density scan.  If you are 28 years of age or older, or if you are at risk for osteoporosis and fractures, ask your health care provider if you should be screened.  Ask your health care provider whether you should take a calcium or vitamin D supplement to lower your risk for osteoporosis.  Menopause may have certain physical symptoms and risks.  Hormone replacement therapy may reduce some of these symptoms and risks. Talk to your health care provider about whether hormone replacement therapy is right for you.  HOME CARE INSTRUCTIONS   Schedule regular health, dental, and eye exams.  Stay current with your immunizations.   Do not use any tobacco products including cigarettes, chewing tobacco, or electronic cigarettes.  If you are pregnant, do not drink alcohol.  If you are breastfeeding, limit how  much and how often you drink alcohol.  Limit alcohol intake to no more than 1 drink per day for nonpregnant women. One drink equals 12 ounces of beer, 5 ounces of wine, or 1 ounces of hard liquor.  Do not use street drugs.  Do not share needles.  Ask your health care provider for help if you need support or information about quitting drugs.  Tell your health care provider if you often feel depressed.  Tell your health care provider if you have ever been abused or do not feel safe at home.   This information is not intended to replace advice given to you by your health care provider. Make sure you discuss any questions you have with your health care provider.   Document Released: 04/19/2011 Document Revised: 10/25/2014 Document Reviewed: 09/05/2013 Elsevier Interactive Patient Education [employer].

## 2015-10-24 NOTE — Progress Notes (Signed)
Subjective:   Cheryl LoganRita Steele Winters is a 82 y.o. female who presents for Medicare Annual (Subsequent) preventive examination.  Review of Systems:  No ROS.  Medicare Wellness Visit.  Cardiac Risk Factors include: advanced age (>855men, 51>65 women);hypertension     Objective:     Vitals: BP 140/88 mmHg  Pulse 60  Temp(Src) 97.1 F (36.2 C) (Oral)  Resp 14  Ht 5' (1.524 m)  Wt 174 lb (78.926 kg)  BMI 33.98 kg/m2  SpO2 96%  Tobacco History  Smoking status  . Never Smoker   Smokeless tobacco  . Never Used     Counseling given: Not Answered   Past Medical History  Diagnosis Date  . Hyperlipidemia   . Ulnar nerve lesion   . Vitamin D deficiency   . HTN (hypertension)   . Carpal tunnel syndrome   . GERD (gastroesophageal reflux disease)   . Benign essential tremor   . DJD (degenerative joint disease) of knee   . Abnormal stress test     w/fixed defect  . Abdominal pain, right upper quadrant   . UTI (urinary tract infection)   . Syncope and collapse    Past Surgical History  Procedure Laterality Date  . Vaginal delivery      1 set Twins/2 single births  . Breast cyst excision      x 2 - nml path per pt  . Umbilical hernia repair    . [place]  . Total shoulder replacement  08/2010    left  . Carpal tunnel release  2004  . Breast biopsy  1957 and 1962    x 2, Nml per pt  . Tubal ligation    . Cataract extraction    . Bilateral bletharoplastices     Family History  Problem Relation Age of Onset  . Hypertension Mother   . Arthritis Mother   . Stroke Mother   . Dementia Mother   . Early death Father [id number]    from brain cancer  . Brain cancer Father [id number]  . Breast cancer Maternal Aunt   . Alcohol abuse Other    History  Sexual Activity  . Sexual Activity: Not Currently    Outpatient Encounter Prescriptions as of 10/24/2015  Medication Sig  . Biotin 5000 MCG TABS Take by mouth daily.  .  hydrochlorothiazide (HYDRODIURIL) 25 MG tablet TAKE 1 TABLET DAILY  . lisinopril (PRINIVIL,ZESTRIL) 40 MG tablet TAKE 1 TABLET DAILY  . Multiple Vitamins-Minerals (MULTIVITAMIN WITH MINERALS) tablet Take 1 tablet by mouth daily.  . propranolol (INDERAL) 20 MG tablet Take 1 tablet (20 mg total) by mouth 2 (two) times daily.   No facility-administered encounter medications on file as of 10/24/2015.    Activities of Daily Living In your present state of health, do you have any difficulty performing the following activities: 10/24/2015  Hearing? N  Vision? N  Difficulty concentrating or making decisions? N  Walking or climbing stairs? Y  Dressing or bathing? N  Doing errands, shopping? N  Preparing Food and eating ? N  Using the Toilet? N  In the past six months, have you accidently leaked urine? N  Do you have problems with loss of bowel control? N  Managing your Medications? N  Managing your Finances? N  Housekeeping or managing your Housekeeping? N    Patient Care Team: Shelia MediaJennifer A Walker, MD as PCP - General (Internal Medicine)    Assessment:  This is a routine wellness examination for Cheryl Winters. The goal of the wellness visit is to assist the patient how to close the gaps in care and create a preventative care plan for the patient.   Osteoporosis risk reviewed.  Medications reviewed; taking without issues or barriers.   Safety issues reviewed; smoke detectors in the home. No firearms in the home. Wears seatbelts when driving or riding with others. No violence in the home.  No identified risk were noted; The patient was oriented x 3; appropriate in dress and manner and no objective failures at ADL's or IADL's.   Patient Concerns:  Dizziness; worsening over the last several months.  Nausea x1 month.  Would like to start taking Magnesium.  Deferred to follow up with PCP.  Immediate appointment declined.  Appointment scheduled in 1 week.  Exercise Activities and Dietary  recommendations Current Exercise Habits:: Home exercise routine, Type of exercise: walking, Time (Minutes): 20, Frequency (Times/Week): 4, Weekly Exercise (Minutes/Week): 80, Intensity: Mild  Goals    . Healthy Lifestyle     Drink plenty of water Make healthy food choices Continue exercise regiment      Fall Risk Fall Risk  10/24/2015 09/02/2014 08/02/2013 08/21/2012  Falls in the past year? Yes No No -  Number falls in past yr: 1 - - -  Injury with Fall? No - - -  Risk for fall due to : - - - Impaired mobility  Risk for fall due to (comments): - - - problems with knees  Follow up Education provided;Falls prevention discussed - - -   Depression Screen PHQ 2/9 Scores 10/24/2015 09/02/2014 08/02/2013 08/21/2012  PHQ - 2 Score 0 0 0 0     Cognitive Testing MMSE - Mini Mental State Exam 10/24/2015  Orientation to time 5  Orientation to Place 5  Registration 3  Attention/ Calculation 5  Recall 3  Language- name 2 objects 2  Language- repeat 1  Language- follow 3 step command 3  Language- read & follow direction 1  Write a sentence 0  Write a sentence-comments Tremors  Copy design 0  Copy design-comments Tremors  Total score 28    Immunization History  Administered Date(s) Administered  . Influenza Split 07/22/2011, 06/21/2012  . Influenza Whole 07/10/2013  . Influenza-Unspecified 07/08/2014, 06/23/2015  . Pneumococcal Conjugate-13 03/19/2014  . Pneumococcal Polysaccharide-23 10/19/2003  . Tdap 10/04/2012  . Zoster 09/30/2008   Screening Tests Health Maintenance  Topic Date Due  . INFLUENZA VACCINE  05/18/2016  . TETANUS/TDAP  10/04/2022  . DEXA SCAN  Completed  . ZOSTAVAX  Completed  . PNA vac Low Risk Adult  Completed      Plan:    End of life planning; Advance aging; Advanced directives discussed. Copy of HCPOA/Living Will requested.  Follow up with PCP as needed.   During the course of the visit the patient was educated and counseled about the following  appropriate screening and preventive services:   Vaccines to include Pneumoccal, Influenza, Hepatitis B, Td, Zostavax, HCV  Electrocardiogram  Cardiovascular Disease  Colorectal cancer screening  Bone density screening  Diabetes screening  Glaucoma screening  Mammography/PAP  Nutrition counseling   Patient Instructions (the written plan) was given to the patient.   Ashok Pall, LPN  10/23/1094

## 2015-10-30 ENCOUNTER — Telehealth: Payer: Self-pay | Admitting: Internal Medicine

## 2015-10-30 NOTE — Telephone Encounter (Signed)
Patient Name: Cheryl Winters DOB: 03/13/1934 Initial Comment Caller states was feeling better and cancelled appt w/DR Dan HumphreysWalker; after walking dog became dizzy; comes and goes; recently an ongoing thing; appt had been filled; Nurse Assessment Nurse: Yetta BarreJones, RN, Miranda Date/Time (Eastern Time): 10/30/2015 11:24:07 AM Confirm and document reason for call. If symptomatic, describe symptoms. ---Caller states she has been having dizziness off and on for a few months. She has seen her cardiologist for this x 2 in the last month. Has the patient traveled out of the country within the last 30 days? ---Not Applicable Does the patient have any new or worsening symptoms? ---Yes Will a triage be completed? ---Yes Related visit to physician within the last 2 weeks? ---No Does the PT have any chronic conditions? (i.e. diabetes, asthma, etc.) ---Yes List chronic conditions. ---HTN, irregular heart rhythm Is this a behavioral health or substance abuse call? ---No Guidelines Guideline Title Affirmed Question Affirmed Notes Dizziness - Lightheadedness [1] MODERATE dizziness (e.g., interferes with normal activities) AND [2] has NOT been evaluated by physician for this (Exception: dizziness caused by heat exposure, sudden standing, or poor fluid intake) Final Disposition User See Physician within 24 Hours Yetta BarreJones, RN, Miranda Comments Appt scheduled for tomorrow morning 1/13 7:15am with Dr. Dan HumphreysWalker. Referrals REFERRED TO PCP OFFICE Disagree/Comply: Comply

## 2015-10-31 ENCOUNTER — Ambulatory Visit: Payer: Medicare Other | Admitting: Internal Medicine

## 2015-10-31 ENCOUNTER — Other Ambulatory Visit: Payer: Self-pay | Admitting: *Deleted

## 2015-10-31 ENCOUNTER — Encounter: Payer: Self-pay | Admitting: Internal Medicine

## 2015-10-31 ENCOUNTER — Ambulatory Visit (INDEPENDENT_AMBULATORY_CARE_PROVIDER_SITE_OTHER): Payer: Medicare Other | Admitting: Internal Medicine

## 2015-10-31 VITALS — BP 138/77 | HR 59 | Temp 97.4°F | Wt 174.2 lb

## 2015-10-31 DIAGNOSIS — E785 Hyperlipidemia, unspecified: Secondary | ICD-10-CM

## 2015-10-31 DIAGNOSIS — R42 Dizziness and giddiness: Secondary | ICD-10-CM | POA: Diagnosis not present

## 2015-10-31 DIAGNOSIS — Z79899 Other long term (current) drug therapy: Secondary | ICD-10-CM

## 2015-10-31 LAB — POCT URINALYSIS DIPSTICK
Bilirubin, UA: NEGATIVE
Glucose, UA: NEGATIVE
Ketones, UA: NEGATIVE
NITRITE UA: NEGATIVE
Protein, UA: NEGATIVE
Spec Grav, UA: 1.02
Urobilinogen, UA: 1
pH, UA: 7

## 2015-10-31 LAB — TSH: TSH: 2.12 u[IU]/mL (ref 0.35–4.50)

## 2015-10-31 LAB — CBC WITH DIFFERENTIAL/PLATELET
BASOS ABS: 0 10*3/uL (ref 0.0–0.1)
BASOS PCT: 0.4 % (ref 0.0–3.0)
EOS PCT: 2.7 % (ref 0.0–5.0)
Eosinophils Absolute: 0.1 10*3/uL (ref 0.0–0.7)
HEMATOCRIT: 39.6 % (ref 36.0–46.0)
Hemoglobin: 13.3 g/dL (ref 12.0–15.0)
Lymphocytes Relative: 31.6 % (ref 12.0–46.0)
Lymphs Abs: 1.5 10*3/uL (ref 0.7–4.0)
MCHC: 33.6 g/dL (ref 30.0–36.0)
MCV: 90.3 fl (ref 78.0–100.0)
MONOS PCT: 6.9 % (ref 3.0–12.0)
Monocytes Absolute: 0.3 10*3/uL (ref 0.1–1.0)
NEUTROS ABS: 2.7 10*3/uL (ref 1.4–7.7)
Neutrophils Relative %: 58.4 % (ref 43.0–77.0)
Platelets: 194 10*3/uL (ref 150.0–400.0)
RBC: 4.39 Mil/uL (ref 3.87–5.11)
RDW: 13.8 % (ref 11.5–15.5)
WBC: 4.6 10*3/uL (ref 4.0–10.5)

## 2015-10-31 LAB — COMPREHENSIVE METABOLIC PANEL
ALT: 11 U/L (ref 0–35)
AST: 18 U/L (ref 0–37)
Albumin: 3.8 g/dL (ref 3.5–5.2)
Alkaline Phosphatase: 62 U/L (ref 39–117)
BUN: 17 mg/dL (ref 6–23)
CALCIUM: 9.7 mg/dL (ref 8.4–10.5)
CO2: 33 mEq/L — ABNORMAL HIGH (ref 19–32)
CREATININE: 0.71 mg/dL (ref 0.40–1.20)
Chloride: 103 mEq/L (ref 96–112)
GFR: 84.23 mL/min (ref >60.00)
Glucose, Bld: 93 mg/dL (ref 70–99)
Potassium: 4 mEq/L (ref 3.5–5.1)
Sodium: 142 mEq/L (ref 135–145)
Total Bilirubin: 0.7 mg/dL (ref 0.2–1.2)
Total Protein: 7 g/dL (ref 6.0–8.3)

## 2015-10-31 LAB — VITAMIN B12: Vitamin B-12: 594 pg/mL (ref 211–911)

## 2015-10-31 MED ORDER — SULFAMETHOXAZOLE-TRIMETHOPRIM 400-80 MG PO TABS: 1.0000 | ORAL_TABLET | Freq: Two times a day (BID) | ORAL | Status: DC

## 2015-10-31 NOTE — Assessment & Plan Note (Addendum)
Episodic lightheadedness. Similar to symptoms last year. Exam remarkable for bradycardia and occasional extrasystoles. EKG today shows NSR, rate 61. Labs today. If labs are normal, we discussed additional imaging with MRI brain and carotid dopplers. Follow up here on Monday. Encouraged increased fluid intake, rest.

## 2015-10-31 NOTE — Addendum Note (Signed)
Addended by: Montine CircleMALDONADO, Corby Vandenberghe D on: 10/31/2015 08:38 AM   Modules accepted: Orders

## 2015-10-31 NOTE — Patient Instructions (Addendum)
Labs today.  Follow up Monday.

## 2015-10-31 NOTE — Progress Notes (Signed)
Pre visit review using our clinic review tool, if applicable. No additional management support is needed unless otherwise documented below in the visit note. 

## 2015-10-31 NOTE — Progress Notes (Signed)
Subjective:    Patient ID: Cheryl Winters, female    DOB: June 24, 1934, 82 y.o.   MRN: [id number]  HPI  82YO female presents for acute visit.  Dizziness - Started feeling dizzy about 1-2 months ago. Described as lightheadedness. No syncopal episodes. Symptoms come and go. Worse in last 2-3 days. No trauma to head. No headache. This occurred last year, and she was hospitalized. No chest pain. No NV. No fever chills. No urinary symptoms. No weakness, numbness.   Wt Readings from Last 3 Encounters:  10/31/15 174 lb 4 oz (80.039 kg)  10/24/15 174 lb (80.926 kg)  05/15/15 176 lb (80.833 kg)   BP Readings from Last 3 Encounters:  10/31/15 138/77  10/24/15 140/88  05/15/15 120/82    Past Medical History  Diagnosis Date  . Hyperlipidemia   . Ulnar nerve lesion   . Vitamin D deficiency   . HTN (hypertension)   . Carpal tunnel syndrome   . GERD (gastroesophageal reflux disease)   . Benign essential tremor   . DJD (degenerative joint disease) of knee   . Abnormal stress test     w/fixed defect  . Abdominal pain, right upper quadrant   . UTI (urinary tract infection)   . Syncope and collapse    Family History  Problem Relation Age of Onset  . Hypertension Mother   . Arthritis Mother   . Stroke Mother   . Dementia Mother   . Early death Father [id number]    from brain cancer  . Brain cancer Father [id number]  . Breast cancer Maternal Aunt   . Alcohol abuse Other    Past Surgical History  Procedure Laterality Date  . Vaginal delivery      1 set Twins/2 single births  . Breast cyst excision      x 2 - nml path per pt  . Umbilical hernia repair    . [place]      [place]  . Total shoulder replacement  08/2010    left  . Carpal tunnel release  2004  . Breast biopsy  1957 and 1962    x 2, Nml per pt  . Tubal ligation    . Cataract extraction    . Bilateral bletharoplastices     Social History   Social History  . Marital Status: Widowed     Spouse Name: N/A  . Number of Children: 4  . Years of Education: N/A   Occupational History  . 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 Smoking status: Never Smoker   . Smokeless tobacco: Never Used  . Alcohol Use: No  . Drug Use: No  . Sexual Activity: Not Currently   Other Topics Concern  . None   Social History Narrative   [place]. Has children who live nearby.    Review of Systems  Constitutional: Negative for fever, chills, appetite change, fatigue and unexpected weight change.  HENT: Negative for congestion.   Eyes: Negative for visual disturbance.  Respiratory: Negative for shortness of breath.   Cardiovascular: Negative for chest pain, palpitations and leg swelling.  Gastrointestinal: Negative for nausea, vomiting, abdominal pain, diarrhea and constipation.  Musculoskeletal: Negative for myalgias and arthralgias.  Skin: Negative for color change and rash.  Neurological: Positive for dizziness and light-headedness. Negative for tremors, seizures, syncope, facial asymmetry, speech difficulty, weakness, numbness and headaches.  Hematological: Negative for adenopathy. Does not bruise/bleed easily.  Psychiatric/Behavioral: Negative for sleep disturbance and dysphoric mood. The patient is not nervous/anxious.        Objective:    BP 138/77 mmHg  Pulse 59  Temp(Src) 97.4 F (36.3 C) (Oral)  Wt 174 lb 4 oz (79.039 kg)  SpO2 97% Physical Exam  Constitutional: She is oriented to person, place, and time. She appears well-developed and well-nourished. No distress.  HENT:  Head: Normocephalic and atraumatic.  Right Ear: External ear normal.  Left Ear: External ear normal.  Nose: Nose normal.  Mouth/Throat: Oropharynx is clear and moist. No oropharyngeal exudate.  Eyes: Conjunctivae are normal. Pupils are equal, round, and reactive to light. Right eye exhibits no discharge. Left eye exhibits no discharge. No scleral  icterus.  Neck: Normal range of motion. Neck supple. No tracheal deviation present. No thyromegaly present.  Cardiovascular: Normal rate, regular rhythm, normal heart sounds and intact distal pulses.   Extrasystoles are present. Exam reveals no gallop and no friction rub.   No murmur heard. Pulmonary/Chest: Effort normal and breath sounds normal. No respiratory distress. She has no wheezes. She has no rales. She exhibits no tenderness.  Musculoskeletal: Normal range of motion. She exhibits no edema or tenderness.  Lymphadenopathy:    She has no cervical adenopathy.  Neurological: She is alert and oriented to person, place, and time. No cranial nerve deficit. She exhibits normal muscle tone. Coordination normal.  Skin: Skin is warm and dry. No rash noted. She is not diaphoretic. No erythema. No pallor.  Psychiatric: She has a normal mood and affect. Her behavior is normal. Judgment and thought content normal.          Assessment & Plan:   Problem List Items Addressed This Visit      Unprioritized   Lightheadedness - Primary    Episodic lightheadedness. Similar to symptoms last year. Exam remarkable for bradycardia and occasional extrasystoles. EKG today shows NSR, rate 61. Labs today. If labs are normal, we discussed additional imaging with MRI brain and carotid dopplers. Follow up here on Monday. Encouraged increased fluid intake, rest.      Relevant Orders   CBC with Differential/Platelet   Comprehensive metabolic panel   TSH   B12   POCT Urinalysis Dipstick   EKG 12-Lead (Completed)       Return in about 3 years (around 10/30/2018) for Recheck.

## 2015-11-02 LAB — URINE CULTURE: Colony Count: 85000

## 2015-11-03 ENCOUNTER — Telehealth: Payer: Self-pay

## 2015-11-03 ENCOUNTER — Ambulatory Visit (INDEPENDENT_AMBULATORY_CARE_PROVIDER_SITE_OTHER): Payer: Medicare Other | Admitting: Internal Medicine

## 2015-11-03 ENCOUNTER — Other Ambulatory Visit: Payer: Self-pay

## 2015-11-03 ENCOUNTER — Encounter: Payer: Self-pay | Admitting: Internal Medicine

## 2015-11-03 VITALS — BP 130/82 | HR 57 | Temp 98.2°F | Ht 60.0 in | Wt 175.4 lb

## 2015-11-03 DIAGNOSIS — N3000 Acute cystitis without hematuria: Secondary | ICD-10-CM | POA: Diagnosis not present

## 2015-11-03 DIAGNOSIS — R42 Dizziness and giddiness: Secondary | ICD-10-CM

## 2015-11-03 NOTE — Addendum Note (Signed)
Addended by: Montine CircleMALDONADO, Bayan Kushnir D on: 11/03/2015 10:40 AM   Modules accepted: Orders

## 2015-11-03 NOTE — Patient Instructions (Addendum)
Continue to increase fluid take.  Continue to take Bactrim.  Repeat urine culture today.   We will set up ultrasound of the carotids and also follow up with Dr. Kirke CorinArida.

## 2015-11-03 NOTE — Telephone Encounter (Signed)
S/w pt who is agreeable w/lexi myoview Jan 24, 8:30am Reviewed instructions and informed pt I will put copy in the mail. Pt verbalized understanding with no further questions.

## 2015-11-03 NOTE — Patient Instructions (Signed)
Your physician has requested that you have a lexiscan myoview. For further information please visit [email or web address]. Please follow instruction sheet, as given.  [facility] MYOVIEW  Your caregiver has ordered a Stress Test with nuclear [email or web address]. The purpose of this test is to evaluate the blood supply to your heart muscle. This procedure is referred to as a "Non-Invasive Stress Test." This is because other than having an IV started in your vein, nothing is inserted or "invades" your body. Cardiac stress tests are done to find areas of poor blood flow to the heart by determining the extent of coronary artery disease (CAD). Some patients exercise on a treadmill, which naturally increases the blood flow to your heart, while others who are  unable to walk on a treadmill due to physical limitations have a pharmacologic/chemical stress agent called Lexiscan . This medicine will mimic walking on a treadmill by temporarily increasing your coronary blood flow.   Please note: these test may take anywhere between 2-4 hours to complete  PLE[facility] REPORT TO [facility]  THE VOLUNTEERS AT THE FIRST DESK WILL DIRECT YOU WHERE TO GO  Date of Procedure:_______January 24______  Arrival Time for Procedure:____8:15am__________  Instructions regarding medication:     _xx___:  Hold propranolol  night before procedure and morning of procedure  _xx___:  Hold other medications as follows: Hydrochlorothiazide the morning of your procedure.  PLEASE NOTIFY THE OFFICE AT LEAST 24 HOURS IN ADVANCE IF YOU ARE UNABLE TO KEEP YOUR APPOINTMENT.  [phone] AND  PLEASE NOTIFY [facility] AT [facility] AT LEAST 24 HOURS IN ADVANCE IF YOU ARE UNABLE TO KEEP YOUR APPOINTMENT. [phone]  How to prepare for your Myoview test:   Do not eat or drink after midnight  No caffeine for 24 hours prior to test  No smoking 24 hours prior to test.  Your medication may be taken with water.  If your doctor stopped a  medication because of this test, do not take that medication.  Ladies, please do not wear dresses.  Skirts or pants are appropriate. Please wear a short sleeve shirt.  No perfume, cologne or lotion.  Wear comfortable walking shoes. No heels!          Cardiac Nuclear Scanning A cardiac nuclear scan is used to check your heart for problems, such as the following:  A portion of the heart is not getting enough blood.  Part of the heart muscle has died, which happens with a heart attack.  The heart wall is not working normally.  In this test, a radioactive dye (tracer) is injected into your bloodstream. After the tracer has traveled to your heart, a scanning device is used to measure how much of the tracer is absorbed by or distributed to various areas of your heart. LET [facility] PROVIDER KNOW ABOUT:  Any allergies you have.  All medicines you are taking, including vitamins, herbs, eye drops, creams, and over-the-counter medicines.  Previous problems you or members of your family have had with the use of anesthetics.  Any blood disorders you have.  Previous surgeries you have had.  Medical conditions you have.  RISKS AND COMPLICATIONS Generally, this is a safe procedure. However, as with any procedure, problems can occur. Possible problems include:   Serious chest pain.  Rapid heartbeat.  Sensation of warmth in your chest. This usually passes quickly. BEFORE THE PROCEDURE Ask your health care provider about changing or stopping your regular medicines. PROCEDURE This procedure is usually done at a hospital  and takes 2-4 hours.  An IV tube is inserted into one of your veins.  Your health care provider will inject a small amount of radioactive tracer through the tube.  You will then wait for 20-40 minutes while the tracer travels through your bloodstream.  You will lie down on an exam table so images of your heart can be taken. Images will be taken for about  15-20 minutes.  You will exercise on a treadmill or stationary bike. While you exercise, your heart activity will be monitored with an electrocardiogram (ECG), and your blood pressure will be checked.  If you are unable to exercise, you may be given a medicine to make your heart beat faster.  When blood flow to your heart has peaked, tracer will again be injected through the IV tube.  After 20-40 minutes, you will get back on the exam table and have more images taken of your heart.  When the procedure is over, your IV tube will be removed. AFTER THE PROCEDURE  You will likely be able to leave shortly after the test. Unless your health care provider tells you otherwise, you may return to your normal schedule, including diet, activities, and medicines.  Make sure you find out how and when you will get your test results.   This information is not intended to replace advice given to you by your health care provider. Make sure you discuss any questions you have with your health care provider.   Document Released: 10/29/2004 Document Revised: 10/09/2013 Document Reviewed: 09/12/2013 Elsevier Interactive Patient Education [employer]

## 2015-11-03 NOTE — Progress Notes (Signed)
Subjective:    Patient ID: Cheryl Winters, female    DOB: 04/20/1934, 82 y.o.   MRN: [id number]  HPI  82YO female presents for follow up.  Recently seen for dizziness.  Feeling better. Less dizziness. Feels occasional "woosiness." Increased fluid intake. Appetite good. Walking at home. Occasional chest tightness. Not necessarily associated with lightheadedness. No palpitations.   Wt Readings from Last 3 Encounters:  11/03/15 175 lb 6 oz (79.55 kg)  10/31/15 174 lb 4 oz (79.039 kg)  10/24/15 174 lb (78.926 kg)   BP Readings from Last 3 Encounters:  11/03/15 130/82  10/31/15 138/77  10/24/15 140/88    Past Medical History  Diagnosis Date  . Hyperlipidemia   . Ulnar nerve lesion   . Vitamin D deficiency   . HTN (hypertension)   . Carpal tunnel syndrome   . GERD (gastroesophageal reflux disease)   . Benign essential tremor   . DJD (degenerative joint disease) of knee   . Abnormal stress test     w/fixed defect  . Abdominal pain, right upper quadrant   . UTI (urinary tract infection)   . Syncope and collapse    Family History  Problem Relation Age of Onset  . Hypertension Mother   . Arthritis Mother   . Stroke Mother   . Dementia Mother   . Early death Father [id number]    from brain cancer  . Brain cancer Father 242  . Breast cancer Maternal Aunt   . Alcohol abuse Other    Past Surgical History  Procedure Laterality Date  . Vaginal delivery      1 set Twins/2 single births  . Breast cyst excision      x 2 - nml path per pt  . Umbilical hernia repair    . [place]  . Total shoulder replacement  08/2010    left  . Carpal tunnel release  2004  . Breast biopsy  1957 and 1962    x 2, Nml per pt  . Tubal ligation    . Cataract extraction    . Bilateral bletharoplastices     Social History   Social History  . Marital Status: Widowed    Spouse Name: N/A  . Number of Children: 4  . Years of Education: N/A    Occupational History  . 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 Smoking status: Never Smoker   . Smokeless tobacco: Never Used  . Alcohol Use: No  . Drug Use: No  . Sexual Activity: Not Currently   Other Topics Concern  . None   Social History Narrative   Lives alone in [place]. Widow. Has children who live nearby.    Review of Systems  Constitutional: Negative for fever, chills and fatigue.  Respiratory: Positive for chest tightness. Negative for cough and shortness of breath.   Cardiovascular: Negative for chest pain, palpitations and leg swelling.  Gastrointestinal: Negative for nausea, vomiting, abdominal pain, diarrhea, constipation and rectal pain.  Genitourinary: Negative for dysuria, urgency, frequency, hematuria, flank pain, decreased urine volume, vaginal bleeding, vaginal discharge, difficulty urinating, vaginal pain and pelvic pain.  Neurological: Positive for dizziness and light-headedness. Negative for weakness and headaches.       Objective:    BP 130/82 mmHg  Pulse 57  Temp(Src) 98.2 F (36.8 C) (Oral)  Ht 5' (1.524 m)  Wt 175 lb 6 oz (79.55 kg)  BMI 34.25 kg/m2  SpO2 96% Physical Exam  Constitutional: She is oriented to person, place, and time. She appears well-developed and well-nourished. No distress.  HENT:  Head: Normocephalic and atraumatic.  Right Ear: External ear normal.  Left Ear: External ear normal.  Nose: Nose normal.  Mouth/Throat: Oropharynx is clear and moist. No oropharyngeal exudate.  Eyes: Conjunctivae are normal. Pupils are equal, round, and reactive to light. Right eye exhibits no discharge. Left eye exhibits no discharge. No scleral icterus.  Neck: Normal range of motion. Neck supple. No tracheal deviation present. No thyromegaly present.  Cardiovascular: Normal rate, regular rhythm, normal heart sounds and intact distal pulses.  Exam reveals no gallop and no friction rub.   No murmur  heard. Pulmonary/Chest: Effort normal and breath sounds normal. No respiratory distress. She has no wheezes. She has no rales. She exhibits no tenderness.  Musculoskeletal: Normal range of motion. She exhibits no edema or tenderness.  Lymphadenopathy:    She has no cervical adenopathy.  Neurological: She is alert and oriented to person, place, and time. No cranial nerve deficit. She exhibits normal muscle tone. Coordination normal.  Skin: Skin is warm and dry. No rash noted. She is not diaphoretic. No erythema. No pallor.  Psychiatric: She has a normal mood and affect. Her behavior is normal. Judgment and thought content normal.          Assessment & Plan:   Problem List Items Addressed This Visit      Unprioritized   Acute cystitis without hematuria    Repeat urine culture today as previous showed mixed bacteria. Continue Bactrim.      Lightheadedness - Primary    Symptoms of lightheadedness. UTI appears to be contributing. Will repeat urine culture today. Continue Bactrim. Will also set up carotid dopplers and evaluation with cardiology. Question if stress test might be helpful.      Relevant Orders   US Carotid Duplex Bilateral       Return in about 4 weeks (around 12/01/2015) for Recheck.

## 2015-11-03 NOTE — Assessment & Plan Note (Signed)
Repeat urine culture today as previous showed mixed bacteria. Continue Bactrim.

## 2015-11-03 NOTE — Telephone Encounter (Signed)
Per Dr. Kirke CorinArida:  Can you schedule a Lexiscan Myoview before her visit with me. Let her know that Dr. Dan HumphreysWalker and I discussed this and we feel that it is needed. Thanks.        Left message on machine for patient to contact the office.  S/w pt on cell phone who is agreeable w/lexi myoview. She is at the grocery store and asks to call back when she is home.

## 2015-11-03 NOTE — Assessment & Plan Note (Signed)
Symptoms of lightheadedness. UTI appears to be contributing. Will repeat urine culture today. Continue Bactrim. Will also set up carotid dopplers and evaluation with cardiology. Question if stress test might be helpful.

## 2015-11-03 NOTE — Progress Notes (Signed)
Pre visit review using our clinic review tool, if applicable. No additional management support is needed unless otherwise documented below in the visit note. 

## 2015-11-05 LAB — URINE CULTURE
COLONY COUNT: NO GROWTH
ORGANISM ID, BACTERIA: NO GROWTH

## 2015-11-10 ENCOUNTER — Telehealth: Payer: Self-pay

## 2015-11-10 NOTE — Telephone Encounter (Signed)
Reviewed lexi myoview instructions w/pt who verbalized understanding.  

## 2015-11-11 ENCOUNTER — Ambulatory Visit
Admission: RE | Admit: 2015-11-11 | Discharge: 2015-11-11 | Disposition: A | Discharge status: Home / Self Care | Payer: Medicare Other | Source: Ambulatory Visit | Attending: Cardiovascular Disease | Admitting: Cardiovascular Disease

## 2015-11-11 DIAGNOSIS — R42 Dizziness and giddiness: Secondary | ICD-10-CM

## 2015-11-11 DIAGNOSIS — R001 Bradycardia, unspecified: Secondary | ICD-10-CM

## 2015-11-11 DIAGNOSIS — I2510 Atherosclerotic heart disease of native coronary artery without angina pectoris: Secondary | ICD-10-CM | POA: Insufficient documentation

## 2015-11-11 LAB — NM MYOCAR MULTI W/SPECT W/WALL MOTION / EF
CHL CUP RESTING HR STRESS: 53 {beats}/min
CHL CUP STRESS STAGE 1 HR: 55 {beats}/min
CHL CUP STRESS STAGE 2 SPEED: 0 mph
CHL CUP STRESS STAGE 4 HR: 72 {beats}/min
CHL CUP STRESS STAGE 4 SPEED: 0 mph
CHL CUP STRESS STAGE 5 SBP: 130 mmHg
CHL CUP STRESS STAGE 5 SPEED: 0 mph
CHL CUP STRESS STAGE 6 DBP: 78 mmHg
CHL CUP STRESS STAGE 6 SBP: 160 mmHg
CSEPED: 0 min
CSEPPHR: 72 {beats}/min
Estimated workload: 1 METS
Exercise duration (sec): 0 s
LV dias vol: 73 mL
LVSYSVOL: 30 mL
MPHR: 141 {beats}/min
Percent HR: 57 %
Percent of predicted max HR: 51 %
SDS: 2
SRS: 1
SSS: 3
Stage 2 Grade: 0 %
Stage 2 HR: 51 {beats}/min
Stage 3 Grade: 0 %
Stage 3 HR: 51 {beats}/min
Stage 3 Speed: 0 mph
Stage 4 Grade: 0 %
Stage 5 DBP: 74 mmHg
Stage 5 Grade: 0 %
Stage 5 HR: 75 {beats}/min
Stage 6 Grade: 0 %
Stage 6 HR: 61 {beats}/min
Stage 6 Speed: 0 mph
TID: 1.13

## 2015-11-11 MED ORDER — TECHNETIUM TC 99M SESTAMIBI - CARDIOLITE
32.6800 | Freq: Once | INTRAVENOUS | Status: AC | PRN
  Administered 2015-11-11: 32.68 via INTRAVENOUS

## 2015-11-11 MED ORDER — TECHNETIUM TC 99M SESTAMIBI - CARDIOLITE
13.5570 | Freq: Once | INTRAVENOUS | Status: AC | PRN
  Administered 2015-11-11: 08:00:00 13.55 via INTRAVENOUS

## 2015-11-11 MED ORDER — REGADENOSON 0.4 MG/5ML IV SOLN
0.4000 mg | Freq: Once | INTRAVENOUS | Status: AC
  Administered 2015-11-11: 0.4 mg via INTRAVENOUS
  Filled 2015-11-11: qty 5

## 2015-11-14 ENCOUNTER — Other Ambulatory Visit: Payer: Self-pay | Admitting: Internal Medicine

## 2015-11-14 DIAGNOSIS — R42 Dizziness and giddiness: Secondary | ICD-10-CM

## 2015-11-15 ENCOUNTER — Other Ambulatory Visit: Payer: Self-pay | Admitting: Cardiovascular Disease

## 2015-11-18 ENCOUNTER — Ambulatory Visit (INDEPENDENT_AMBULATORY_CARE_PROVIDER_SITE_OTHER): Payer: Medicare Other | Admitting: Cardiovascular Disease

## 2015-11-18 ENCOUNTER — Encounter: Payer: Self-pay | Admitting: Cardiovascular Disease

## 2015-11-18 VITALS — BP 142/80 | HR 53 | Ht 60.0 in | Wt 174.0 lb

## 2015-11-18 DIAGNOSIS — R001 Bradycardia, unspecified: Secondary | ICD-10-CM | POA: Diagnosis not present

## 2015-11-18 DIAGNOSIS — I10 Essential (primary) hypertension: Secondary | ICD-10-CM

## 2015-11-18 DIAGNOSIS — R42 Dizziness and giddiness: Secondary | ICD-10-CM | POA: Diagnosis not present

## 2015-11-18 NOTE — Progress Notes (Signed)
Primary care physician: Dr. Victorino Dike walker  HPI  This is a 82 year old female who is here today for a follow-up visit regarding presyncope and bradycardia. She has known history of hypertension, hyperlipidemia, GERD, arthritis and essential tremors.  She was seen last year for presyncope in the setting of mild volume depletion and urinary tract infection.  She was noted to have sinus bradycardia with heart rate in the 50s.   Carotid Doppler showed no evidence of obstructive disease. Echocardiogram showed an ejection fraction of 50-55% with mild to moderate mitral regurgitation, mild tricuspid regurgitation and no significant aortic stenosis.  I decreased the dose of propranolol 20 mg twice daily. Holter monitor showed short runs of SVT with occasional sinus bradycardia. However, mean heart rate was in the 60s. Lowest heart rate was 47 at 3 AM. Her symptoms gradually improved but she was seen by Dr. Dan Humphreys recently and the patient reported more dizziness and some episodes of substernal chest tightness. Thus, I proceeded with a pharmacologic nuclear stress test which showed no evidence of ischemia with normal ejection fraction. She was found to have urinary tract infection . She finished treatment with Bactrim. Initially she felt better but now she is having recurrent dizziness described as lightheadedness which can happen while she is sitting or with walking. There has been no syncope.    Allergies  Allergen Reactions  . Amlodipine Other (See Comments)    malaise malaise  . Amoxicillin-Pot Clavulanate     Other reaction(s): Other (See Comments) Cannot remember.  . Penicillins   . Codeine Rash     Current Outpatient Prescriptions on File Prior to Visit  Medication Sig Dispense Refill  . Biotin 5000 MCG TABS Take by mouth daily.    . hydrochlorothiazide (HYDRODIURIL) 25 MG tablet TAKE 1 TABLET DAILY 90 tablet 2  . lisinopril (PRINIVIL,ZESTRIL) 40 MG tablet TAKE 1 TABLET DAILY 90 tablet  2  . Multiple Vitamins-Minerals (MULTIVITAMIN WITH MINERALS) tablet Take 1 tablet by mouth daily.    Marland Kitchen sulfamethoxazole-trimethoprim (BACTRIM) 400-80 MG tablet Take 1 tablet by mouth 2 (two) times daily. 14 tablet 0   No current facility-administered medications on file prior to visit.     Past Medical History  Diagnosis Date  . Hyperlipidemia   . Ulnar nerve lesion   . Vitamin D deficiency   . HTN (hypertension)   . Carpal tunnel syndrome   . GERD (gastroesophageal reflux disease)   . Benign essential tremor   . DJD (degenerative joint disease) of knee   . Abnormal stress test     w/fixed defect  . Abdominal pain, right upper quadrant   . UTI (urinary tract infection)   . Syncope and collapse      Past Surgical History  Procedure Laterality Date  . Vaginal delivery      1 set Twins/2 single births  . Breast cyst excision      x 2 - nml path per pt  . Umbilical hernia repair    . Total knee arthroplasty      bilateral, total, [facility]  . Total shoulder replacement  08/2010    left  . Carpal tunnel release  2004  . Breast biopsy  1957 and 1962    x 2, Nml per pt  . Tubal ligation    . Cataract extraction    . Bilateral bletharoplastices       Family History  Problem Relation Age of Onset  . Hypertension Mother   . Arthritis Mother   .  Stroke Mother   . Dementia Mother   . Early death Father 57    from brain cancer  . Brain cancer Father 40  . Breast cancer Maternal Aunt   . Alcohol abuse Other      Social History   Social History  . Marital Status: Widowed    Spouse Name: N/A  . Number of Children: 4  . Years of Education: N/A   Occupational History  . Retired - Biomedical scientist    Social History Main Topics  . Smoking status: Never Smoker   . Smokeless tobacco: Never Used  . Alcohol Use: No  . Drug Use: No  . Sexual Activity: Not Currently   Other Topics Concern  . Not on file   Social History Narrative   Lives alone in  [place]. Widow. Has children who live nearby.     PHYSICAL EXAM   BP 142/80 mmHg  Pulse 53  Ht 5' (1.524 m)  Wt 174 lb (78.926 kg)  BMI 33.98 kg/m2  Constitutional: She is oriented to person, place, and time. She appears well-developed and well-nourished. No distress.  HENT: No nasal discharge.  Head: Normocephalic and atraumatic.  Eyes: Pupils are equal and round. No discharge.  Neck: Normal range of motion. Neck supple. No JVD present. No thyromegaly present.  Cardiovascular: Normal rate, regular rhythm, normal heart sounds. Exam reveals no gallop and no friction rub. No murmur heard.  Pulmonary/Chest: Effort normal and breath sounds normal. No stridor. No respiratory distress. She has no wheezes. She has no rales. She exhibits no tenderness.  Abdominal: Soft. Bowel sounds are normal. She exhibits no distension. There is no tenderness. There is no rebound and no guarding.  Musculoskeletal: Normal range of motion. She exhibits no edema and no tenderness.  Neurological: She is alert and oriented to person, place, and time. Coordination normal.  Skin: Skin is warm and dry. No rash noted. She is not diaphoretic. No erythema. No pallor.  Psychiatric: She has a normal mood and affect. Her behavior is normal. Judgment and thought content normal.    EKG :  Sinus bradycardia. Otherwise normal.  ASSESSMENT AND PLAN

## 2015-11-18 NOTE — Assessment & Plan Note (Addendum)
This could be the cause of her recurrent dizziness. Thus, I think the first episode take her off propranolol which she has been on for essential tremor. I asked her to decrease the dose to 20 mg once daily for 3 days and then discontinue the medication. Recent stress test was normal.

## 2015-11-18 NOTE — Assessment & Plan Note (Signed)
I am making changes in her medications as outlined above.

## 2015-11-18 NOTE — Assessment & Plan Note (Signed)
I suspect that this is likely medication induced in the setting of treatment with Pravachol and hydrochlorothiazide. As outlined above, I'm going to wean her off propranolol. If she continues to have symptoms, then the next step would be to decrease hydrochlorothiazide. Amlodipine can be added if needed for blood pressure control.

## 2015-11-18 NOTE — Patient Instructions (Signed)
Medication Instructions:  Your physician has recommended you make the following change in your medication:  DECREASE propranolol to  once daily for 3 days then STOP.    Labwork: none  Testing/Procedures: none  Follow-Up: Your physician recommends that you schedule a follow-up appointment in: 3 months with Dr. Kirke Corin.    Any Other Special Instructions Will Be Listed Below (If Applicable).     If you need a refill on your cardiac medications before your next appointment, please call your pharmacy.

## 2015-11-20 ENCOUNTER — Ambulatory Visit: Payer: Medicare Other

## 2015-11-20 DIAGNOSIS — R42 Dizziness and giddiness: Secondary | ICD-10-CM

## 2016-02-03 ENCOUNTER — Ambulatory Visit (INDEPENDENT_AMBULATORY_CARE_PROVIDER_SITE_OTHER): Payer: Medicare Other | Admitting: Cardiovascular Disease

## 2016-02-03 ENCOUNTER — Encounter: Payer: Self-pay | Admitting: Cardiovascular Disease

## 2016-02-03 ENCOUNTER — Encounter (INDEPENDENT_AMBULATORY_CARE_PROVIDER_SITE_OTHER): Payer: Self-pay

## 2016-02-03 VITALS — BP 130/78 | HR 71 | Ht 60.0 in | Wt 175.8 lb

## 2016-02-03 DIAGNOSIS — I10 Essential (primary) hypertension: Secondary | ICD-10-CM | POA: Diagnosis not present

## 2016-02-03 DIAGNOSIS — R001 Bradycardia, unspecified: Secondary | ICD-10-CM | POA: Diagnosis not present

## 2016-02-03 NOTE — Progress Notes (Signed)
Cardiology Office Note   Date:  02/03/2016   ID:  Cheryl LoganRita Steele Dunlap, DOB 1934/06/07, MRN [id number]  PCP:  Wynona DoveWALKER,JENNIFER AZBELL, MD  Cardiologist:   Lorine BearsMuhammad Arida, MD   Chief Complaint  Patient presents with  . other    3 month f/u no complaints. Meds reviewed verbally with pt.      History of Present Illness: Cheryl Winters is a 82 y.o. female who presents for a follow-up visit regarding presyncope and bradycardia. She has known history of hypertension, hyperlipidemia, GERD, arthritis and essential tremors.  She was seen in 2016 for presyncope in the setting of mild volume depletion and urinary tract infection.  She was noted to have sinus bradycardia with heart rate in the 50s.   Carotid Doppler showed no evidence of obstructive disease. Echocardiogram showed an ejection fraction of 50-55% with mild to moderate mitral regurgitation, mild tricuspid regurgitation and no significant aortic stenosis.  I decreased the dose of propranolol 20 mg twice daily. Holter monitor showed short runs of SVT with occasional sinus bradycardia. However, mean heart rate was in the 60s. Lowest heart rate was 47 at 3 AM. She had recurrent dizziness early this year with episodes of  substernal chest tightness. Thus, I proceeded with a pharmacologic nuclear stress test which showed no evidence of ischemia with normal ejection fraction.  Bradycardia persisted and thus I elected to discontinue propranolol altogether. Since then, she reports significant improvement in symptoms with almost complete resolution of dizziness and no further episodes of chest pain. Blood pressure has been controlled.   Past Medical History  Diagnosis Date  . Hyperlipidemia   . Ulnar nerve lesion   . Vitamin D deficiency   . HTN (hypertension)   . Carpal tunnel syndrome   . GERD (gastroesophageal reflux disease)   . Benign essential tremor   . DJD (degenerative joint disease) of knee   . Abnormal stress test     w/fixed  defect  . Abdominal pain, right upper quadrant   . UTI (urinary tract infection)   . Syncope and collapse     Past Surgical History  Procedure Laterality Date  . Vaginal delivery      1 set Twins/2 single births  . Breast cyst excision      x 2 - nml path per pt  . Umbilical hernia repair    . [place]      [place]  . Total shoulder replacement  08/2010    left  . Carpal tunnel release  2004  . Breast biopsy  1957 and 1962    x 2, Nml per pt  . Tubal ligation    . Cataract extraction    . Bilateral bletharoplastices       Current Outpatient Prescriptions  Medication Sig Dispense Refill  . Biotin 5000 MCG TABS Take by mouth daily.    . hydrochlorothiazide (HYDRODIURIL) 25 MG tablet TAKE 1 TABLET DAILY 90 tablet 2  . lisinopril (PRINIVIL,ZESTRIL) 40 MG tablet TAKE 1 TABLET DAILY 90 tablet 2  . Multiple Vitamins-Minerals (MULTIVITAMIN WITH MINERALS) tablet Take 1 tablet by mouth daily.     No current facility-administered medications for this visit.    Allergies:   Amlodipine; Amoxicillin-pot clavulanate; Penicillins; and Codeine    Social History:  The patient  reports that she has never smoked. She has never used smokeless tobacco. She reports that she does not drink alcohol or use illicit drugs.   Family History:  The patient's family history includes Alcohol abuse in her other; Arthritis in her mother; Brain cancer (age of onset: 56) in her father; Breast cancer in her maternal aunt; Dementia in her mother; Early death (age of onset: 89) in her father; Hypertension in her mother; Stroke in her mother.    ROS:  Please see the history of present illness.   Otherwise, review of systems are positive for none.   All other systems are reviewed and negative.    PHYSICAL EXAM: VS:  BP 130/78 mmHg  Pulse 71  Ht 5' (1.524 m)  Wt 175 lb 12 oz (79.72 kg)  BMI 34.32 kg/m2 , BMI Body mass index is 34.32 kg/(m^2). GEN: Well nourished, well  developed, in no acute distress HEENT: normal Neck: no JVD, carotid bruits, or masses Cardiac: RRR; no murmurs, rubs, or gallops,no edema  Respiratory:  clear to auscultation bilaterally, normal work of breathing GI: soft, nontender, nondistended, + BS MS: no deformity or atrophy Skin: warm and dry, no rash Neuro:  Strength and sensation are intact Psych: euthymic mood, full affect   EKG:  EKG is not ordered today.   Recent Labs: 10/31/2015: ALT 11; BUN 17; Creatinine, Ser 0.71; Hemoglobin 13.3; Platelets 194.0; Potassium 4.0; Sodium 142; TSH 2.12    Lipid Panel    Component Value Date/Time   CHOL 192 10/30/2014 1147   TRIG 63.0 10/30/2014 1147   HDL 50.50 10/30/2014 1147   CHOLHDL 4 10/30/2014 1147   VLDL 12.6 10/30/2014 1147   LDLCALC 129* 10/30/2014 1147   LDLDIRECT 123.9 08/27/2013 0814      Wt Readings from Last 3 Encounters:  02/03/16 175 lb 12 oz (79.72 kg)  11/18/15 174 lb (78.926 kg)  11/03/15 175 lb 6 oz (79.55 kg)        ASSESSMENT AND PLAN:  1.  Symptomatic bradycardia: This has improved significantly after stopping propranolol. Continue to monitor.  2. Essential hypertension: Blood pressure is well controlled on current medications.  3. Chest pain: Resolved completely. Negative nuclear stress test recently.    Disposition:   FU with me in 6 months  Signed,  Lorine Bears, MD  02/03/2016 2:12 PM     [facility]

## 2016-02-03 NOTE — Patient Instructions (Signed)
Medication Instructions: Continue same medications.   Labwork: None.   Procedures/Testing: None.   Follow-Up: 6 months with Dr. Arida.   Any Additional Special Instructions Will Be Listed Below (If Applicable).     If you need a refill on your cardiac medications before your next appointment, please call your pharmacy.   

## 2016-02-20 ENCOUNTER — Ambulatory Visit: Payer: Medicare Other | Admitting: Cardiovascular Disease

## 2016-06-25 ENCOUNTER — Encounter: Payer: Self-pay | Admitting: Internal Medicine

## 2016-06-25 ENCOUNTER — Ambulatory Visit (INDEPENDENT_AMBULATORY_CARE_PROVIDER_SITE_OTHER): Payer: Medicare Other | Admitting: Internal Medicine

## 2016-06-25 VITALS — BP 150/98 | HR 74 | Temp 98.3°F | Ht 60.0 in | Wt 178.0 lb

## 2016-06-25 DIAGNOSIS — G250 Essential tremor: Secondary | ICD-10-CM | POA: Diagnosis not present

## 2016-06-25 DIAGNOSIS — Z23 Encounter for immunization: Secondary | ICD-10-CM | POA: Diagnosis not present

## 2016-06-25 DIAGNOSIS — I10 Essential (primary) hypertension: Secondary | ICD-10-CM

## 2016-06-25 DIAGNOSIS — R413 Other amnesia: Secondary | ICD-10-CM | POA: Diagnosis not present

## 2016-06-25 DIAGNOSIS — R42 Dizziness and giddiness: Secondary | ICD-10-CM

## 2016-06-25 DIAGNOSIS — E785 Hyperlipidemia, unspecified: Secondary | ICD-10-CM

## 2016-06-25 NOTE — Progress Notes (Signed)
Pre visit review using our clinic review tool, if applicable. No additional management support is needed unless otherwise documented below in the visit note. 

## 2016-06-25 NOTE — Progress Notes (Signed)
Patient ID: Cheryl Winters, female   DOB: 1934/07/12, 82 y.o.   MRN: [id number]   Subjective:    Patient ID: Cheryl Winters, female    DOB: 1933-12-24, 82 y.o.   MRN: [id number]  HPI  Patient here for a scheduled follow up.  She is here to establish care with me.  Her daughter is accompanying her today.  History obtained from both of them.  Former pt of Dr Dan Humphreys.  In reviewing chart, she has had issues with dizziness.  Sees Dr Kirke Corin.  ECHO - 50-55% with mild to moderate mitral regurgitation, mild tricuspid regurgitation and no significant AS.  Nuclear stress test negative.  Holter - mean HR - 60s.  Beta blocker was stopped.  She felt better, but still has some issues with dizziness.  They report her dizziness is worse when she is stressed.  Worries a lot.  Lives by herself.  Able to get around and do things she needs to do.  Some concerns regarding her memory.  Repeats things.     Past Medical History:  Diagnosis Date  . Abdominal pain, right upper quadrant   . Abnormal stress test    w/fixed defect  . Benign essential tremor   . Carpal tunnel syndrome   . DJD (degenerative joint disease) of knee   . GERD (gastroesophageal reflux disease)   . HTN (hypertension)   . Hyperlipidemia   . Syncope and collapse   . Ulnar nerve lesion   . UTI (urinary tract infection)   . Vitamin D deficiency    Past Surgical History:  Procedure Laterality Date  . Bilateral Bletharoplastices    . BREAST BIOPSY  1957 and 1962   x 2, Nml per pt  . BREAST CYST EXCISION     x 2 - nml path per pt  . CARPAL TUNNEL RELEASE  2004  . CATARACT EXTRACTION    . TOTAL KNEE ARTHROPLASTY     bilateral, total, [facility]  . TOTAL SHOULDER REPLACEMENT  08/2010   left  . TUBAL LIGATION    . UMBILICAL HERNIA REPAIR    . VAGINAL DELIVERY     1 set Twins/2 single births   Family History  Problem Relation Age of Onset  . Hypertension Mother   . Arthritis Mother   . Stroke Mother   . Dementia Mother   .  Early death Father 75    from brain cancer  . Brain cancer Father 73  . Breast cancer Maternal Aunt   . Alcohol abuse Other    Social History   Social History  . Marital status: Widowed    Spouse name: N/A  . Number of children: 4  . Years of education: N/A   Occupational History  . Retired - Biomedical scientist    Social History Main Topics  . Smoking status: Never Smoker  . Smokeless tobacco: Never Used  . Alcohol use No  . Drug use: No  . Sexual activity: Not Currently   Other Topics Concern  . None   Social History Narrative   Lives alone in [place]. Widow. Has children who live nearby.    Outpatient Encounter Prescriptions as of 06/25/2016  Medication Sig  . Biotin 5000 MCG TABS Take by mouth daily.  . hydrochlorothiazide (HYDRODIURIL) 25 MG tablet TAKE 1 TABLET DAILY  . lisinopril (PRINIVIL,ZESTRIL) 40 MG tablet TAKE 1 TABLET DAILY  . Multiple Vitamins-Minerals (MULTIVITAMIN WITH MINERALS) tablet Take 1 tablet by mouth daily.   No facility-administered  encounter medications on file as of 06/25/2016.     Review of Systems  Constitutional: Negative for fatigue and unexpected weight change.  HENT: Negative for congestion and sinus pressure.   Respiratory: Negative for cough, chest tightness and shortness of breath.   Cardiovascular: Negative for chest pain, palpitations and leg swelling.  Gastrointestinal: Negative for abdominal pain, diarrhea, nausea and vomiting.  Genitourinary: Negative for difficulty urinating and dysuria.  Musculoskeletal: Negative for back pain and joint swelling.  Skin: Negative for color change and rash.  Neurological: Positive for dizziness. Negative for headaches.  Psychiatric/Behavioral: Negative for dysphoric mood.       Increased stress as outlined.         Objective:     Blood pressure rechecked by me:  146/82  Physical Exam  Constitutional: She appears well-developed and well-nourished. No distress.  HENT:  Nose: Nose  normal.  Mouth/Throat: Oropharynx is clear and moist.  Neck: Neck supple. No thyromegaly present.  Cardiovascular: Normal rate and regular rhythm.   Pulmonary/Chest: Breath sounds normal. No respiratory distress. She has no wheezes.  Abdominal: Soft. Bowel sounds are normal. There is no tenderness.  Musculoskeletal: She exhibits no edema or tenderness.  Lymphadenopathy:    She has no cervical adenopathy.  Skin: No rash noted. No erythema.  Psychiatric: She has a normal mood and affect. Her behavior is normal.    BP (!) 150/98   Pulse 74   Temp 98.3 F (36.8 C) (Oral)   Ht 5' (1.524 m)   Wt 178 lb (80.7 kg)   SpO2 95%   BMI 34.76 kg/m  Wt Readings from Last 3 Encounters:  06/25/16 178 lb (80.7 kg)  02/03/16 175 lb 12 oz (79.7 kg)  11/18/15 174 lb (78.9 kg)     Lab Results  Component Value Date   WBC 4.6 10/31/2015   HGB 13.3 10/31/2015   HCT 39.6 10/31/2015   PLT 194.0 10/31/2015   GLUCOSE 93 10/31/2015   CHOL 192 10/30/2014   TRIG 63.0 10/30/2014   HDL 50.50 10/30/2014   LDLDIRECT 123.9 08/27/2013   LDLCALC 129 (H) 10/30/2014   ALT 11 10/31/2015   AST 18 10/31/2015   NA 142 10/31/2015   K 4.0 10/31/2015   CL 103 10/31/2015   CREATININE 0.71 10/31/2015   BUN 17 10/31/2015   CO2 33 (H) 10/31/2015   TSH 2.12 10/31/2015   INR 1.0 10/07/2014   MICROALBUR 0.3 09/02/2014    Nm Myocar Multi W/spect W/wall Motion / Ef  Result Date: 11/11/2015 Pharmacological myocardial perfusion imaging study with no significant ischemia Normal wall motion, EF estimated at 62% No EKG changes concerning for ischemia. Low risk scan Signed, Dossie Arbour, MD, Ph.D [facility]       Assessment & Plan:   Problem List Items Addressed This Visit    Hyperlipidemia    Low cholesterol diet and exercise.  Follow lipid panel.        Hypertension    Blood pressure as outlined.  Continue same medications.  Follow pressures.  Follow metabolic panel.        Relevant Orders   Lipid panel    Lightheadedness    Persistent intermittent light headedness/dizziness.  Is better off propranolol. Saw cardiology.  Had w/up.  Unrevealing.  Blood pressure as outlined.  Hold on making changes in medications.  Schedule appt with neurology for evaluation.        Relevant Orders   Ambulatory referral to Neurology   CBC with Differential/Platelet  Comprehensive metabolic panel   Memory change - Primary    Discussed today.  Feels worsens with stress.  Check tsh and b12.  Refer to neurology as outlined.       Relevant Orders   TSH   Vitamin B12   Tremor, essential    Has history of benign head tremor.  Unable to tolerate neurontin and primodone.  Has seen neurology.        Relevant Orders   TSH    Other Visit Diagnoses    Encounter for immunization       Relevant Orders   Flu vaccine HIGH DOSE PF (Completed)   Ambulatory referral to Neurology     I spent 25 minutes with the patient and more than 50% of the time was spent in consultation regarding the above.     Dale DurhamSCOTT, Brighid Koch, MD

## 2016-06-27 ENCOUNTER — Encounter: Payer: Self-pay | Admitting: Internal Medicine

## 2016-06-27 DIAGNOSIS — R413 Other amnesia: Secondary | ICD-10-CM | POA: Insufficient documentation

## 2016-06-27 NOTE — Assessment & Plan Note (Signed)
Has history of benign head tremor.  Unable to tolerate neurontin and primodone.  Has seen neurology.

## 2016-06-27 NOTE — Assessment & Plan Note (Signed)
Low cholesterol diet and exercise.  Follow lipid panel.   

## 2016-06-27 NOTE — Assessment & Plan Note (Signed)
Persistent intermittent light headedness/dizziness.  Is better off propranolol. Saw cardiology.  Had w/up.  Unrevealing.  Blood pressure as outlined.  Hold on making changes in medications.  Schedule appt with neurology for evaluation.

## 2016-06-27 NOTE — Assessment & Plan Note (Signed)
Blood pressure as outlined.  Continue same medications.  Follow pressures.  Follow metabolic panel.   

## 2016-06-27 NOTE — Assessment & Plan Note (Signed)
Discussed today.  Feels worsens with stress.  Check tsh and b12.  Refer to neurology as outlined.

## 2016-06-28 ENCOUNTER — Other Ambulatory Visit: Payer: Self-pay

## 2016-06-28 MED ORDER — HYDROCHLOROTHIAZIDE 25 MG PO TABS: 25.0000 mg | ORAL_TABLET | Freq: Every day | ORAL | 2 refills | Status: DC

## 2016-06-28 MED ORDER — LISINOPRIL 40 MG PO TABS
40.0000 mg | ORAL_TABLET | Freq: Every day | ORAL | 2 refills | Status: DC
Start: 2016-06-28 — End: 2017-04-14

## 2016-07-02 ENCOUNTER — Other Ambulatory Visit (INDEPENDENT_AMBULATORY_CARE_PROVIDER_SITE_OTHER): Payer: Medicare Other

## 2016-07-02 DIAGNOSIS — I10 Essential (primary) hypertension: Secondary | ICD-10-CM

## 2016-07-02 DIAGNOSIS — G250 Essential tremor: Secondary | ICD-10-CM

## 2016-07-02 DIAGNOSIS — R413 Other amnesia: Secondary | ICD-10-CM

## 2016-07-02 DIAGNOSIS — R42 Dizziness and giddiness: Secondary | ICD-10-CM

## 2016-07-02 LAB — COMPREHENSIVE METABOLIC PANEL
ALBUMIN: 3.7 g/dL (ref 3.5–5.2)
ALT: 13 U/L (ref 0–35)
AST: 17 U/L (ref 0–37)
Alkaline Phosphatase: 57 U/L (ref 39–117)
BUN: 21 mg/dL (ref 6–23)
CALCIUM: 9.1 mg/dL (ref 8.4–10.5)
CHLORIDE: 106 meq/L (ref 96–112)
CO2: 31 mEq/L (ref 19–32)
Creatinine, Ser: 0.7 mg/dL (ref 0.40–1.20)
GFR: 85.47 mL/min (ref >60.00)
Glucose, Bld: 80 mg/dL (ref 70–99)
POTASSIUM: 4.1 meq/L (ref 3.5–5.1)
SODIUM: 142 meq/L (ref 135–145)
TOTAL PROTEIN: 6.7 g/dL (ref 6.0–8.3)
Total Bilirubin: 0.7 mg/dL (ref 0.2–1.2)

## 2016-07-02 LAB — CBC WITH DIFFERENTIAL/PLATELET
Basophils Absolute: 0 10*3/uL (ref 0.0–0.1)
Basophils Relative: 0.7 % (ref 0.0–3.0)
EOS PCT: 3.8 % (ref 0.0–5.0)
Eosinophils Absolute: 0.2 10*3/uL (ref 0.0–0.7)
HCT: 38.2 % (ref 36.0–46.0)
HEMOGLOBIN: 13.1 g/dL (ref 12.0–15.0)
Lymphocytes Relative: 33.9 % (ref 12.0–46.0)
Lymphs Abs: 1.4 10*3/uL (ref 0.7–4.0)
MCHC: 34.4 g/dL (ref 30.0–36.0)
MCV: 89.5 fl (ref 78.0–100.0)
MONO ABS: 0.3 10*3/uL (ref 0.1–1.0)
Monocytes Relative: 7 % (ref 3.0–12.0)
Neutro Abs: 2.2 10*3/uL (ref 1.4–7.7)
Neutrophils Relative %: 54.6 % (ref 43.0–77.0)
Platelets: 201 10*3/uL (ref 150.0–400.0)
RBC: 4.26 Mil/uL (ref 3.87–5.11)
RDW: 13.4 % (ref 11.5–15.5)
WBC: 4.1 10*3/uL (ref 4.0–10.5)

## 2016-07-02 LAB — LIPID PANEL
CHOLESTEROL: 223 mg/dL — AB (ref 0–200)
HDL: 66.3 mg/dL (ref >39.00)
LDL Cholesterol: 148 mg/dL — ABNORMAL HIGH (ref 0–99)
NonHDL: 157.11
Total CHOL/HDL Ratio: 3
Triglycerides: 47 mg/dL (ref 0.0–149.0)
VLDL: 9.4 mg/dL (ref 0.0–40.0)

## 2016-07-02 LAB — TSH: TSH: 2.78 u[IU]/mL (ref 0.35–4.50)

## 2016-07-02 LAB — VITAMIN B12: VITAMIN B 12: 458 pg/mL (ref 211–911)

## 2016-07-05 ENCOUNTER — Other Ambulatory Visit: Payer: Self-pay | Admitting: Internal Medicine

## 2016-07-05 DIAGNOSIS — R42 Dizziness and giddiness: Secondary | ICD-10-CM

## 2016-07-06 ENCOUNTER — Other Ambulatory Visit (INDEPENDENT_AMBULATORY_CARE_PROVIDER_SITE_OTHER): Payer: Medicare Other

## 2016-07-06 DIAGNOSIS — R42 Dizziness and giddiness: Secondary | ICD-10-CM

## 2016-07-06 LAB — URINALYSIS, ROUTINE W REFLEX MICROSCOPIC
BILIRUBIN URINE: NEGATIVE
KETONES UR: NEGATIVE
NITRITE: NEGATIVE
PH: 7 (ref 5.0–8.0)
Specific Gravity, Urine: 1.01 (ref 1.000–1.030)
TOTAL PROTEIN, URINE-UPE24: NEGATIVE
URINE GLUCOSE: NEGATIVE
UROBILINOGEN UA: 0.2 (ref 0.0–1.0)

## 2016-07-09 LAB — CULTURE, URINE COMPREHENSIVE

## 2016-08-05 ENCOUNTER — Ambulatory Visit: Payer: Medicare Other | Admitting: Cardiovascular Disease

## 2016-08-06 ENCOUNTER — Ambulatory Visit: Payer: Medicare Other | Admitting: Cardiovascular Disease

## 2016-08-31 ENCOUNTER — Encounter: Payer: Self-pay | Admitting: Cardiovascular Disease

## 2016-08-31 ENCOUNTER — Ambulatory Visit (INDEPENDENT_AMBULATORY_CARE_PROVIDER_SITE_OTHER): Payer: Medicare Other | Admitting: Cardiovascular Disease

## 2016-08-31 VITALS — BP 120/78 | HR 64 | Ht 60.0 in | Wt 179.0 lb

## 2016-08-31 DIAGNOSIS — R001 Bradycardia, unspecified: Secondary | ICD-10-CM

## 2016-08-31 DIAGNOSIS — I10 Essential (primary) hypertension: Secondary | ICD-10-CM

## 2016-08-31 DIAGNOSIS — E785 Hyperlipidemia, unspecified: Secondary | ICD-10-CM | POA: Diagnosis not present

## 2016-08-31 NOTE — Patient Instructions (Signed)
Medication Instructions: Continue same medications.   Labwork: None.   Procedures/Testing: None.   Follow-Up: As needed with Dr. Arida.   Any Additional Special Instructions Will Be Listed Below (If Applicable).     If you need a refill on your cardiac medications before your next appointment, please call your pharmacy.   

## 2016-08-31 NOTE — Progress Notes (Signed)
Cardiology Office Note   Date:  08/31/2016   ID:  Cheryl Winters, DOB 05/04/1934, MRN [id number]  PCP:  Dale DurhamSCOTT, CHARLENE, MD  Cardiologist:   Lorine BearsMuhammad Shametra Cumberland, MD   Chief Complaint  Patient presents with  . other    6 month f/u no complaints today. Meds reviewed verbally with pt.      History of Present Illness: Cheryl LoganRita Steele Winters is a 82 y.o. female who presents for a follow-up visit regarding presyncope and bradycardia. She has known history of hypertension, hyperlipidemia, GERD, arthritis and essential tremors.  She had sinus bradycardia which might have contributed to dizziness in the setting of treatment with propranolol for essential tremors. Bradycardia resolved after stopping propranolol.   Previous Carotid Doppler showed no evidence of obstructive disease. Echocardiogram showed an ejection fraction of 50-55% with mild to moderate mitral regurgitation, mild tricuspid regurgitation and no significant aortic stenosis.  She had atypical chest pain early 2017 and was evaluated with a pharmacologic nuclear stress test in January. The stress test showed no evidence of ischemia with normal ejection fraction.   she has been doing reasonably well and denies any recurrent chest pain or shortness of breath. She complains of worsening memory.  Past Medical History:  Diagnosis Date  . Abdominal pain, right upper quadrant   . Abnormal stress test    w/fixed defect  . Benign essential tremor   . Carpal tunnel syndrome   . DJD (degenerative joint disease) of knee   . GERD (gastroesophageal reflux disease)   . HTN (hypertension)   . Hyperlipidemia   . Syncope and collapse   . Ulnar nerve lesion   . UTI (urinary tract infection)   . Vitamin D deficiency     Past Surgical History:  Procedure Laterality Date  . Bilateral Bletharoplastices    . BREAST BIOPSY  1957 and 1962   x 2, Nml per pt  . BREAST CYST EXCISION     x 2 - nml path per pt  . CARPAL TUNNEL RELEASE  2004  .  CATARACT EXTRACTION    . [place]OTAL [place]     [place]  . TOTAL SHOULDER REPLACEMENT  08/2010   left  . TUBAL LIGATION    . UMBILICAL HERNIA REPAIR    . VAGINAL DELIVERY     1 set Twins/2 single births     Current Outpatient Prescriptions  Medication Sig Dispense Refill  . Biotin 5000 MCG TABS Take by mouth daily.    . hydrochlorothiazide (HYDRODIURIL) 25 MG tablet Take 1 tablet (25 mg total) by mouth daily. 90 tablet 2  . lisinopril (PRINIVIL,ZESTRIL) 40 MG tablet Take 1 tablet (40 mg total) by mouth daily. 90 tablet 2  . Multiple Vitamins-Minerals (MULTIVITAMIN WITH MINERALS) tablet Take 1 tablet by mouth daily.     No current facility-administered medications for this visit.     Allergies:   Amlodipine; Amoxicillin-pot clavulanate; Penicillins; and Codeine    Social History:  The patient  reports that she has never smoked. She has never used smokeless tobacco. She reports that she does not drink alcohol or use drugs.   Family History:  The patient's family history includes Alcohol abuse in her other; Arthritis in her mother; Brain cancer (age of onset: 1642) in her father; Breast cancer in her maternal aunt; Dementia in her mother; Early death (age of onset: 5142) in her father; Hypertension in her mother; Stroke in her mother.    ROS:  Please  see the history of present illness.   Otherwise, review of systems are positive for none.   All other systems are reviewed and negative.    PHYSICAL EXAM: VS:  BP 120/78 (BP Location: Left Arm, Patient Position: Sitting, Cuff Size: Normal)   Pulse 64   Ht 5' (1.524 m)   Wt 179 lb (81.2 kg)   BMI 34.96 kg/m  , BMI Body mass index is 34.96 kg/m. GEN: Well nourished, well developed, in no acute distress  HEENT: normal  Neck: no JVD, carotid bruits, or masses Cardiac: RRR; no murmurs, rubs, or gallops,no edema  Respiratory:  clear to auscultation bilaterally, normal work of breathing GI: soft, nontender,  nondistended, + BS MS: no deformity or atrophy  Skin: warm and dry, no rash Neuro:  Strength and sensation are intact Psych: euthymic mood, full affect   EKG:  EKG is ordered today. EKG showed normal sinus rhythm with low voltage.  Recent Labs: 07/02/2016: ALT 13; BUN 21; Creatinine, Ser 0.70; Hemoglobin 13.1; Platelets 201.0; Potassium 4.1; Sodium 142; TSH 2.78    Lipid Panel    Component Value Date/Time   CHOL 223 (H) 07/02/2016 0804   TRIG 47.0 07/02/2016 0804   HDL 66.30 07/02/2016 0804   CHOLHDL 3 07/02/2016 0804   VLDL 9.4 07/02/2016 0804   LDLCALC 148 (H) 07/02/2016 0804   LDLDIRECT 123.9 08/27/2013 0814      Wt Readings from Last 3 Encounters:  08/31/16 179 lb (81.2 kg)  06/25/16 178 lb (80.7 kg)  02/03/16 175 lb 12 oz (79.7 kg)        ASSESSMENT AND PLAN:  1.  Symptomatic bradycardia:This has resolved completely after stopping propranolol.   2. Essential hypertension: Blood pressure is well controlled on current medications.  3. Chest pain: Resolved completely. Negative nuclear stress testin January 2017.   Disposition:   FU with me as needed  Signed,  Lorine BearsMuhammad Franciszek Platten, MD  08/31/2016 3:07 PM    [facility]

## 2016-10-07 ENCOUNTER — Encounter: Payer: Self-pay | Admitting: Internal Medicine

## 2016-10-07 ENCOUNTER — Telehealth: Payer: Self-pay | Admitting: *Deleted

## 2016-10-07 ENCOUNTER — Ambulatory Visit (INDEPENDENT_AMBULATORY_CARE_PROVIDER_SITE_OTHER): Payer: Medicare Other | Admitting: Internal Medicine

## 2016-10-07 DIAGNOSIS — E785 Hyperlipidemia, unspecified: Secondary | ICD-10-CM

## 2016-10-07 DIAGNOSIS — R001 Bradycardia, unspecified: Secondary | ICD-10-CM | POA: Diagnosis not present

## 2016-10-07 DIAGNOSIS — I10 Essential (primary) hypertension: Secondary | ICD-10-CM | POA: Diagnosis not present

## 2016-10-07 DIAGNOSIS — R413 Other amnesia: Secondary | ICD-10-CM | POA: Diagnosis not present

## 2016-10-07 DIAGNOSIS — R42 Dizziness and giddiness: Secondary | ICD-10-CM

## 2016-10-07 NOTE — Telephone Encounter (Signed)
FYI

## 2016-10-07 NOTE — Progress Notes (Signed)
Patient ID: Cheryl Winters, female   DOB: 28-Jan-1934, 82 y.o.   MRN: [id number]   Subjective:    Patient ID: Cheryl Winters, female    DOB: [birth year], 82 y.o.   MRN: [id number]  HPI  Patient here for a scheduled follow up.  Sees cardiology for presyncope and bradycardia.  Bradycardia has resolved.  Blood pressure has been controlled.  Saw Dr Kirke Corin 08/31/16.  Felt to be stable.  Because of her persistent intermittent episodes of dizziness, she saw Dr Malvin Johns.  Having some trouble with short term memory issues as well.  Note reviewed.  He recommended pt to stay well hydrated.  Some concerns over orthostatic hypotension.  Recommended mri and carotid ultrasound.  She has not had this testing yet.  Discussed with neurology today to confirm scheduling.  Has f/u planned with dizziness.  No headache.  Just light headedness persist as outlined.  No chest pain.  No sob.  No acid reflux.  No abdominal pain or cramping.  Bowels stable.     Past Medical History:  Diagnosis Date  . Abdominal pain, right upper quadrant   . Abnormal stress test    w/fixed defect  . Benign essential tremor   . Carpal tunnel syndrome   . DJD (degenerative joint disease) of knee   . GERD (gastroesophageal reflux disease)   . HTN (hypertension)   . Hyperlipidemia   . Syncope and collapse   . Ulnar nerve lesion   . UTI (urinary tract infection)   . Vitamin D deficiency    Past Surgical History:  Procedure Laterality Date  . Bilateral Bletharoplastices    . BREAST BIOPSY  1957 and 1962   x 2, Nml per pt  . BREAST CYST EXCISION     x 2 - nml path per pt  . CARPAL TUNNEL RELEASE  2004  . CATARACT EXTRACTION    . TOTAL KNEE ARTHROPLASTY     bilateral, total, Marcy Panning  . TOTAL SHOULDER REPLACEMENT  08/2010   left  . TUBAL LIGATION    . UMBILICAL HERNIA REPAIR    . VAGINAL DELIVERY     1 set Twins/2 single births   Family History  Problem Relation Age of Onset  . Hypertension Mother   . Arthritis Mother     . Stroke Mother   . Dementia Mother   . Early death Father 26    from brain cancer  . Brain cancer Father 11  . Breast cancer Maternal Aunt   . Alcohol abuse Other    Social History   Social History  . Marital status: Widowed    Spouse name: N/A  . Number of children: 4  . Years of education: N/A   Occupational History  . Retired - Biomedical scientist    Social History Main Topics  . Smoking status: Never Smoker  . Smokeless tobacco: Never Used  . Alcohol use No  . Drug use: No  . Sexual activity: Not Currently   Other Topics Concern  . None   Social History Narrative   Lives alone in [place]. Widow. Has children who live nearby.    Outpatient Encounter Prescriptions as of 10/07/2016  Medication Sig  . Biotin 5000 MCG TABS Take by mouth daily.  . hydrochlorothiazide (HYDRODIURIL) 25 MG tablet Take 1 tablet (25 mg total) by mouth daily.  Marland Kitchen lisinopril (PRINIVIL,ZESTRIL) 40 MG tablet Take 1 tablet (40 mg total) by mouth daily.  . Multiple Vitamins-Minerals (MULTIVITAMIN WITH MINERALS) tablet Take  1 tablet by mouth daily.   No facility-administered encounter medications on file as of 10/07/2016.     Review of Systems  Constitutional: Negative for appetite change and unexpected weight change.  HENT: Negative for congestion and sinus pressure.   Respiratory: Negative for cough, chest tightness and shortness of breath.   Cardiovascular: Negative for chest pain, palpitations and leg swelling.  Gastrointestinal: Negative for abdominal pain, diarrhea, nausea and vomiting.  Genitourinary: Negative for difficulty urinating and dysuria.  Musculoskeletal: Negative for back pain and joint swelling.  Skin: Negative for color change and rash.  Neurological: Positive for dizziness and light-headedness. Negative for headaches.       Being worked up by neurology for memory issues.    Psychiatric/Behavioral:       States she worries a lot.  Discussed with her today.  Discussed  possible treatment options.         Objective:     Blood pressure rechecked by me:  142/84  Physical Exam  Constitutional: She appears well-developed and well-nourished. No distress.  HENT:  Nose: Nose normal.  Mouth/Throat: Oropharynx is clear and moist.  Neck: Neck supple. No thyromegaly present.  Cardiovascular: Normal rate and regular rhythm.   Pulmonary/Chest: Breath sounds normal. No respiratory distress. She has no wheezes.  Abdominal: Soft. Bowel sounds are normal. There is no tenderness.  Musculoskeletal: She exhibits no edema or tenderness.  Lymphadenopathy:    She has no cervical adenopathy.  Skin: No rash noted. No erythema.  Psychiatric: She has a normal mood and affect. Her behavior is normal.    BP (!) 158/92   Pulse 67   Temp 98.2 F (36.8 C) (Oral)   Ht 5' (1.524 m)   Wt 177 lb 9.6 oz (80.6 kg)   SpO2 96%   BMI 34.69 kg/m  Wt Readings from Last 3 Encounters:  10/07/16 177 lb 9.6 oz (80.6 kg)  08/31/16 179 lb (81.2 kg)  06/25/16 178 lb (80.7 kg)     Lab Results  Component Value Date   WBC 4.1 07/02/2016   HGB 13.1 07/02/2016   HCT 38.2 07/02/2016   PLT 201.0 07/02/2016   GLUCOSE 80 07/02/2016   CHOL 223 (H) 07/02/2016   TRIG 47.0 07/02/2016   HDL 66.30 07/02/2016   LDLDIRECT 123.9 08/27/2013   LDLCALC 148 (H) 07/02/2016   ALT 13 07/02/2016   AST 17 07/02/2016   NA 142 07/02/2016   K 4.1 07/02/2016   CL 106 07/02/2016   CREATININE 0.70 07/02/2016   BUN 21 07/02/2016   CO2 31 07/02/2016   TSH 2.78 07/02/2016   INR 1.0 10/07/2014   MICROALBUR 0.3 09/02/2014    Nm Myocar Multi W/spect W/wall Motion / Ef  Result Date: 11/11/2015 Pharmacological myocardial perfusion imaging study with no significant ischemia Normal wall motion, EF estimated at 62% No EKG changes concerning for ischemia. Low risk scan Signed, Dossie Arbourim Gollan, MD, Ph.D [facility]       Assessment & Plan:   Problem List Items Addressed This Visit    Bradycardia     Bradycardia has now resolved.  Followed by cardiology.       Hyperlipidemia    Low cholesterol diet and exercise.  Follow lipid panel.        Relevant Orders   Lipid panel   Hepatic function panel   Hypertension    Blood pressure elevated.  Recheck improved.  Will continue same medication regimen.  Follow pressures.  Follow metabolic panel.  Relevant Orders   Basic metabolic panel   Lightheadedness    Bradycardia resolved.  Seeing cardiology.  Persistent light headedness as outlined.  Saw neurology.  Concern over orthostatic hypotension.  Encouraged staying hydrated.  Planning for w/up - carotid ultrasound/mri.  Keep f/u with neurology.        Memory change    Seeing neurology.  Undergoing w/up.  Follow.            Dale DurhamSCOTT, Korrie Hofbauer, MD

## 2016-10-07 NOTE — Telephone Encounter (Signed)
FYI for Dr. Lorin PicketScott ; Toni Amendourtney from Dr [facility]ter [facility], Order corroded artery  ultrasound and brain MRI -results are not in  Pt was diagnosed with Orthostatic hypotension follow in dizziness

## 2016-10-07 NOTE — Progress Notes (Signed)
Pre visit review using our clinic review tool, if applicable. No additional management support is needed unless otherwise documented below in the visit note. 

## 2016-10-08 ENCOUNTER — Other Ambulatory Visit: Payer: Self-pay | Admitting: Neurology

## 2016-10-08 DIAGNOSIS — R42 Dizziness and giddiness: Secondary | ICD-10-CM

## 2016-10-13 ENCOUNTER — Encounter: Payer: Self-pay | Admitting: Internal Medicine

## 2016-10-13 NOTE — Assessment & Plan Note (Signed)
Seeing neurology.  Undergoing w/up.  Follow.

## 2016-10-13 NOTE — Assessment & Plan Note (Signed)
Blood pressure elevated.  Recheck improved.  Will continue same medication regimen.  Follow pressures.  Follow metabolic panel.

## 2016-10-13 NOTE — Assessment & Plan Note (Signed)
Bradycardia resolved.  Seeing cardiology.  Persistent light headedness as outlined.  Saw neurology.  Concern over orthostatic hypotension.  Encouraged staying hydrated.  Planning for w/up - carotid ultrasound/mri.  Keep f/u with neurology.

## 2016-10-13 NOTE — Assessment & Plan Note (Signed)
Bradycardia has now resolved.  Followed by cardiology.

## 2016-10-13 NOTE — Assessment & Plan Note (Signed)
Low cholesterol diet and exercise.  Follow lipid panel.   

## 2016-10-15 ENCOUNTER — Ambulatory Visit
Admission: RE | Admit: 2016-10-15 | Discharge: 2016-10-15 | Disposition: A | Discharge status: Home / Self Care | Payer: Medicare Other | Source: Ambulatory Visit | Attending: Neurology | Admitting: Neurology

## 2016-10-15 ENCOUNTER — Encounter (INDEPENDENT_AMBULATORY_CARE_PROVIDER_SITE_OTHER): Payer: Self-pay

## 2016-10-15 DIAGNOSIS — R413 Other amnesia: Secondary | ICD-10-CM | POA: Diagnosis not present

## 2016-10-15 DIAGNOSIS — R42 Dizziness and giddiness: Secondary | ICD-10-CM | POA: Diagnosis present

## 2016-10-25 ENCOUNTER — Ambulatory Visit (INDEPENDENT_AMBULATORY_CARE_PROVIDER_SITE_OTHER): Payer: Medicare Other

## 2016-10-25 VITALS — BP 138/74 | HR 61 | Temp 98.0°F | Resp 14 | Ht 60.0 in | Wt 174.1 lb

## 2016-10-25 DIAGNOSIS — Z0000 Encounter for general adult medical examination without abnormal findings: Secondary | ICD-10-CM

## 2016-10-25 NOTE — Progress Notes (Signed)
Subjective:   Cheryl Winters is a 83 y.o. female who presents for Medicare Annual (Subsequent) preventive examination.  Review of Systems:  No ROS.  Medicare Wellness Visit.  Cardiac Risk Factors include: advanced age (>95men, >68 women);hypertension;obesity (BMI >30kg/m2)     Objective:     Vitals: BP 138/74 (BP Location: Right Arm, Patient Position: Sitting, Cuff Size: Normal)   Pulse 61   Temp 98 F (36.7 C) (Oral)   Resp 14   Ht 5' (1.524 m)   Wt 174 lb 1.9 oz (79 kg)   SpO2 98%   BMI 34.01 kg/m   Body mass index is 34.01 kg/m.   Tobacco History  Smoking Status  . Never Smoker  Smokeless Tobacco  . Never Used     Counseling given: Not Answered   Past Medical History:  Diagnosis Date  . Abdominal pain, right upper quadrant   . Abnormal stress test    w/fixed defect  . Benign essential tremor   . Carpal tunnel syndrome   . DJD (degenerative joint disease) of knee   . GERD (gastroesophageal reflux disease)   . HTN (hypertension)   . Hyperlipidemia   . Syncope and collapse   . Ulnar nerve lesion   . UTI (urinary tract infection)   . Vitamin D deficiency    Past Surgical History:  Procedure Laterality Date  . Bilateral Bletharoplastices    . BREAST BIOPSY  1957 and 1962   x 2, Nml per pt  . BREAST CYST EXCISION     x 2 - nml path per pt  . CARPAL TUNNEL RELEASE  2004  . CATARACT EXTRACTION    . TOTAL KNEE ARTHROPLASTY     bilateral, total, Marcy Panning  . TOTAL SHOULDER REPLACEMENT  08/2010   left  . TUBAL LIGATION    . UMBILICAL HERNIA REPAIR    . VAGINAL DELIVERY     1 set Twins/2 single births   Family History  Problem Relation Age of Onset  . Hypertension Mother   . Arthritis Mother   . Stroke Mother   . Dementia Mother   . Early death Father 74    from brain cancer  . Brain cancer Father 12  . Breast cancer Maternal Aunt   . Alcohol abuse Other    History  Sexual Activity  . Sexual activity: Not Currently    Outpatient  Encounter Prescriptions as of 10/25/2016  Medication Sig  . Biotin 5000 MCG TABS Take by mouth daily.  . hydrochlorothiazide (HYDRODIURIL) 25 MG tablet Take 1 tablet (25 mg total) by mouth daily.  Marland Kitchen lisinopril (PRINIVIL,ZESTRIL) 40 MG tablet Take 1 tablet (40 mg total) by mouth daily.  . Multiple Vitamins-Minerals (MULTIVITAMIN WITH MINERALS) tablet Take 1 tablet by mouth daily.   No facility-administered encounter medications on file as of 10/25/2016.     Activities of Daily Living In your present state of health, do you have any difficulty performing the following activities: 10/25/2016  Hearing? N  Vision? N  Difficulty concentrating or making decisions? N  Walking or climbing stairs? N  Dressing or bathing? N  Doing errands, shopping? N  Preparing Food and eating ? N  Using the Toilet? N  In the past six months, have you accidently leaked urine? N  Do you have problems with loss of bowel control? N  Managing your Medications? N  Managing your Finances? Y  Housekeeping or managing your Housekeeping? N  Some recent data might be hidden  Patient Care Team: Dale [place], MD as PCP - General (Internal Medicine)    Assessment:    This is a routine wellness examination for [name]. The goal of the wellness visit is to assist the patient how to close the gaps in care and create a preventative care plan for the patient.   Osteoporosis risk reviewed.  Medications reviewed; taking without issues or barriers.  Safety issues reviewed; lives alone.  Smoke detectors in the home. No firearms in the home. Wears seatbelts when driving or riding with others. No violence in the home.  No identified risk were noted; The patient was oriented x 3; appropriate in dress and manner and no objective failures at ADL's or IADL's.   BMI; discussed the importance of a healthy diet, water intake and exercise. Educational material provided.  HTN; followed by PCP.    Health maintenance gaps;  closed.  Patient Concerns: None at this time. Follow up with PCP as needed.  Exercise Activities and Dietary recommendations Current Exercise Habits: Home exercise routine, Type of exercise: walking, Time (Minutes): 20, Frequency (Times/Week): 4, Weekly Exercise (Minutes/Week): 80, Intensity: Mild  Goals    . Healthy Lifestyle          Stay hydrated and drink plenty of water Make healthy food choices.  Lean meats (chicken, [place], fish).  Vegetables.   Stay active and continue walking as often as possible for exercise.  Chair/standing range of motion exercises.      Fall Risk Fall Risk  10/25/2016 06/25/2016 10/24/2015 09/02/2014 08/02/2013  Falls in the past year? No No Yes No No  Number falls in past yr: - - 1 - -  Injury with Fall? - - No - -  Risk for fall due to : - - - - -  Risk for fall due to (comments): - - - - -  Follow up - - Education provided;Falls prevention discussed - -   Depression Screen PHQ 2/9 Scores 10/25/2016 06/25/2016 10/24/2015 09/02/2014  PHQ - 2 Score 0 0 0 0     Cognitive Function MMSE - Mini Mental State Exam 10/24/2015  Orientation to time 5  Orientation to Place 5  Registration 3  Attention/ Calculation 5  Recall 3  Language- name 2 objects 2  Language- repeat 1  Language- follow 3 step command 3  Language- read & follow direction 1  Write a sentence 0  Write a sentence-comments Tremors  Copy design 0  Copy design-comments Tremors  Total score 28     6CIT Screen 10/25/2016  What Year? 0 points  What month? 0 points  What time? 0 points  Count back from 20 0 points  Months in reverse 0 points    Immunization History  Administered Date(s) Administered  . Influenza Split 07/22/2011, 06/21/2012  . Influenza Whole 07/10/2013  . Influenza, High Dose Seasonal PF 06/25/2016  . Influenza-Unspecified 07/08/2014, 06/23/2015  . Pneumococcal Conjugate-13 03/19/2014  . Pneumococcal Polysaccharide-23 10/19/2003  . Tdap 10/04/2012  . Zoster 09/30/2008    Screening Tests Health Maintenance  Topic Date Due  . TETANUS/TDAP  10/04/2022  . INFLUENZA VACCINE  Completed  . DEXA SCAN  Completed  . ZOSTAVAX  Completed  . PNA vac Low Risk Adult  Completed      Plan:    End of life planning; Advance aging; Advanced directives discussed. Copy of current HCPOA/Living Will requested.  Medicare Attestation I have personally reviewed: The patient's medical and social history Their use of alcohol, tobacco or illicit  drugs Their current medications and supplements The patient's functional ability including ADLs,fall risks, home safety risks, cognitive, and hearing and visual impairment Diet and physical activities Evidence for depression   The patient's weight, height, BMI, and visual acuity have been recorded in the chart.  I have made referrals and provided education to the patient based on review of the above and I have provided the patient with a written personalized care plan for preventive services.    During the course of the visit the patient was educated and counseled about the following appropriate screening and preventive services:   Vaccines to include Pneumoccal, Influenza, Hepatitis B, Td, Zostavax, HCV  Electrocardiogram  Cardiovascular Disease  Colorectal cancer screening  Bone density screening  Diabetes screening  Glaucoma screening  Mammography/PAP  Nutrition counseling   Patient Instructions (the written plan) was given to the patient.   Ashok PallOBrien-Blaney, Jadene Stemmer L, LPN  1/6/10961/05/2017   Reviewed above information.  Agree with plan.  Dr Lorin PicketScott

## 2016-10-25 NOTE — Patient Instructions (Addendum)
Ms. Cheryl Winters , Thank you for taking time to come for your Medicare Wellness Visit. I appreciate your ongoing commitment to your health goals. Please review the following plan we discussed and let me know if I can assist you in the future.   Follow up with Dr. Lorin PicketScott as needed.  These are the goals we discussed: Goals    . Healthy Lifestyle          Stay hydrated and drink plenty of water Make healthy food choices.  Lean meats (chicken, [place], fish).  Vegetables.   Stay active and continue walking as often as possible for exercise.  Chair/standing range of motion exercises.       This is a list of the screening recommended for you and due dates:  Health Maintenance  Topic Date Due  . Tetanus Vaccine  10/04/2022  . Flu Shot  Completed  . DEXA scan (bone density measurement)  Completed  . Shingles Vaccine  Completed  . Pneumonia vaccines  Completed      Fall Prevention in the Home Introduction Falls can cause injuries. They can happen to people of all ages. There are many things you can do to make your home safe and to help prevent falls. What can I do on the outside of my home?  Regularly fix the edges of walkways and driveways and fix any cracks.  Remove anything that might make you trip as you walk through a door, such as a raised step or threshold.  Trim any bushes or trees on the path to your home.  Use bright outdoor lighting.  Clear any walking paths of anything that might make someone trip, such as rocks or tools.  Regularly check to see if handrails are loose or broken. Make sure that both sides of any steps have handrails.  Any raised decks and porches should have guardrails on the edges.  Have any leaves, snow, or ice cleared regularly.  Use sand or salt on walking paths during winter.  Clean up any spills in your garage right away. This includes oil or grease spills. What can I do in the bathroom?  Use night lights.  Install grab bars by the toilet and in  the tub and shower. Do not use towel bars as grab bars.  Use non-skid mats or decals in the tub or shower.  If you need to sit down in the shower, use a plastic, non-slip stool.  Keep the floor dry. Clean up any water that spills on the floor as soon as it happens.  Remove soap buildup in the tub or shower regularly.  Attach bath mats securely with double-sided non-slip rug tape.  Do not have throw rugs and other things on the floor that can make you trip. What can I do in the bedroom?  Use night lights.  Make sure that you have a light by your bed that is easy to reach.  Do not use any sheets or blankets that are too big for your bed. They should not hang down onto the floor.  Have a firm chair that has side arms. You can use this for support while you get dressed.  Do not have throw rugs and other things on the floor that can make you trip. What can I do in the kitchen?  Clean up any spills right away.  Avoid walking on wet floors.  Keep items that you use a lot in easy-to-reach places.  If you need to reach something above you, use  a strong step stool that has a grab bar.  Keep electrical cords out of the way.  Do not use floor polish or wax that makes floors slippery. If you must use wax, use non-skid floor wax.  Do not have throw rugs and other things on the floor that can make you trip. What can I do with my stairs?  Do not leave any items on the stairs.  Make sure that there are handrails on both sides of the stairs and use them. Fix handrails that are broken or loose. Make sure that handrails are as long as the stairways.  Check any carpeting to make sure that it is firmly attached to the stairs. Fix any carpet that is loose or worn.  Avoid having throw rugs at the top or bottom of the stairs. If you do have throw rugs, attach them to the floor with carpet tape.  Make sure that you have a light switch at the top of the stairs and the bottom of the stairs. If  you do not have them, ask someone to add them for you. What else can I do to help prevent falls?  Wear shoes that:  Do not have high heels.  Have rubber bottoms.  Are comfortable and fit you well.  Are closed at the toe. Do not wear sandals.  If you use a stepladder:  Make sure that it is fully opened. Do not climb a closed stepladder.  Make sure that both sides of the stepladder are locked into place.  Ask someone to hold it for you, if possible.  Clearly mark and make sure that you can see:  Any grab bars or handrails.  First and last steps.  Where the edge of each step is.  Use tools that help you move around (mobility aids) if they are needed. These include:  Canes.  Walkers.  Scooters.  Crutches.  Turn on the lights when you go into a dark area. Replace any light bulbs as soon as they burn out.  Set up your furniture so you have a clear path. Avoid moving your furniture around.  If any of your floors are uneven, fix them.  If there are any pets around you, be aware of where they are.  Review your medicines with your doctor. Some medicines can make you feel dizzy. This can increase your chance of falling. Ask your doctor what other things that you can do to help prevent falls. This information is not intended to replace advice given to you by your health care provider. Make sure you discuss any questions you have with your health care provider. Document Released: 07/31/2009 Document Revised: 03/11/2016 Document Reviewed: 11/08/2014  2017 Elsevier

## 2016-11-23 ENCOUNTER — Other Ambulatory Visit: Payer: Medicare Other

## 2016-11-29 ENCOUNTER — Ambulatory Visit: Payer: Medicare Other | Admitting: Internal Medicine

## 2016-11-30 ENCOUNTER — Other Ambulatory Visit (INDEPENDENT_AMBULATORY_CARE_PROVIDER_SITE_OTHER): Payer: Medicare Other

## 2016-11-30 DIAGNOSIS — E785 Hyperlipidemia, unspecified: Secondary | ICD-10-CM

## 2016-11-30 DIAGNOSIS — I10 Essential (primary) hypertension: Secondary | ICD-10-CM | POA: Diagnosis not present

## 2016-11-30 LAB — LIPID PANEL
Cholesterol: 228 mg/dL — ABNORMAL HIGH (ref 0–200)
HDL: 76 mg/dL (ref >39.00)
LDL CALC: 143 mg/dL — AB (ref 0–99)
NONHDL: 151.58
Total CHOL/HDL Ratio: 3
Triglycerides: 45 mg/dL (ref 0.0–149.0)
VLDL: 9 mg/dL (ref 0.0–40.0)

## 2016-11-30 LAB — BASIC METABOLIC PANEL
BUN: 18 mg/dL (ref 6–23)
CO2: 32 meq/L (ref 19–32)
Calcium: 9.7 mg/dL (ref 8.4–10.5)
Chloride: 104 mEq/L (ref 96–112)
Creatinine, Ser: 0.65 mg/dL (ref 0.40–1.20)
GFR: 93.01 mL/min (ref >60.00)
GLUCOSE: 86 mg/dL (ref 70–99)
POTASSIUM: 3.7 meq/L (ref 3.5–5.1)
SODIUM: 140 meq/L (ref 135–145)

## 2016-11-30 LAB — HEPATIC FUNCTION PANEL
ALT: 13 U/L (ref 0–35)
AST: 19 U/L (ref 0–37)
Albumin: 4.2 g/dL (ref 3.5–5.2)
Alkaline Phosphatase: 56 U/L (ref 39–117)
BILIRUBIN TOTAL: 0.9 mg/dL (ref 0.2–1.2)
Bilirubin, Direct: 0.2 mg/dL (ref 0.0–0.3)
Total Protein: 6.9 g/dL (ref 6.0–8.3)

## 2016-12-13 ENCOUNTER — Ambulatory Visit (INDEPENDENT_AMBULATORY_CARE_PROVIDER_SITE_OTHER): Payer: Medicare Other | Admitting: Internal Medicine

## 2016-12-13 ENCOUNTER — Encounter: Payer: Self-pay | Admitting: Internal Medicine

## 2016-12-13 DIAGNOSIS — R413 Other amnesia: Secondary | ICD-10-CM

## 2016-12-13 DIAGNOSIS — I10 Essential (primary) hypertension: Secondary | ICD-10-CM

## 2016-12-13 DIAGNOSIS — E785 Hyperlipidemia, unspecified: Secondary | ICD-10-CM

## 2016-12-13 NOTE — Progress Notes (Signed)
Patient ID: Cheryl Winters, female   DOB: 01/05/1934, 83 y.o.   MRN: [id number]   Subjective:    Patient ID: Cheryl Winters, female    DOB: [birth year], 84 y.o.   MRN: [id number]  HPI  Patient here for a scheduled follow up.  She is accompanied by her daugther Almira Coaster.  History obtained from both of them.  She was evaluated by neurology.  Diagnosed with mixed dementia.  MRI - chronic tiny lacunar infarct right cerebellum.  Was given buspar for anxiety.  Also instructed to take melatonin for sleep.  She seems to be doing better.  No chest pain.  No sob.  No acid reflux.   abdominal pain or cramping.  Bowels stable.  She does worry about family, etc.  Discussed with her today.  Appears to be doing better.    Past Medical History:  Diagnosis Date  . Abdominal pain, right upper quadrant   . Abnormal stress test    w/fixed defect  . Benign essential tremor   . Carpal tunnel syndrome   . DJD (degenerative joint disease) of knee   . GERD (gastroesophageal reflux disease)   . HTN (hypertension)   . Hyperlipidemia   . Syncope and collapse   . Ulnar nerve lesion   . UTI (urinary tract infection)   . Vitamin D deficiency    Past Surgical History:  Procedure Laterality Date  . Bilateral Bletharoplastices    . BREAST BIOPSY  1957 and 1962   x 2, Nml per pt  . BREAST CYST EXCISION     x 2 - nml path per pt  . CARPAL TUNNEL RELEASE  2004  . CATARACT EXTRACTION    . TOTAL KNEE ARTHROPLASTY     bilateral, total, Marcy Panning  . TOTAL SHOULDER REPLACEMENT  08/2010   left  . TUBAL LIGATION    . UMBILICAL HERNIA REPAIR    . VAGINAL DELIVERY     1 set Twins/2 single births   Family History  Problem Relation Age of Onset  . Hypertension Mother   . Arthritis Mother   . Stroke Mother   . Dementia Mother   . Early death Father 42    from brain cancer  . Brain cancer Father 63  . Breast cancer Maternal Aunt   . Alcohol abuse Other    Social History   Social History  . Marital status:  Widowed    Spouse name: N/A  . Number of children: 4  . Years of education: N/A   Occupational History  . Retired - Biomedical scientist    Social History Main Topics  . Smoking status: Never Smoker  . Smokeless tobacco: Never Used  . Alcohol use No  . Drug use: No  . Sexual activity: Not Currently   Other Topics Concern  . None   Social History Narrative   Lives alone in [place]. Widow. Has children who live nearby.    Outpatient Encounter Prescriptions as of 12/13/2016  Medication Sig  . Biotin 5000 MCG TABS Take by mouth daily.  . hydrochlorothiazide (HYDRODIURIL) 25 MG tablet Take 1 tablet (25 mg total) by mouth daily.  Marland Kitchen lisinopril (PRINIVIL,ZESTRIL) 40 MG tablet Take 1 tablet (40 mg total) by mouth daily.  . Multiple Vitamins-Minerals (MULTIVITAMIN WITH MINERALS) tablet Take 1 tablet by mouth daily.   No facility-administered encounter medications on file as of 12/13/2016.     Review of Systems  Constitutional: Negative for appetite change and unexpected weight change.  HENT: Negative for congestion and sinus pressure.   Respiratory: Negative for cough, chest tightness and shortness of breath.   Cardiovascular: Negative for chest pain, palpitations and leg swelling.  Gastrointestinal: Negative for abdominal pain, diarrhea, nausea and vomiting.  Genitourinary: Negative for difficulty urinating and dysuria.  Musculoskeletal: Negative for back pain and joint swelling.  Skin: Negative for color change and rash.  Neurological: Negative for dizziness, light-headedness and headaches.  Psychiatric/Behavioral: Negative for agitation and dysphoric mood.       Objective:    Physical Exam  Constitutional: She appears well-developed and well-nourished. No distress.  HENT:  Nose: Nose normal.  Mouth/Throat: Oropharynx is clear and moist.  Neck: Neck supple. No thyromegaly present.  Cardiovascular: Normal rate and regular rhythm.   Pulmonary/Chest: Breath sounds normal. No  respiratory distress. She has no wheezes.  Abdominal: Soft. Bowel sounds are normal. There is no tenderness.  Musculoskeletal: She exhibits no edema or tenderness.  Lymphadenopathy:    She has no cervical adenopathy.  Skin: No rash noted. No erythema.  Psychiatric: She has a normal mood and affect. Her behavior is normal.    BP 136/72 (BP Location: Left Arm, Patient Position: Sitting, Cuff Size: Large)   Pulse 65   Temp 98.6 F (37 C) (Oral)   Resp 16   Ht 5' (1.524 m)   Wt 180 lb 3.2 oz (81.7 kg)   SpO2 94%   BMI 35.19 kg/m  Wt Readings from Last 3 Encounters:  12/13/16 180 lb 3.2 oz (81.7 kg)  10/25/16 174 lb 1.9 oz (79 kg)  10/07/16 177 lb 9.6 oz (80.6 kg)     Lab Results  Component Value Date   WBC 4.1 07/02/2016   HGB 13.1 07/02/2016   HCT 38.2 07/02/2016   PLT 201.0 07/02/2016   GLUCOSE 86 11/30/2016   CHOL 228 (H) 11/30/2016   TRIG 45.0 11/30/2016   HDL 76.00 11/30/2016   LDLDIRECT 123.9 08/27/2013   LDLCALC 143 (H) 11/30/2016   ALT 13 11/30/2016   AST 19 11/30/2016   NA 140 11/30/2016   K 3.7 11/30/2016   CL 104 11/30/2016   CREATININE 0.65 11/30/2016   BUN 18 11/30/2016   CO2 32 11/30/2016   TSH 2.78 07/02/2016   INR 1.0 10/07/2014   MICROALBUR 0.3 09/02/2014    Mr Brain Wo Contrast  Result Date: 10/15/2016 CLINICAL DATA:  83 year old female with dizziness, feeling unsteady on her feet and memory loss for 2-3 months. Initial encounter. EXAM: MRI HEAD WITHOUT CONTRAST TECHNIQUE: Multiplanar, multiecho pulse sequences of the brain and surrounding structures were obtained without intravenous contrast. COMPARISON:  Head CT without contrast 09/11/2014. Brain MRI 10/29/2011. FINDINGS: Brain: Cerebral volume is stable to slightly decreased since 2013. No restricted diffusion to suggest acute infarction. No midline shift, mass effect, evidence of mass lesion, ventriculomegaly, extra-axial collection or acute intracranial hemorrhage. Cervicomedullary junction and  pituitary are within normal limits. Widespread chronic cerebral white matter T2 and FLAIR hyperintensity. Chronic involvement of the deep white matter capsules greater on the right. Superimposed bilateral chronic T2 heterogeneity in the deep gray matter nuclei. There is a tiny chronic lacunar infarct in the right cerebellum, and the brainstem and cerebellum otherwise appear normal for age. No cortical encephalomalacia. There are scattered chronic micro hemorrhages throughout both cerebral hemispheres (e.g. Series 11, image 17). Vascular: Major intracranial vascular flow voids are stable since 2013. Skull and upper cervical spine: Degenerative ligamentous hypertrophy about the odontoid. Degenerative appearing anterolisthesis in the cervical spine at  C4-C5 which might be associated with multifactorial degenerative spinal stenosis at that level. Visualized bone marrow signal is within normal limits. Sinuses/Orbits: Stable and negative. Other: Visible internal auditory structures appear normal. Mastoids remain clear. Negative scalp soft tissues. IMPRESSION: 1.  No acute intracranial abnormality. 2. Moderately advanced chronic signal changes in the brain most compatible with sequelae of small vessel disease, including scattered chronic micro hemorrhages. No significant change since 2013. Electronically Signed   By: Odessa FlemingH  Hall M.D.   On: 10/15/2016 16:05       Assessment & Plan:   Problem List Items Addressed This Visit    Hyperlipidemia    Low cholesterol diet and exercise.  Follow lipid panel.        Relevant Orders   Hepatic function panel   Lipid panel   Hypertension    Blood pressure under good control.  Continue same medication regimen.  Follow pressures.  Follow metabolic panel.        Relevant Orders   Basic metabolic panel   Memory change    Seeing neurology.  Diagnosed with mixed dementia.  Started on melatonin for sleep and buspar for anxiety.  Follow.            Dale DurhamSCOTT, Charmagne Buhl, MD

## 2016-12-13 NOTE — Progress Notes (Signed)
Pre-visit discussion using our clinic review tool. No additional management support is needed unless otherwise documented below in the visit note.  

## 2016-12-25 ENCOUNTER — Encounter: Payer: Self-pay | Admitting: Internal Medicine

## 2016-12-25 NOTE — Assessment & Plan Note (Signed)
Blood pressure under good control.  Continue same medication regimen.  Follow pressures.  Follow metabolic panel.   

## 2016-12-25 NOTE — Assessment & Plan Note (Signed)
Seeing neurology.  Diagnosed with mixed dementia.  Started on melatonin for sleep and buspar for anxiety.  Follow.

## 2016-12-25 NOTE — Assessment & Plan Note (Signed)
Low cholesterol diet and exercise.  Follow lipid panel.   

## 2017-04-12 ENCOUNTER — Other Ambulatory Visit (INDEPENDENT_AMBULATORY_CARE_PROVIDER_SITE_OTHER): Payer: Medicare Other

## 2017-04-12 DIAGNOSIS — I10 Essential (primary) hypertension: Secondary | ICD-10-CM

## 2017-04-12 DIAGNOSIS — E785 Hyperlipidemia, unspecified: Secondary | ICD-10-CM | POA: Diagnosis not present

## 2017-04-12 LAB — HEPATIC FUNCTION PANEL
ALT: 11 U/L (ref 0–35)
AST: 16 U/L (ref 0–37)
Albumin: 4 g/dL (ref 3.5–5.2)
Alkaline Phosphatase: 50 U/L (ref 39–117)
BILIRUBIN DIRECT: 0.2 mg/dL (ref 0.0–0.3)
Total Bilirubin: 0.9 mg/dL (ref 0.2–1.2)
Total Protein: 6.6 g/dL (ref 6.0–8.3)

## 2017-04-12 LAB — BASIC METABOLIC PANEL
BUN: 18 mg/dL (ref 6–23)
CO2: 31 mEq/L (ref 19–32)
Calcium: 9.7 mg/dL (ref 8.4–10.5)
Chloride: 105 mEq/L (ref 96–112)
Creatinine, Ser: 0.68 mg/dL (ref 0.40–1.20)
GFR: 88.21 mL/min (ref >60.00)
Glucose, Bld: 95 mg/dL (ref 70–99)
Potassium: 4.1 mEq/L (ref 3.5–5.1)
SODIUM: 141 meq/L (ref 135–145)

## 2017-04-12 LAB — LIPID PANEL
CHOL/HDL RATIO: 4
CHOLESTEROL: 250 mg/dL — AB (ref 0–200)
HDL: 66.1 mg/dL (ref >39.00)
LDL CALC: 172 mg/dL — AB (ref 0–99)
NonHDL: 184.16
Triglycerides: 62 mg/dL (ref 0.0–149.0)
VLDL: 12.4 mg/dL (ref 0.0–40.0)

## 2017-04-14 ENCOUNTER — Telehealth: Payer: Self-pay

## 2017-04-14 ENCOUNTER — Encounter: Payer: Self-pay | Admitting: Internal Medicine

## 2017-04-14 ENCOUNTER — Ambulatory Visit (INDEPENDENT_AMBULATORY_CARE_PROVIDER_SITE_OTHER): Payer: Medicare Other | Admitting: Internal Medicine

## 2017-04-14 VITALS — BP 132/78 | HR 58 | Temp 98.6°F | Resp 12 | Ht 60.0 in | Wt 176.8 lb

## 2017-04-14 DIAGNOSIS — Z1231 Encounter for screening mammogram for malignant neoplasm of breast: Secondary | ICD-10-CM

## 2017-04-14 DIAGNOSIS — E785 Hyperlipidemia, unspecified: Secondary | ICD-10-CM

## 2017-04-14 DIAGNOSIS — G250 Essential tremor: Secondary | ICD-10-CM

## 2017-04-14 DIAGNOSIS — R42 Dizziness and giddiness: Secondary | ICD-10-CM | POA: Diagnosis not present

## 2017-04-14 DIAGNOSIS — I10 Essential (primary) hypertension: Secondary | ICD-10-CM

## 2017-04-14 DIAGNOSIS — R413 Other amnesia: Secondary | ICD-10-CM

## 2017-04-14 DIAGNOSIS — Z1239 Encounter for other screening for malignant neoplasm of breast: Secondary | ICD-10-CM

## 2017-04-14 MED ORDER — LISINOPRIL 40 MG PO TABS: 40.0000 mg | ORAL_TABLET | Freq: Every day | ORAL | 2 refills | Status: DC

## 2017-04-14 MED ORDER — ROSUVASTATIN CALCIUM 10 MG PO TABS: 10.0000 mg | ORAL_TABLET | Freq: Every day | ORAL | 1 refills | Status: DC

## 2017-04-14 MED ORDER — HYDROCHLOROTHIAZIDE 25 MG PO TABS: 25.0000 mg | ORAL_TABLET | Freq: Every day | ORAL | 2 refills | Status: DC

## 2017-04-14 NOTE — Progress Notes (Signed)
Patient ID: Cheryl Winters, female   DOB: [birth year], 83 y.o.   MRN: [id number]   Subjective:    Patient ID: Cheryl Winters, female    DOB: Jul 27, 1934, 83 y.o.   MRN: [id number]  HPI  Patient here for her physical, but was decided just to do a f/u visit.   She is accompanied by her daughter.  History obtained from both of them.  I was talking with her daughter prior to going in to the room and Cheryl Winters got dressed back in her regular clothes and decided just to do a regular f/u.  Family with concerns regarding her memory.  Discussed with her today.  She feels she is able to do things around the house that are needed to be done.  Daughter handles her checkbook.  She drives short distances (to store, to church, etc).  We discussed no long distance trips.  States has never got lost driving.  Problem with some short term memory.  Sees neurology.  Notes reviewed.  Reports she is eating.  No chest pain.  No nausea or vomiting.  Bowels moving.  Dizziness not a significant issue for her now.  Discussed lab results.  Discussed elevated cholesterol.  Dicussed starting medication.  She was agreeable.  Per neurology note, was to start propranolol for tremors.  On buspar.  Need to confirm if taking.    Past Medical History:  Diagnosis Date  . Abdominal pain, right upper quadrant   . Abnormal stress test    w/fixed defect  . Benign essential tremor   . Carpal tunnel syndrome   . DJD (degenerative joint disease) of knee   . GERD (gastroesophageal reflux disease)   . HTN (hypertension)   . Hyperlipidemia   . Syncope and collapse   . Ulnar nerve lesion   . UTI (urinary tract infection)   . Vitamin D deficiency    Past Surgical History:  Procedure Laterality Date  . Bilateral Bletharoplastices    . BREAST BIOPSY  1957 and 1962   x 2, Nml per pt  . BREAST CYST EXCISION     x 2 - nml path per pt  . CARPAL TUNNEL RELEASE  2004  . CATARACT EXTRACTION    . TOTAL KNEE ARTHROPLASTY     bilateral, total,  Marcy Panning  . TOTAL SHOULDER REPLACEMENT  08/2010   left  . TUBAL LIGATION    . UMBILICAL HERNIA REPAIR    . VAGINAL DELIVERY     1 set Twins/2 single births   Family History  Problem Relation Age of Onset  . Hypertension Mother   . Arthritis Mother   . Stroke Mother   . Dementia Mother   . Early death Father 77       from brain cancer  . Brain cancer Father 46  . Breast cancer Maternal Aunt   . Alcohol abuse Other    Social History   Social History  . Marital status: Widowed    Spouse name: N/A  . Number of children: 4  . Years of education: N/A   Occupational History  . Retired - Biomedical scientist    Social History Main Topics  . Smoking status: Never Smoker  . Smokeless tobacco: Never Used  . Alcohol use No  . Drug use: No  . Sexual activity: Not Currently   Other Topics Concern  . None   Social History Narrative   Lives alone in [place]. Widow. Has children who live nearby.  Outpatient Encounter Prescriptions as of 04/14/2017  Medication Sig  . Biotin 5000 MCG TABS Take by mouth daily.  . hydrochlorothiazide (HYDRODIURIL) 25 MG tablet Take 1 tablet (25 mg total) by mouth daily.  Marland Kitchen. lisinopril (PRINIVIL,ZESTRIL) 40 MG tablet Take 1 tablet (40 mg total) by mouth daily.  . Multiple Vitamins-Minerals (MULTIVITAMIN WITH MINERALS) tablet Take 1 tablet by mouth daily.  . [DISCONTINUED] hydrochlorothiazide (HYDRODIURIL) 25 MG tablet Take 1 tablet (25 mg total) by mouth daily.  . [DISCONTINUED] lisinopril (PRINIVIL,ZESTRIL) 40 MG tablet Take 1 tablet (40 mg total) by mouth daily.  . rosuvastatin (CRESTOR) 10 MG tablet Take 1 tablet (10 mg total) by mouth daily.   No facility-administered encounter medications on file as of 04/14/2017.     Review of Systems  Constitutional: Negative for appetite change and unexpected weight change.  HENT: Negative for congestion and sinus pressure.   Respiratory: Negative for cough, chest tightness and shortness of breath.     Cardiovascular: Negative for chest pain, palpitations and leg swelling.  Gastrointestinal: Negative for abdominal pain, diarrhea, nausea and vomiting.  Genitourinary: Negative for difficulty urinating and dysuria.  Musculoskeletal: Negative for back pain and joint swelling.  Skin: Negative for color change and rash.  Neurological: Negative for headaches.       No significant dizziness.  Memory change as outlined.    Hematological: Negative for adenopathy. Does not bruise/bleed easily.  Psychiatric/Behavioral: Negative for agitation and dysphoric mood.       Objective:    Physical Exam  Constitutional: She appears well-developed and well-nourished. No distress.  HENT:  Nose: Nose normal.  Mouth/Throat: Oropharynx is clear and moist.  Eyes: Conjunctivae are normal. Right eye exhibits no discharge. Left eye exhibits no discharge.  Neck: Neck supple. No thyromegaly present.  Cardiovascular: Normal rate and regular rhythm.   Pulmonary/Chest: Breath sounds normal. No respiratory distress. She has no wheezes.  Abdominal: Soft. Bowel sounds are normal. There is no tenderness.  Musculoskeletal: She exhibits no edema or tenderness.  Lymphadenopathy:    She has no cervical adenopathy.  Skin: No rash noted. No erythema.  Psychiatric: She has a normal mood and affect. Her behavior is normal.    BP 132/78   Pulse (!) 58   Temp 98.6 F (37 C) (Oral)   Resp 12   Ht 5' (1.524 m)   Wt 176 lb 12.8 oz (80.2 kg)   SpO2 95%   BMI 34.53 kg/m  Wt Readings from Last 3 Encounters:  04/14/17 176 lb 12.8 oz (80.2 kg)  12/13/16 180 lb 3.2 oz (81.7 kg)  10/25/16 174 lb 1.9 oz (79 kg)     Lab Results  Component Value Date   WBC 4.1 07/02/2016   HGB 13.1 07/02/2016   HCT 38.2 07/02/2016   PLT 201.0 07/02/2016   GLUCOSE 95 04/12/2017   CHOL 250 (H) 04/12/2017   TRIG 62.0 04/12/2017   HDL 66.10 04/12/2017   LDLDIRECT 123.9 08/27/2013   LDLCALC 172 (H) 04/12/2017   ALT 11 04/12/2017   AST  16 04/12/2017   NA 141 04/12/2017   K 4.1 04/12/2017   CL 105 04/12/2017   CREATININE 0.68 04/12/2017   BUN 18 04/12/2017   CO2 31 04/12/2017   TSH 2.78 07/02/2016   INR 1.0 10/07/2014   MICROALBUR 0.3 09/02/2014    Mr Brain Wo Contrast  Result Date: 10/15/2016 CLINICAL DATA:  83 year old female with dizziness, feeling unsteady on her feet and memory loss for 2-3 months. Initial encounter.  EXAM: MRI HEAD WITHOUT CONTRAST TECHNIQUE: Multiplanar, multiecho pulse sequences of the brain and surrounding structures were obtained without intravenous contrast. COMPARISON:  Head CT without contrast 09/11/2014. Brain MRI 10/29/2011. FINDINGS: Brain: Cerebral volume is stable to slightly decreased since 2013. No restricted diffusion to suggest acute infarction. No midline shift, mass effect, evidence of mass lesion, ventriculomegaly, extra-axial collection or acute intracranial hemorrhage. Cervicomedullary junction and pituitary are within normal limits. Widespread chronic cerebral white matter T2 and FLAIR hyperintensity. Chronic involvement of the deep white matter capsules greater on the right. Superimposed bilateral chronic T2 heterogeneity in the deep gray matter nuclei. There is a tiny chronic lacunar infarct in the right cerebellum, and the brainstem and cerebellum otherwise appear normal for age. No cortical encephalomalacia. There are scattered chronic micro hemorrhages throughout both cerebral hemispheres (e.g. Series 11, image 17). Vascular: Major intracranial vascular flow voids are stable since 2013. Skull and upper cervical spine: Degenerative ligamentous hypertrophy about the odontoid. Degenerative appearing anterolisthesis in the cervical spine at C4-C5 which might be associated with multifactorial degenerative spinal stenosis at that level. Visualized bone marrow signal is within normal limits. Sinuses/Orbits: Stable and negative. Other: Visible internal auditory structures appear normal.  Mastoids remain clear. Negative scalp soft tissues. IMPRESSION: 1.  No acute intracranial abnormality. 2. Moderately advanced chronic signal changes in the brain most compatible with sequelae of small vessel disease, including scattered chronic micro hemorrhages. No significant change since 2013. Electronically Signed   By: Odessa Fleming M.D.   On: 10/15/2016 16:05       Assessment & Plan:   Problem List Items Addressed This Visit    Hyperlipidemia    Discussed cholesterol results.  Discussed starting medication.  She agrees.  Start crestor daily.  Check liver panel in 6 weeks.        Relevant Medications   hydrochlorothiazide (HYDRODIURIL) 25 MG tablet   lisinopril (PRINIVIL,ZESTRIL) 40 MG tablet   rosuvastatin (CRESTOR) 10 MG tablet   Other Relevant Orders   Hepatic function panel   Hypertension    Blood pressure under good control.  Continue same medication regimen.  Follow pressures.  Follow metabolic panel.        Relevant Medications   hydrochlorothiazide (HYDRODIURIL) 25 MG tablet   lisinopril (PRINIVIL,ZESTRIL) 40 MG tablet   rosuvastatin (CRESTOR) 10 MG tablet   Lightheadedness    Has seen cardiology.  Not an significant issue for her now.  Follow.        Memory change - Primary    Seeing neurology.  Diagnosed with mixed dementia.  Should be on buspar for anxiety.  Need to confirm taking.  Discussed with her and her daughter at length.  Discussed looking into assisted living options, caretakers in the home, etc.  Pt does not feels she needs this at this point.  Discussed safety in the home.  Discussed not driving long distances, etc.  Continue f/u with neurology.        Screening for breast cancer    Discussed with her today.  She is agreeable to mammogram.  Schedule.        Relevant Orders   MM DIGITAL SCREENING BILATERAL   Tremor, essential    Seeing neurology.  Note reviewed.  Should be on propranolol.  Need to confirm taking.  Stable.          I spent over 40  minutes with the patient and more than 50% of the time was spent in consultation regarding the above.  Time  spent discussing current concern regarding memory, driving, living situation, etc.  Also time spent discussing treatment options.  Discussed labs and treatment for high cholesterol.     [place], MD

## 2017-04-14 NOTE — Telephone Encounter (Signed)
Called l/m on patient with time and date of app. I have mailed letter as well.

## 2017-04-14 NOTE — Progress Notes (Signed)
Pre-visit discussion using our clinic review tool. No additional management support is needed unless otherwise documented below in the visit note.  

## 2017-04-14 NOTE — Telephone Encounter (Signed)
Was not able to reach office. I have called and made app for mammogram at [facility] on 04/28/17 at 11:00. Need to call patient to let her know.

## 2017-04-17 ENCOUNTER — Encounter: Payer: Self-pay | Admitting: Internal Medicine

## 2017-04-17 NOTE — Assessment & Plan Note (Signed)
Discussed with her today.  She is agreeable to mammogram.  Schedule.

## 2017-04-17 NOTE — Assessment & Plan Note (Signed)
Discussed cholesterol results.  Discussed starting medication.  She agrees.  Start crestor daily.  Check liver panel in 6 weeks.

## 2017-04-17 NOTE — Assessment & Plan Note (Signed)
Blood pressure under good control.  Continue same medication regimen.  Follow pressures.  Follow metabolic panel.   

## 2017-04-17 NOTE — Assessment & Plan Note (Signed)
Seeing neurology.  Note reviewed.  Should be on propranolol.  Need to confirm taking.  Stable.

## 2017-04-17 NOTE — Assessment & Plan Note (Signed)
Seeing neurology.  Diagnosed with mixed dementia.  Should be on buspar for anxiety.  Need to confirm taking.  Discussed with her and her daughter at length.  Discussed looking into assisted living options, caretakers in the home, etc.  Pt does not feels she needs this at this point.  Discussed safety in the home.  Discussed not driving long distances, etc.  Continue f/u with neurology.

## 2017-04-17 NOTE — Assessment & Plan Note (Signed)
Has seen cardiology.  Not an significant issue for her now.  Follow.

## 2017-04-26 ENCOUNTER — Ambulatory Visit
Admission: RE | Admit: 2017-04-26 | Discharge: 2017-04-26 | Disposition: A | Discharge status: Home / Self Care | Payer: Medicare Other | Source: Ambulatory Visit | Attending: Internal Medicine | Admitting: Internal Medicine

## 2017-04-26 DIAGNOSIS — Z1231 Encounter for screening mammogram for malignant neoplasm of breast: Secondary | ICD-10-CM | POA: Insufficient documentation

## 2017-04-26 DIAGNOSIS — Z1239 Encounter for other screening for malignant neoplasm of breast: Secondary | ICD-10-CM

## 2017-04-28 ENCOUNTER — Encounter: Payer: Self-pay | Admitting: Internal Medicine

## 2017-05-24 ENCOUNTER — Other Ambulatory Visit (INDEPENDENT_AMBULATORY_CARE_PROVIDER_SITE_OTHER): Payer: Medicare Other

## 2017-05-24 DIAGNOSIS — E785 Hyperlipidemia, unspecified: Secondary | ICD-10-CM | POA: Diagnosis not present

## 2017-05-24 LAB — HEPATIC FUNCTION PANEL
ALT: 12 U/L (ref 0–35)
AST: 18 U/L (ref 0–37)
Albumin: 3.9 g/dL (ref 3.5–5.2)
Alkaline Phosphatase: 45 U/L (ref 39–117)
BILIRUBIN TOTAL: 0.9 mg/dL (ref 0.2–1.2)
Bilirubin, Direct: 0.2 mg/dL (ref 0.0–0.3)
Total Protein: 6.9 g/dL (ref 6.0–8.3)

## 2017-05-25 ENCOUNTER — Other Ambulatory Visit: Payer: Self-pay | Admitting: Internal Medicine

## 2017-05-25 DIAGNOSIS — I10 Essential (primary) hypertension: Secondary | ICD-10-CM

## 2017-05-25 DIAGNOSIS — E785 Hyperlipidemia, unspecified: Secondary | ICD-10-CM

## 2017-05-25 NOTE — Progress Notes (Signed)
Order placed for f/u labs.  

## 2017-06-09 ENCOUNTER — Other Ambulatory Visit: Payer: Self-pay | Admitting: Internal Medicine

## 2017-07-15 ENCOUNTER — Other Ambulatory Visit (INDEPENDENT_AMBULATORY_CARE_PROVIDER_SITE_OTHER): Payer: Medicare Other

## 2017-07-15 ENCOUNTER — Other Ambulatory Visit: Payer: Medicare Other

## 2017-07-15 DIAGNOSIS — E785 Hyperlipidemia, unspecified: Secondary | ICD-10-CM | POA: Diagnosis not present

## 2017-07-15 DIAGNOSIS — I10 Essential (primary) hypertension: Secondary | ICD-10-CM | POA: Diagnosis not present

## 2017-07-15 LAB — BASIC METABOLIC PANEL
BUN: 18 mg/dL (ref 6–23)
CHLORIDE: 102 meq/L (ref 96–112)
CO2: 30 meq/L (ref 19–32)
Calcium: 9.6 mg/dL (ref 8.4–10.5)
Creatinine, Ser: 0.66 mg/dL (ref 0.40–1.20)
GFR: 91.24 mL/min (ref >60.00)
Glucose, Bld: 90 mg/dL (ref 70–99)
POTASSIUM: 3.8 meq/L (ref 3.5–5.1)
Sodium: 139 mEq/L (ref 135–145)

## 2017-07-15 LAB — HEPATIC FUNCTION PANEL
ALBUMIN: 3.9 g/dL (ref 3.5–5.2)
ALK PHOS: 52 U/L (ref 39–117)
ALT: 13 U/L (ref 0–35)
AST: 16 U/L (ref 0–37)
Bilirubin, Direct: 0.1 mg/dL (ref 0.0–0.3)
TOTAL PROTEIN: 6.4 g/dL (ref 6.0–8.3)
Total Bilirubin: 0.8 mg/dL (ref 0.2–1.2)

## 2017-07-15 LAB — CBC WITH DIFFERENTIAL/PLATELET
BASOS ABS: 0 10*3/uL (ref 0.0–0.1)
Basophils Relative: 0.7 % (ref 0.0–3.0)
EOS PCT: 3.9 % (ref 0.0–5.0)
Eosinophils Absolute: 0.2 10*3/uL (ref 0.0–0.7)
HEMATOCRIT: 39.8 % (ref 36.0–46.0)
Hemoglobin: 13.4 g/dL (ref 12.0–15.0)
LYMPHS ABS: 1.7 10*3/uL (ref 0.7–4.0)
LYMPHS PCT: 36 % (ref 12.0–46.0)
MCHC: 33.7 g/dL (ref 30.0–36.0)
MCV: 90.9 fl (ref 78.0–100.0)
MONOS PCT: 7.8 % (ref 3.0–12.0)
Monocytes Absolute: 0.4 10*3/uL (ref 0.1–1.0)
NEUTROS PCT: 51.6 % (ref 43.0–77.0)
Neutro Abs: 2.4 10*3/uL (ref 1.4–7.7)
Platelets: 211 10*3/uL (ref 150.0–400.0)
RBC: 4.38 Mil/uL (ref 3.87–5.11)
RDW: 13.2 % (ref 11.5–15.5)
WBC: 4.6 10*3/uL (ref 4.0–10.5)

## 2017-07-15 LAB — LIPID PANEL
CHOL/HDL RATIO: 4
Cholesterol: 245 mg/dL — ABNORMAL HIGH (ref 0–200)
HDL: 64.1 mg/dL (ref >39.00)
LDL CALC: 169 mg/dL — AB (ref 0–99)
NonHDL: 181.23
TRIGLYCERIDES: 59 mg/dL (ref 0.0–149.0)
VLDL: 11.8 mg/dL (ref 0.0–40.0)

## 2017-07-15 LAB — TSH: TSH: 2.78 u[IU]/mL (ref 0.35–4.50)

## 2017-07-19 ENCOUNTER — Other Ambulatory Visit: Payer: Medicare Other

## 2017-07-19 ENCOUNTER — Telehealth: Payer: Self-pay | Admitting: Internal Medicine

## 2017-07-19 ENCOUNTER — Ambulatory Visit (INDEPENDENT_AMBULATORY_CARE_PROVIDER_SITE_OTHER): Payer: Medicare Other | Admitting: Internal Medicine

## 2017-07-19 ENCOUNTER — Encounter: Payer: Self-pay | Admitting: Internal Medicine

## 2017-07-19 VITALS — BP 128/82 | HR 71 | Temp 97.8°F | Resp 16 | Ht 59.84 in | Wt 173.8 lb

## 2017-07-19 DIAGNOSIS — G250 Essential tremor: Secondary | ICD-10-CM | POA: Diagnosis not present

## 2017-07-19 DIAGNOSIS — R413 Other amnesia: Secondary | ICD-10-CM | POA: Diagnosis not present

## 2017-07-19 DIAGNOSIS — E785 Hyperlipidemia, unspecified: Secondary | ICD-10-CM | POA: Diagnosis not present

## 2017-07-19 DIAGNOSIS — I10 Essential (primary) hypertension: Secondary | ICD-10-CM

## 2017-07-19 DIAGNOSIS — Z0000 Encounter for general adult medical examination without abnormal findings: Secondary | ICD-10-CM

## 2017-07-19 DIAGNOSIS — Z23 Encounter for immunization: Secondary | ICD-10-CM

## 2017-07-19 MED ORDER — MUPIROCIN 2 % EX OINT: TOPICAL_OINTMENT | CUTANEOUS | 0 refills | Status: DC

## 2017-07-19 NOTE — Progress Notes (Signed)
Patient ID: Cheryl Winters, female   DOB: August 28, 1934, 83 y.o.   MRN: [id number]   Subjective:    Patient ID: Cheryl Winters, female    DOB: [birth year], 83 y.o.   MRN: [id number]  HPI  Patient here for her physical exam.  She is accompanied by her daughter Asher Muir).  History obtained from both of them.  She saw neurology. Note reviewed.  Was started on aricept.  Was also given remeron.  Daughter reports with the  of remeron, she couldn't get up the next morning.  She has been on aricept for three weeks.  Daughter was questioning if it was making her not as "spunky" the next morning.  States she forgot to take the medication recently and she thought she was much more alert and improved.  Neurology also recommended stopping crestor and propranolol.  The patient reports she is doing relatively well. No chest pain.  No sob.  No acid reflux. No abdominal pain.  Bowels moving.  She reports she worries and gets anxious.  They feel her memory is affected by anxiety.     Past Medical History:  Diagnosis Date  . Abdominal pain, right upper quadrant   . Abnormal stress test    w/fixed defect  . Benign essential tremor   . Carpal tunnel syndrome   . DJD (degenerative joint disease) of knee   . GERD (gastroesophageal reflux disease)   . HTN (hypertension)   . Hyperlipidemia   . Syncope and collapse   . Ulnar nerve lesion   . UTI (urinary tract infection)   . Vitamin D deficiency    Past Surgical History:  Procedure Laterality Date  . Bilateral Bletharoplastices    . BREAST BIOPSY  1957 and 1962   x 2, Nml per pt  . BREAST CYST EXCISION     x 2 - nml path per pt  . CARPAL TUNNEL RELEASE  2004  . CATARACT EXTRACTION    . TOTAL KNEE ARTHROPLASTY     bilateral, total, Marcy Panning  . TOTAL SHOULDER REPLACEMENT  08/2010   left  . TUBAL LIGATION    . UMBILICAL HERNIA REPAIR    . VAGINAL DELIVERY     1 set Twins/2 single births   Family History  Problem Relation Age of Onset  .  Hypertension Mother   . Arthritis Mother   . Stroke Mother   . Dementia Mother   . Early death Father 57       from brain cancer  . Brain cancer Father 17  . Breast cancer Maternal Aunt   . Alcohol abuse Other    Social History   Social History  . Marital status: Widowed    Spouse name: N/A  . Number of children: 4  . Years of education: N/A   Occupational History  . Retired - Biomedical scientist    Social History Main Topics  . Smoking status: Never Smoker  . Smokeless tobacco: Never Used  . Alcohol use No  . Drug use: No  . Sexual activity: Not Currently   Other Topics Concern  . None   Social History Narrative   Lives alone in [place]. Widow. Has children who live nearby.    Outpatient Encounter Prescriptions as of 07/19/2017  Medication Sig  . Biotin 5000 MCG TABS Take by mouth daily.  Marland Kitchen donepezil (ARICEPT) 5 MG tablet Take 5 mg by mouth at bedtime.  . hydrochlorothiazide (HYDRODIURIL) 25 MG tablet Take 1 tablet (25 mg total)  by mouth daily.  Marland Kitchen lisinopril (PRINIVIL,ZESTRIL) 40 MG tablet Take 1 tablet (40 mg total) by mouth daily.  . Multiple Vitamins-Minerals (MULTIVITAMIN WITH MINERALS) tablet Take 1 tablet by mouth daily.  Marland Kitchen Ubiquinol 200 MG CAPS Take by mouth.  . mupirocin ointment (BACTROBAN) 2 % Apply to affected area on leg bid  . [DISCONTINUED] rosuvastatin (CRESTOR) 10 MG tablet TAKE 1 TABLET BY MOUTH EVERY DAY   No facility-administered encounter medications on file as of 07/19/2017.     Review of Systems  Constitutional: Negative for appetite change and unexpected weight change.  HENT: Negative for congestion and sinus pressure.   Respiratory: Negative for cough, chest tightness and shortness of breath.   Cardiovascular: Negative for chest pain, palpitations and leg swelling.  Gastrointestinal: Negative for abdominal pain, diarrhea, nausea and vomiting.  Genitourinary: Negative for difficulty urinating and dysuria.  Musculoskeletal: Negative for  joint swelling and myalgias.  Skin: Negative for color change and rash.  Neurological: Negative for dizziness and headaches.  Psychiatric/Behavioral: Negative for agitation and dysphoric mood.       Increased anxiety as outlined.         Objective:     Blood pressure rechecked by me:  128/82  Physical Exam  Constitutional: She is oriented to person, place, and time. She appears well-developed and well-nourished. No distress.  HENT:  Nose: Nose normal.  Mouth/Throat: Oropharynx is clear and moist.  Eyes: Right eye exhibits no discharge. Left eye exhibits no discharge. No scleral icterus.  Neck: Neck supple. No thyromegaly present.  Cardiovascular: Normal rate and regular rhythm.   Pulmonary/Chest: Breath sounds normal. No accessory muscle usage. No tachypnea. No respiratory distress. She has no decreased breath sounds. She has no wheezes. She has no rhonchi. Right breast exhibits no inverted nipple, no mass, no nipple discharge and no tenderness (no axillary adenopathy). Left breast exhibits no inverted nipple, no mass, no nipple discharge and no tenderness (no axilarry adenopathy).  Abdominal: Soft. Bowel sounds are normal. There is no tenderness.  Musculoskeletal: She exhibits no edema or tenderness.  Lymphadenopathy:    She has no cervical adenopathy.  Neurological: She is alert and oriented to person, place, and time.  Skin: Skin is warm. No rash noted. No erythema.  Psychiatric: She has a normal mood and affect. Her behavior is normal.    BP 128/82   Pulse 71   Temp 97.8 F (36.6 C) (Oral)   Resp 16   Ht 4' 11.84" (1.52 m)   Wt 173 lb 12.8 oz (78.8 kg)   SpO2 94%   BMI 34.12 kg/m  Wt Readings from Last 3 Encounters:  07/19/17 173 lb 12.8 oz (78.8 kg)  04/14/17 176 lb 12.8 oz (80.2 kg)  12/13/16 180 lb 3.2 oz (81.7 kg)     Lab Results  Component Value Date   WBC 4.6 07/15/2017   HGB 13.4 07/15/2017   HCT 39.8 07/15/2017   PLT 211.0 07/15/2017   GLUCOSE 90  07/15/2017   CHOL 245 (H) 07/15/2017   TRIG 59.0 07/15/2017   HDL 64.10 07/15/2017   LDLDIRECT 123.9 08/27/2013   LDLCALC 169 (H) 07/15/2017   ALT 13 07/15/2017   AST 16 07/15/2017   NA 139 07/15/2017   K 3.8 07/15/2017   CL 102 07/15/2017   CREATININE 0.66 07/15/2017   BUN 18 07/15/2017   CO2 30 07/15/2017   TSH 2.78 07/15/2017   INR 1.0 10/07/2014   MICROALBUR 0.3 09/02/2014    Mm Digital Screening  Bilateral  Result Date: 04/27/2017 CLINICAL DATA:  Screening. EXAM: DIGITAL SCREENING BILATERAL MAMMOGRAM WITH CAD COMPARISON:  Previous exam(s). ACR Breast Density Category c: The breast tissue is heterogeneously dense, which may obscure small masses. FINDINGS: There are no findings suspicious for malignancy. Images were processed with CAD. IMPRESSION: No mammographic evidence of malignancy. A result letter of this screening mammogram will be mailed directly to the patient. RECOMMENDATION: Screening mammogram in one year. (Code:SM-B-01Y) BI-RADS CATEGORY  1: Negative. Electronically Signed   By: Dalphine Handing M.D.   On: 04/27/2017 07:54       Assessment & Plan:   Problem List Items Addressed This Visit    Health care maintenance    Physical today 07/19/17.  Mammogram 04/27/17 - Birads I.        Hyperlipidemia    Had started her on crestor. She does not want to take.  Neurology instructed her to stop.  Follow.        Hypertension    Blood pressure on recheck improved.  Continue same medication regimen.  Follow pressures.  Follow metabolic panel.        Memory change    Saw neurology.  Note reviewed.  Started on aricept.  Daughter feels pt is more "dulled" on the medication.  Will stop and see if symptoms improve.  Follow.  Continue f/u with neurology.        Tremor, essential    Has been evaluated by neurology.  Off propranolol.  Did not feel helped.        Other Visit Diagnoses    Encounter for immunization       Relevant Orders   Flu vaccine HIGH DOSE PF (Completed)        [place], MD

## 2017-07-19 NOTE — Telephone Encounter (Signed)
Pt daughter called about the cream for wound on lower left leg that was supposed to have been given to them before they left the office today. Please advise?  Call daughter @ [phone]. Thank you!

## 2017-07-19 NOTE — Telephone Encounter (Signed)
Called patient let her know that the script was sent to pharmacy.

## 2017-07-19 NOTE — Assessment & Plan Note (Signed)
Physical today 07/19/17.  Mammogram 04/27/17 - Birads I.

## 2017-07-22 ENCOUNTER — Encounter: Payer: Self-pay | Admitting: Internal Medicine

## 2017-07-22 NOTE — Assessment & Plan Note (Signed)
Saw neurology.  Note reviewed.  Started on aricept.  Daughter feels pt is more "dulled" on the medication.  Will stop and see if symptoms improve.  Follow.  Continue f/u with neurology.

## 2017-07-22 NOTE — Assessment & Plan Note (Signed)
Blood pressure on recheck improved.  Continue same medication regimen.  Follow pressures.  Follow metabolic panel.   

## 2017-07-22 NOTE — Assessment & Plan Note (Signed)
Has been evaluated by neurology.  Off propranolol.  Did not feel helped.

## 2017-07-22 NOTE — Assessment & Plan Note (Signed)
Had started her on crestor. She does not want to take.  Neurology instructed her to stop.  Follow.

## 2017-08-17 ENCOUNTER — Telehealth: Payer: Self-pay | Admitting: Internal Medicine

## 2017-08-17 NOTE — Telephone Encounter (Signed)
Pt called about feeling uncomfortable in her chest and head. No left arm pain, quizzy started last night. Pt was transferred to [facility]. Thank you!

## 2017-08-17 NOTE — Telephone Encounter (Signed)
Advised patient to go to urgent care, patient stated she would if she did not start feeling better after this afternoon. Will follow up this afternoon.

## 2017-08-17 NOTE — Telephone Encounter (Signed)
[facility] [facility] - [place] TELEPHONE ADVICE RECORD [facility] Patient Name: Cheryl Winters DOB: April 06, 1934 Initial Comment Pt [name] is having chest discomfort when taking a deep breath and nauseous. Nurse Assessment Nurse: Lane HackerHarley, RN, Elvin SoWindy Date/Time Lamount Cohen(Eastern Time): 08/17/2017 11:07:28 AM Confirm and document reason for call. If symptomatic, describe symptoms. ---Caller c/o runny nose, nausea that started yesterday and mild chest discomfort/pain noticed when taking a deep breathe. Kept her from sleeping last night. No vomiting, cough, or fever. Does the patient have any new or worsening symptoms? ---Yes Will a triage be completed? ---Yes Related visit to physician within the last 2 weeks? ---No Does the PT have any chronic conditions? (i.e. diabetes, asthma, etc.) ---No Is this a behavioral health or substance abuse call? ---No Guidelines Guideline Title Affirmed Question Affirmed Notes Chest Pain Taking a deep breath makes pain worse Final Disposition User Go to ED Now (or PCP triage) Lane HackerHarley, RN, Windy Comments [facility], no appts. Referrals [facility] - ED Caller Disagree/Comply Comply Caller Understands Yes PreDisposition Go to Urgent Care/Walk-In Clinic

## 2017-08-17 NOTE — Telephone Encounter (Signed)
Think that you talked to her about this patient

## 2017-08-18 NOTE — Telephone Encounter (Signed)
Called and l/m to for patient at home as well as called an l/m with daughter.

## 2017-08-18 NOTE — Telephone Encounter (Signed)
Can you do this referral or do I need to do something?

## 2017-08-18 NOTE — Telephone Encounter (Signed)
Patients daughter called back states that she was unaware of symptoms. She will call mom and if she is having any sx she will take to ED or walk in. They are concerned with her every day living. Wanted to know if [facility] would be option for her. Informed that I would get your advise regarding referral to them.

## 2017-08-18 NOTE — Telephone Encounter (Signed)
I think [facility] is a good idea.  Let me know what we need to do to get this scheduled.

## 2017-08-18 NOTE — Telephone Encounter (Signed)
May need to talk with her daughter.  Since had issues and if having any chest pressure, sob or any acute symptoms, needs to be evaluated.

## 2017-08-18 NOTE — Telephone Encounter (Signed)
Called patient states that she stayed around the house for a little bit yesterday and started feeling better so she did not go. She states she feels better today she is still felling a little groggy. But she did not sleep well last night.

## 2017-08-18 NOTE — Telephone Encounter (Signed)
Out of office 08/17/17.  Reviewed message.  Please call and confirm pt was evaluated.  If not, needs to be seen.

## 2017-08-19 ENCOUNTER — Telehealth: Payer: Self-pay

## 2017-08-19 DIAGNOSIS — L989 Disorder of the skin and subcutaneous tissue, unspecified: Secondary | ICD-10-CM

## 2017-08-19 NOTE — Telephone Encounter (Signed)
Copied from CRM [phone]. Topic: Quick Communication - See Telephone Encounter >> Aug 19, 2017  3:34 PM Anice PaganiniMunoz, Cruz I, NT wrote: CRM for notification. See Telephone encounter for:  08/19/17.return phone call please call her back at her home phone thanks

## 2017-08-19 NOTE — Telephone Encounter (Signed)
Noted.  Thank you for the f/u.  She does need to be seen.

## 2017-08-19 NOTE — Telephone Encounter (Signed)
Agree with need for evaluation.  If she is having leg swelling, may need lower extremity ultrasound.  See previous message, was concerned regarding sob.  No sob on last visit or currently (per note).  Definitely agree with evaluation now.  Please f/u and confirm was evaluated.  Thanks

## 2017-08-19 NOTE — Telephone Encounter (Signed)
If her leg is truly more swollen she needs to have it evaluated.

## 2017-08-19 NOTE — Telephone Encounter (Signed)
Patient came back in she, I reviewed chart she did go to [facility] today.  They referred her to dermatology for her left leg.   Patient states her lower leg is more swollen and it appears more red and painful.   She denies shortness of breath or chest pain she is very shakey and jittery    She was advised by Marland KitchenJo Ellen and I that she needed to go have her left  leg evaluated at ER for further work up to ensure that nothing more was going on with it.   Patient agreed. She wanted to call her daughter Almira CoasterGina.  She called her daughter  While in the office Almira CoasterGina and I informed her .  I explained her to why she needed to go to ER for further evaluation to make sure it wasn't DVT. She also seemed very frustrated due to urgent care didn't treat her mom .     She explained to me that her mother didn't need to be driving at night due to her dementia.   She also explained that she also exlplained to me that she just had surgery.   She would contact her other sister Jayme.  I called Jayme and explained to her that her mother was going to ER for work up.  She explained to me her mothers left leg redness and swelling had been ongoing.  I explained to her based on her symptoms she described to me and upon reviewing previous documentation form 08/17/17 patient needed to be evaluated due to chest pain and shortness of breath.  Upon the patient leaving she stated that was going home.  I again encouraged her to go ED for evaluation .

## 2017-08-19 NOTE — Telephone Encounter (Signed)
The referral to [facility] has been faxed.

## 2017-08-19 NOTE — Telephone Encounter (Signed)
Please advise, thanks.

## 2017-08-19 NOTE — Telephone Encounter (Signed)
Called and followed up with patient and couldn't get clear understanding if she went to urgent care or not .   She stated she had been using mupirocin ointment on her leg.   I again explained the importance of her going to urgent care for evaluation of leg due to leg swelling and tightness .  I explained to her saw previous notes in her charts where she had complained of shortness of breath and chest discomfort and she denied this at present time.   Patient seemed very upset and was crying from ready all the papers from the doctors .   She then told me she saw a man at the doctors.   I called daughter Rory PercyJayme and advised her that patient needed to be evaluated today.  She will contact her brother in law to see if her can they can get her evaluated.

## 2017-08-19 NOTE — Telephone Encounter (Signed)
Patient went to [facility] as advised for a walk in to get her leg scanned today and they stated that her leg did not need to be scanned. Pt is now saying the swelling hasn't gotten any better. Her daughter states that she has had consistent swelling for a few months. Patient would like to know if she needs to be seen in office again or if the office can send a referral or order to [facility] to have her leg scanned if needed? The [facility] at [facility] stated she needed to see a dermatologist. Please contact Grayling CongressJayme Williams, pts daughter at [phone].

## 2017-08-19 NOTE — Telephone Encounter (Signed)
Reason for call:sore on left lower calf  Symptoms: sore on left lower leg protruding down leg , upper  part of leg swollen tight , painful to touch, no history of insect bites , denied chest pain, shortness of breath Duration 5-6 days  Medications: otc cream advised by pharmacist similar to neosporin  Last seen for this problem: n/a Seen by:n/a Patient agreed to go to [facility] for evaluation  Spoke with patients daughter and sore has been present longer than for over month has been applying mupirocin cream given by Dr Lorin PicketScott.  Spoke with daughter per DPR and she will follow up with patient to make sure she goes to urgent care .  She will follow up with me this pm .

## 2017-08-19 NOTE — Telephone Encounter (Signed)
Think this should have went to you.  

## 2017-08-20 NOTE — Telephone Encounter (Signed)
Order placed for dermatology referral.  Pt aware.

## 2017-08-20 NOTE — Telephone Encounter (Signed)
Reviewed notes.  Called Ms Cheryl Winters and Cheryl Winters.  (Ms Cheryl Winters informed me to call Cheryl Winters stating she would be home and that Cheryl Winters is not at home).  Discussed visits today.  Received ok from Ms Cheryl Winters and Cheryl Winters to evaluate her leg.  I evaluated her leg and she does have lesion on leg that has some surrounding skin change, but no evidence of infection.  No sob. No chest pain.  No increased swelling up her leg.  No pain in her calf.  Will have dermatology evaluate given persistent lesion.  Pt in agreement and very comfortable with this plan.

## 2017-09-07 ENCOUNTER — Ambulatory Visit: Payer: Medicare Other | Admitting: Internal Medicine

## 2017-09-13 ENCOUNTER — Other Ambulatory Visit: Payer: Self-pay | Admitting: Internal Medicine

## 2017-10-24 ENCOUNTER — Ambulatory Visit: Payer: Medicare Other | Admitting: Internal Medicine

## 2017-10-25 ENCOUNTER — Ambulatory Visit: Payer: Medicare Other

## 2017-11-01 ENCOUNTER — Emergency Department: Payer: Medicare Other

## 2017-11-01 ENCOUNTER — Ambulatory Visit (INDEPENDENT_AMBULATORY_CARE_PROVIDER_SITE_OTHER): Payer: Medicare Other | Admitting: Internal Medicine

## 2017-11-01 ENCOUNTER — Ambulatory Visit (INDEPENDENT_AMBULATORY_CARE_PROVIDER_SITE_OTHER): Payer: Medicare Other

## 2017-11-01 ENCOUNTER — Inpatient Hospital Stay
Admission: EM | Admit: 2017-11-01 | Discharge: 2017-11-04 | DRG: 308 | Disposition: A | Discharge status: Home Health | Payer: Medicare Other | Attending: Internal Medicine | Admitting: Internal Medicine

## 2017-11-01 ENCOUNTER — Encounter: Payer: Self-pay | Admitting: Internal Medicine

## 2017-11-01 ENCOUNTER — Encounter: Payer: Self-pay | Admitting: Medical Oncology

## 2017-11-01 ENCOUNTER — Other Ambulatory Visit: Payer: Self-pay

## 2017-11-01 VITALS — BP 110/70 | HR 50 | Temp 97.9°F | Resp 15 | Ht 59.0 in | Wt 169.8 lb

## 2017-11-01 VITALS — BP 110/70 | HR 50 | Temp 97.9°F | Resp 15 | Ht 59.84 in | Wt 169.8 lb

## 2017-11-01 DIAGNOSIS — G250 Essential tremor: Secondary | ICD-10-CM | POA: Diagnosis present

## 2017-11-01 DIAGNOSIS — R402413 Glasgow coma scale score 13-15, at hospital admission: Secondary | ICD-10-CM | POA: Diagnosis present

## 2017-11-01 DIAGNOSIS — E8770 Fluid overload, unspecified: Secondary | ICD-10-CM | POA: Diagnosis present

## 2017-11-01 DIAGNOSIS — N170 Acute kidney failure with tubular necrosis: Secondary | ICD-10-CM | POA: Diagnosis present

## 2017-11-01 DIAGNOSIS — Z9114 Patient's other noncompliance with medication regimen: Secondary | ICD-10-CM

## 2017-11-01 DIAGNOSIS — F0150 Vascular dementia without behavioral disturbance: Secondary | ICD-10-CM | POA: Diagnosis present

## 2017-11-01 DIAGNOSIS — I517 Cardiomegaly: Secondary | ICD-10-CM | POA: Diagnosis present

## 2017-11-01 DIAGNOSIS — R0683 Snoring: Secondary | ICD-10-CM | POA: Diagnosis present

## 2017-11-01 DIAGNOSIS — Z1331 Encounter for screening for depression: Secondary | ICD-10-CM

## 2017-11-01 DIAGNOSIS — I10 Essential (primary) hypertension: Secondary | ICD-10-CM | POA: Diagnosis not present

## 2017-11-01 DIAGNOSIS — I4891 Unspecified atrial fibrillation: Secondary | ICD-10-CM

## 2017-11-01 DIAGNOSIS — F0280 Dementia in other diseases classified elsewhere without behavioral disturbance: Secondary | ICD-10-CM | POA: Diagnosis not present

## 2017-11-01 DIAGNOSIS — K219 Gastro-esophageal reflux disease without esophagitis: Secondary | ICD-10-CM | POA: Diagnosis present

## 2017-11-01 DIAGNOSIS — Z9181 History of falling: Secondary | ICD-10-CM

## 2017-11-01 DIAGNOSIS — J90 Pleural effusion, not elsewhere classified: Secondary | ICD-10-CM | POA: Diagnosis not present

## 2017-11-01 DIAGNOSIS — E559 Vitamin D deficiency, unspecified: Secondary | ICD-10-CM | POA: Diagnosis present

## 2017-11-01 DIAGNOSIS — Z79899 Other long term (current) drug therapy: Secondary | ICD-10-CM

## 2017-11-01 DIAGNOSIS — R42 Dizziness and giddiness: Secondary | ICD-10-CM | POA: Diagnosis not present

## 2017-11-01 DIAGNOSIS — Z888 Allergy status to other drugs, medicaments and biological substances status: Secondary | ICD-10-CM

## 2017-11-01 DIAGNOSIS — G5600 Carpal tunnel syndrome, unspecified upper limb: Secondary | ICD-10-CM | POA: Diagnosis present

## 2017-11-01 DIAGNOSIS — I952 Hypotension due to drugs: Secondary | ICD-10-CM | POA: Diagnosis present

## 2017-11-01 DIAGNOSIS — I420 Dilated cardiomyopathy: Secondary | ICD-10-CM | POA: Diagnosis not present

## 2017-11-01 DIAGNOSIS — J9811 Atelectasis: Secondary | ICD-10-CM | POA: Diagnosis present

## 2017-11-01 DIAGNOSIS — Z880 Allergy status to penicillin: Secondary | ICD-10-CM

## 2017-11-01 DIAGNOSIS — G309 Alzheimer's disease, unspecified: Secondary | ICD-10-CM | POA: Diagnosis not present

## 2017-11-01 DIAGNOSIS — I248 Other forms of acute ischemic heart disease: Secondary | ICD-10-CM | POA: Diagnosis present

## 2017-11-01 DIAGNOSIS — I482 Chronic atrial fibrillation, unspecified: Secondary | ICD-10-CM | POA: Insufficient documentation

## 2017-11-01 DIAGNOSIS — Z7982 Long term (current) use of aspirin: Secondary | ICD-10-CM

## 2017-11-01 DIAGNOSIS — F0390 Unspecified dementia without behavioral disturbance: Secondary | ICD-10-CM

## 2017-11-01 DIAGNOSIS — I499 Cardiac arrhythmia, unspecified: Secondary | ICD-10-CM

## 2017-11-01 DIAGNOSIS — I959 Hypotension, unspecified: Secondary | ICD-10-CM | POA: Diagnosis not present

## 2017-11-01 DIAGNOSIS — Z96653 Presence of artificial knee joint, bilateral: Secondary | ICD-10-CM | POA: Diagnosis present

## 2017-11-01 DIAGNOSIS — T447X5A Adverse effect of beta-adrenoreceptor antagonists, initial encounter: Secondary | ICD-10-CM | POA: Diagnosis present

## 2017-11-01 DIAGNOSIS — R2689 Other abnormalities of gait and mobility: Secondary | ICD-10-CM | POA: Diagnosis present

## 2017-11-01 DIAGNOSIS — T461X5A Adverse effect of calcium-channel blockers, initial encounter: Secondary | ICD-10-CM | POA: Diagnosis present

## 2017-11-01 DIAGNOSIS — I119 Hypertensive heart disease without heart failure: Secondary | ICD-10-CM | POA: Diagnosis present

## 2017-11-01 DIAGNOSIS — R001 Bradycardia, unspecified: Secondary | ICD-10-CM | POA: Diagnosis present

## 2017-11-01 DIAGNOSIS — Z96612 Presence of left artificial shoulder joint: Secondary | ICD-10-CM | POA: Diagnosis present

## 2017-11-01 DIAGNOSIS — M1710 Unilateral primary osteoarthritis, unspecified knee: Secondary | ICD-10-CM | POA: Diagnosis present

## 2017-11-01 DIAGNOSIS — Z885 Allergy status to narcotic agent status: Secondary | ICD-10-CM

## 2017-11-01 DIAGNOSIS — Z0000 Encounter for general adult medical examination without abnormal findings: Secondary | ICD-10-CM | POA: Diagnosis not present

## 2017-11-01 LAB — CBC
HCT: 41.2 % (ref 35.0–47.0)
Hemoglobin: 13.5 g/dL (ref 12.0–16.0)
MCH: 29 pg (ref 26.0–34.0)
MCHC: 32.8 g/dL (ref 32.0–36.0)
MCV: 88.3 fL (ref 80.0–100.0)
PLATELETS: 210 10*3/uL (ref 150–440)
RBC: 4.67 MIL/uL (ref 3.80–5.20)
RDW: 14.1 % (ref 11.5–14.5)
WBC: 5.4 10*3/uL (ref 3.6–11.0)

## 2017-11-01 LAB — BASIC METABOLIC PANEL
Anion gap: 12 (ref 5–15)
BUN: 19 mg/dL (ref 6–20)
CALCIUM: 10 mg/dL (ref 8.9–10.3)
CHLORIDE: 102 mmol/L (ref 101–111)
CO2: 27 mmol/L (ref 22–32)
CREATININE: 0.75 mg/dL (ref 0.44–1.00)
GFR calc non Af Amer: >60 mL/min (ref >60)
Glucose, Bld: 107 mg/dL — ABNORMAL HIGH (ref 65–99)
Potassium: 3.6 mmol/L (ref 3.5–5.1)
Sodium: 141 mmol/L (ref 135–145)

## 2017-11-01 LAB — BRAIN NATRIURETIC PEPTIDE: B Natriuretic Peptide: 366 pg/mL — ABNORMAL HIGH (ref 0.0–100.0)

## 2017-11-01 LAB — TROPONIN I
TROPONIN I: 0.03 ng/mL — AB (ref <0.03)
Troponin I: <0.03 ng/mL (ref <0.03)

## 2017-11-01 LAB — MRSA PCR SCREENING: MRSA by PCR: NEGATIVE

## 2017-11-01 LAB — GLUCOSE, CAPILLARY: Glucose-Capillary: 114 mg/dL — ABNORMAL HIGH (ref 65–99)

## 2017-11-01 LAB — MAGNESIUM: MAGNESIUM: 1.8 mg/dL (ref 1.7–2.4)

## 2017-11-01 MED ORDER — ONDANSETRON HCL 4 MG PO TABS
4.0000 mg | ORAL_TABLET | Freq: Four times a day (QID) | ORAL | Status: DC | PRN
  Administered 2017-11-02: 4 mg via ORAL
  Filled 2017-11-01: qty 1

## 2017-11-01 MED ORDER — DILTIAZEM HCL 100 MG IV SOLR: 5.0000 mg/h | Freq: Once | INTRAVENOUS | Status: DC

## 2017-11-01 MED ORDER — BISACODYL 5 MG PO TBEC: 5.0000 mg | DELAYED_RELEASE_TABLET | Freq: Every day | ORAL | Status: DC | PRN

## 2017-11-01 MED ORDER — SODIUM CHLORIDE 0.9 % IV SOLN: 250.0000 mL | INTRAVENOUS | Status: DC | PRN

## 2017-11-01 MED ORDER — ONDANSETRON HCL 4 MG/2ML IJ SOLN
4.0000 mg | Freq: Four times a day (QID) | INTRAMUSCULAR | Status: DC | PRN
  Administered 2017-11-01: 4 mg via INTRAVENOUS
  Filled 2017-11-01: qty 2

## 2017-11-01 MED ORDER — DILTIAZEM HCL 100 MG IV SOLR
5.0000 mg/h | INTRAVENOUS | Status: DC
  Administered 2017-11-01: 15 mg/h via INTRAVENOUS
  Filled 2017-11-01 (×2): qty 100

## 2017-11-01 MED ORDER — METOPROLOL TARTRATE 25 MG PO TABS
25.0000 mg | ORAL_TABLET | Freq: Two times a day (BID) | ORAL | Status: DC
  Administered 2017-11-01 – 2017-11-02 (×2): 25 mg via ORAL
  Filled 2017-11-01 (×2): qty 1

## 2017-11-01 MED ORDER — POTASSIUM CHLORIDE CRYS ER 20 MEQ PO TBCR
40.0000 meq | EXTENDED_RELEASE_TABLET | Freq: Once | ORAL | Status: AC
  Administered 2017-11-01: 40 meq via ORAL
  Filled 2017-11-01: qty 2

## 2017-11-01 MED ORDER — DILTIAZEM HCL 100 MG IV SOLR
5.0000 mg/h | Freq: Once | INTRAVENOUS | Status: AC
  Administered 2017-11-01: 5 mg/h via INTRAVENOUS
  Filled 2017-11-01: qty 100

## 2017-11-01 MED ORDER — ACETAMINOPHEN 650 MG RE SUPP: 650.0000 mg | Freq: Four times a day (QID) | RECTAL | Status: DC | PRN

## 2017-11-01 MED ORDER — PANTOPRAZOLE SODIUM 40 MG PO TBEC
40.0000 mg | DELAYED_RELEASE_TABLET | Freq: Every day | ORAL | Status: DC
  Administered 2017-11-01: 40 mg via ORAL
  Filled 2017-11-01: qty 1

## 2017-11-01 MED ORDER — ACETAMINOPHEN 325 MG PO TABS: 650.0000 mg | ORAL_TABLET | Freq: Four times a day (QID) | ORAL | Status: DC | PRN

## 2017-11-01 MED ORDER — LISINOPRIL 20 MG PO TABS
40.0000 mg | ORAL_TABLET | Freq: Every day | ORAL | Status: DC
  Administered 2017-11-01 – 2017-11-02 (×2): 40 mg via ORAL
  Filled 2017-11-01 (×2): qty 2

## 2017-11-01 MED ORDER — ENOXAPARIN SODIUM 80 MG/0.8ML ~~LOC~~ SOLN
1.0000 mg/kg | Freq: Two times a day (BID) | SUBCUTANEOUS | Status: DC
  Administered 2017-11-01 – 2017-11-03 (×5): 75 mg via SUBCUTANEOUS
  Filled 2017-11-01 (×5): qty 0.8

## 2017-11-01 MED ORDER — ALBUTEROL SULFATE (2.5 MG/3ML) 0.083% IN NEBU: 2.5000 mg | INHALATION_SOLUTION | RESPIRATORY_TRACT | Status: DC | PRN

## 2017-11-01 MED ORDER — DILTIAZEM HCL 25 MG/5ML IV SOLN
10.0000 mg | Freq: Once | INTRAVENOUS | Status: AC
  Administered 2017-11-01: 10 mg via INTRAVENOUS

## 2017-11-01 MED ORDER — ASPIRIN EC 81 MG PO TBEC
81.0000 mg | DELAYED_RELEASE_TABLET | Freq: Every day | ORAL | Status: DC
  Administered 2017-11-01 – 2017-11-04 (×4): 81 mg via ORAL
  Filled 2017-11-01 (×4): qty 1

## 2017-11-01 MED ORDER — DILTIAZEM HCL 25 MG/5ML IV SOLN
10.0000 mg | Freq: Once | INTRAVENOUS | Status: AC
  Administered 2017-11-01: 10 mg via INTRAVENOUS
  Filled 2017-11-01: qty 5

## 2017-11-01 MED ORDER — SODIUM CHLORIDE 0.9% FLUSH
3.0000 mL | Freq: Two times a day (BID) | INTRAVENOUS | Status: DC
  Administered 2017-11-02 – 2017-11-04 (×4): 3 mL via INTRAVENOUS

## 2017-11-01 MED ORDER — ALUM & MAG HYDROXIDE-SIMETH 200-200-20 MG/5ML PO SUSP
30.0000 mL | Freq: Four times a day (QID) | ORAL | Status: DC | PRN
  Administered 2017-11-01: 30 mL via ORAL
  Filled 2017-11-01 (×2): qty 30

## 2017-11-01 MED ORDER — SODIUM CHLORIDE 0.9% FLUSH
3.0000 mL | INTRAVENOUS | Status: DC | PRN
  Administered 2017-11-01: 3 mL via INTRAVENOUS
  Filled 2017-11-01: qty 3

## 2017-11-01 MED ORDER — SENNOSIDES-DOCUSATE SODIUM 8.6-50 MG PO TABS: 1.0000 | ORAL_TABLET | Freq: Every evening | ORAL | Status: DC | PRN

## 2017-11-01 MED ORDER — SODIUM CHLORIDE 0.9 % IV BOLUS (SEPSIS)
250.0000 mL | Freq: Once | INTRAVENOUS | Status: AC
  Administered 2017-11-01: 250 mL via INTRAVENOUS

## 2017-11-01 MED ORDER — HYDROCHLOROTHIAZIDE 25 MG PO TABS
25.0000 mg | ORAL_TABLET | Freq: Every day | ORAL | Status: DC
  Administered 2017-11-01 – 2017-11-02 (×2): 25 mg via ORAL
  Filled 2017-11-01 (×2): qty 1

## 2017-11-01 NOTE — ED Notes (Signed)
EDP to bedside to give pt update on plan of care.

## 2017-11-01 NOTE — ED Notes (Signed)
Hospitalist to bedside at this time 

## 2017-11-01 NOTE — ED Notes (Signed)
Upon reassessment, pt and family very disgruntled, pt has pulled off pulse ox monitor and blood pressure cuff, family states, "No one has been in here in a very long time, she got another dose of medication and is not being monitored.  She has been sitting in here with acid indigestion for a long time and has been giving nothing.  She has not had her daily medications, nor has she had anything to eat or drink."  Apologies made via this RN, pt and family advised of ED processes and associated time lines.    Pt states, "I am ready to leave."  EDP aware.

## 2017-11-01 NOTE — Significant Event (Signed)
Rapid Response Event Note  Overview: Time Called: 1936 Arrival Time: 1937 Event Type: Hypotension  Initial Focused Assessment: Pt in bed having bowel movement. Pt RN states pt became hypotensive after cardizem gtt started. Pt pale alert and oriented.  Interventions: bolus, 12 lead ekg, troponin  Plan of Care (if not transferred): To ICU 8 Event Summary: Name of Physician Notified: Imogene Burnhen at [place]    at    Outcome: Transferred (Comment)     Cheryl Winters A

## 2017-11-01 NOTE — Progress Notes (Signed)
Patient bp drop to the 60's , cardizem drip stop. MD informed , 250 mls bolus given as per md order , bp still in the 70's . RRT call , will continue to monitor

## 2017-11-01 NOTE — ED Notes (Signed)
FN: pt sent over from Dr Marina GoodellScotts office for further eval of new onset AFIB, c/o dizziness and feeling lightheaded.

## 2017-11-01 NOTE — ED Notes (Signed)
Pt provided turkey sandwich tray upon request. 

## 2017-11-01 NOTE — ED Notes (Addendum)
RN into room to check on pt. Pt given medication CARDIZEM and Pt requesting medication for indigestion. Family updated on plan of care at this time. RN notified

## 2017-11-01 NOTE — ED Notes (Addendum)
Pt heart rate continues to fluctuate from 120's-140's.  Hospitalist made aware; advises to continue to titrate Cardizem.  See MAR.

## 2017-11-01 NOTE — Assessment & Plan Note (Signed)
Possible coming from afib.  Worse currently.  To ER as outlined.

## 2017-11-01 NOTE — Plan of Care (Signed)
  Progressing Education: Knowledge of disease or condition will improve 11/01/2017 1754 - Progressing by Tomie ChinaJackson, Malaika Arnall Cecelie, RN Understanding of medication regimen will improve 11/01/2017 1754 - Progressing by Tomie ChinaJackson, Aldean Pipe Cecelie, RN Activity: Ability to tolerate increased activity will improve 11/01/2017 1754 - Progressing by Tomie ChinaJackson, Ashle Stief Cecelie, RN Cardiac: Ability to achieve and maintain adequate cardiopulmonary perfusion will improve 11/01/2017 1754 - Progressing by Tomie ChinaJackson, Nannette Zill Cecelie, RN Health Behavior/Discharge Planning: Ability to safely manage health-related needs after discharge will improve 11/01/2017 1754 - Progressing by Tomie ChinaJackson, Jaquari Reckner Cecelie, RN Education: Knowledge of General Education information will improve 11/01/2017 1754 - Progressing by Tomie ChinaJackson, Greenlee Ancheta Cecelie, RN Health Behavior/Discharge Planning: Ability to manage health-related needs will improve 11/01/2017 1754 - Progressing by Tomie ChinaJackson, Ido Wollman Cecelie, RN Clinical Measurements: Ability to maintain clinical measurements within normal limits will improve 11/01/2017 1754 - Progressing by Tomie ChinaJackson, Burns Timson Cecelie, RN Will remain free from infection 11/01/2017 1754 - Progressing by Tomie ChinaJackson, Deniyah Dillavou Cecelie, RN Diagnostic test results will improve 11/01/2017 1754 - Progressing by Tomie ChinaJackson, Emigdio Wildeman Cecelie, RN Respiratory complications will improve 11/01/2017 1754 - Progressing by Tomie ChinaJackson, Sergey Ishler Cecelie, RN Cardiovascular complication will be avoided 11/01/2017 1754 - Progressing by Tomie ChinaJackson, Maile Linford Cecelie, RN Activity: Risk for activity intolerance will decrease 11/01/2017 1754 - Progressing by Tomie ChinaJackson, Bernie Fobes Cecelie, RN Pain Managment: General experience of comfort will improve 11/01/2017 1754 - Progressing by Tomie ChinaJackson, Lawson Isabell Cecelie, RN Safety: Ability to remain free from injury will improve 11/01/2017 1754 - Progressing by Tomie ChinaJackson, Lavilla Delamora Cecelie, RN

## 2017-11-01 NOTE — Progress Notes (Addendum)
The patient feels nausea, hot and dizzy. Per her daughter, room tem is high at 6585.  HR is down to 86 on cardizem drip. But SBP is down to 68/54.  Cardizem drip is on hold. She is given NS bolus 250 ml, but BP is still low at 72/35.  She is given another 500 ml NS bolus. She is alert and awake, complaint of back pain per RN. Normal breath sounds, EKG shows Afib at 69. F/u troponin. Hypotension. Continue NS bolus, levophed drip prn.  I discussed with her 3 daugters,  RN, ICU NP.  Critical time 42 minutes.

## 2017-11-01 NOTE — Patient Instructions (Addendum)
  Ms. Cheryl Winters , Thank you for taking time to come for your Medicare Wellness Visit. I appreciate your ongoing commitment to your health goals. Please review the following plan we discussed and let me know if I can assist you in the future.   Follow up with Dr. Lorin PicketScott as needed.    Bring a copy of your Health Care Power of Attorney and/or Living Will to be scanned into chart.  Have a great day!  These are the goals we discussed: Goals    . Increase water intake     Stay hydrated and drink plenty of water        This is a list of the screening recommended for you and due dates:  Health Maintenance  Topic Date Due  . Tetanus Vaccine  10/04/2022  . Flu Shot  Completed  . DEXA scan (bone density measurement)  Completed  . Pneumonia vaccines  Completed

## 2017-11-01 NOTE — ED Triage Notes (Signed)
Pt here from Dr office with c/o new afib with RVR and complaints of lightheadedness. Pt appears in no distress, denies pain, states "I just don't feel normal."

## 2017-11-01 NOTE — Progress Notes (Signed)
Verbal order from Dr. Imogene Burnhen to transfer pt to ICU, room 8, due to symptomatic hypotension, s/p being on cardizem gtt. Family at bedside notified and chaplain to escort family to ICU waiting room. Bedside report given to Josh, ICU RN assuming care. Shirley FriarAlexis Miller, RN, BSN

## 2017-11-01 NOTE — ED Provider Notes (Signed)
[facility] Emergency Department Provider Note ____________________________________________   I have reviewed the triage vital signs and the triage nursing note.  HISTORY  Chief Complaint Irregular Heart Beat   Historian Patient and daughter  HPI Cheryl Winters is a 84 y.o. female with hypertensionpresented to her primary care doctor today for routine appointment and was found to have new onset atrial fibrillation with rapid ventricular response.  Patient states she was told she had an irregular heartbeat several years ago but states that she never heard of the diagnosis atrial fibrillation, she was never on any blood thinner.  She had seen Dr. Kirke Corin in the past, but not in the last several years.  This morning she felt dizzy and a little fullness up into her neck without chest pain.  She has been kind of fatigued for a couple of weeks, but had not really had infectious symptoms or upper respiratory symptoms.    Past Medical History:  Diagnosis Date  . Abdominal pain, right upper quadrant   . Abnormal stress test    w/fixed defect  . Benign essential tremor   . Carpal tunnel syndrome   . DJD (degenerative joint disease) of knee   . GERD (gastroesophageal reflux disease)   . HTN (hypertension)   . Hyperlipidemia   . Syncope and collapse   . Ulnar nerve lesion   . UTI (urinary tract infection)   . Vitamin D deficiency     Patient Active Problem List   Diagnosis Date Noted  . A-fib (HCC) 11/01/2017  . Memory change 06/27/2016  . Acute cystitis without hematuria 11/03/2015  . Lightheadedness 10/31/2015  . Bradycardia 09/19/2014  . Health care maintenance 09/02/2014  . Medicare annual wellness visit, subsequent 08/21/2012  . Tremor, essential 08/21/2012  . Hypertension 11/22/2011  . Hyperlipidemia 11/22/2011    Past Surgical History:  Procedure Laterality Date  . Bilateral Bletharoplastices    . BREAST BIOPSY  1957 and 1962   x 2, Nml per pt   . BREAST CYST EXCISION     x 2 - nml path per pt  . CARPAL TUNNEL RELEASE  2004  . CATARACT EXTRACTION    . TOTAL KNEE ARTHROPLASTY     bilateral, total, Marcy Panning  . TOTAL SHOULDER REPLACEMENT  08/2010   left  . TUBAL LIGATION    . UMBILICAL HERNIA REPAIR    . VAGINAL DELIVERY     1 set Twins/2 single births    Prior to Admission medications   Medication Sig Start Date End Date Taking? Authorizing Provider  aspirin EC 81 MG tablet Take 81 mg by mouth daily.    [provider]  Biotin 5000 MCG TABS Take by mouth daily.    [provider]  Ginkgo Biloba (GINKOBA PO) Take 1 tablet by mouth.    [provider]  hydrochlorothiazide (HYDRODIURIL) 25 MG tablet Take 1 tablet (25 mg total) by mouth daily. 04/14/17   Dale Rockfish, MD  lisinopril (PRINIVIL,ZESTRIL) 40 MG tablet Take 1 tablet (40 mg total) by mouth daily. 04/14/17   Dale Aberdeen, MD  Melatonin 5 MG TABS Take 1 tablet by mouth.    [provider]  Multiple Vitamins-Minerals (MULTIVITAMIN WITH MINERALS) tablet Take 1 tablet by mouth daily.    [provider]  Ubiquinol 200 MG CAPS Take by mouth.    [provider]    Allergies  Allergen Reactions  . Amlodipine Other (See Comments)    malaise malaise  .  Amoxicillin-Pot Clavulanate     Other reaction(s): Other (See Comments) Cannot remember.  . Penicillins   . Codeine Rash    Family History  Problem Relation Age of Onset  . Hypertension Mother   . Arthritis Mother   . Stroke Mother   . Dementia Mother   . Early death Father [id number]       from brain cancer  . Brain cancer Father [id number]  . Breast cancer Maternal Aunt   . Alcohol abuse Other     Social History Social History   Tobacco Use  . Smoking status: Never Smoker  . Smokeless tobacco: Never Used  Substance Use Topics  . Alcohol use: No  . Drug use: No    Review of Systems  Constitutional: Negative for fever. Eyes: Negative for visual  changes. ENT: Negative for sore throat. Cardiovascular: Negative for chest pain. Respiratory: Negative for shortness of breath. Gastrointestinal: Negative for abdominal pain, vomiting and diarrhea. Genitourinary: Negative for dysuria. Musculoskeletal: Negative for back pain. Skin: Negative for rash. Neurological: Negative for headache.  For some dizziness.  ____________________________________________   PHYSICAL EXAM:  VITAL SIGNS: ED Triage Vitals  Enc Vitals Group     BP 11/01/17 1212 (!) 152/100     Pulse Rate 11/01/17 1212 (!) 142     Resp --      Temp 11/01/17 1212 (!) 97.5 F (36.4 C)     Temp Source 11/01/17 1212 Oral     SpO2 11/01/17 1212 98 %     Weight 11/01/17 1213 169 lb (76.7 kg)     Height 11/01/17 1213 4\' 11"  (1.499 m)     Head Circumference --      Peak Flow --      Pain Score --      Pain Loc --      Pain Edu? --      Excl. in GC? --      Constitutional: Alert and oriented. Well appearing and in no distress. HEENT   Head: Normocephalic and atraumatic.      Eyes: Conjunctivae are normal. Pupils equal and round.       Ears:         Nose: No congestion/rhinnorhea.   Mouth/Throat: Mucous membranes are moist.   Neck: No stridor. Cardiovascular/Chest: Tachycardic, irregularly irregular rhythm.  No murmurs, rubs, or gallops. Respiratory: Normal respiratory effort without tachypnea nor retractions. Breath sounds are clear and equal bilaterally. No wheezes/rales/rhonchi. Gastrointestinal: Soft. No distention, no guarding, no rebound. Nontender.    Genitourinary/rectal:Deferred Musculoskeletal: Nontender with normal range of motion in all extremities. No joint effusions.  No lower extremity tenderness.  1+ lower extremity pitting edema bilaterally, slightly more so on the left. Neurologic:  Normal speech and language. No gross or focal neurologic deficits are appreciated. Skin:  Skin is warm, dry and intact. No rash noted. Psychiatric: Mood and  affect are normal. Speech and behavior are normal. Patient exhibits appropriate insight and judgment.   ____________________________________________  LABS (pertinent positives/negatives) I, Governor Rooksebecca Micheala Morissette, MD the attending physician have reviewed the labs noted below.  Labs Reviewed  BASIC METABOLIC PANEL - Abnormal; Notable for the following components:      Result Value   Glucose, Bld 107 (*)    All other components within normal limits  BRAIN NATRIURETIC PEPTIDE - Abnormal; Notable for the following components:   B Natriuretic Peptide 366.0 (*)    All other components within normal limits  CBC  TROPONIN I    ____________________________________________  EKG I, Governor Rooks, MD, the attending physician have personally viewed and interpreted all ECGs.  142 bpm.  Atrial fibrillation with rapid ventricular response.  Occasional PVC.  Narrow QRS.  Normal axis.  Nonspecific ST and T wave ____________________________________________  RADIOLOGY All Xrays were viewed by me.  Imaging interpreted by Radiologist, and I, Governor Rooks, MD the attending physician have reviewed the radiologist interpretation noted below.  Chest x-ray two-view:  FINDINGS: Moderate left pleural effusion. Trace right pleural effusion. No pneumothorax. Bilateral diffuse mild interstitial thickening. Stable cardiomegaly. Thoracic aortic atherosclerosis.  Total reverse left shoulder arthroplasty.  IMPRESSION: 1. Findings concerning for mild CHF. Moderate left pleural effusion. __________________________________________  PROCEDURES  Procedure(s) performed: None  Critical Care performed: CRITICAL CARE Performed by: Governor Rooks   Total critical care time: 30 minutes  Critical care time was exclusive of separately billable procedures and treating other patients.  Critical care was necessary to treat or prevent imminent or life-threatening deterioration.  Critical care was time spent personally by  me on the following activities: development of treatment plan with patient and/or surrogate as well as nursing, discussions with consultants, evaluation of patient's response to treatment, examination of patient, obtaining history from patient or surrogate, ordering and performing treatments and interventions, ordering and review of laboratory studies, ordering and review of radiographic studies, pulse oximetry and re-evaluation of patient's condition.    ____________________________________________  ED COURSE / ASSESSMENT AND PLAN  Pertinent labs & imaging results that were available during my care of the patient were reviewed by me and considered in my medical decision making (see chart for details).    Patient arrived with rapid A. fib, good blood pressure.  It is unclear to me whether not she has been diagnosed with A. fib in the past, but she does not recognize that diagnosis.  This may be new onset rapid A. fib.  She was given diltiazem bolus x2 with improvement from 140 down to 120 but return of the persistent tachycardia.  Patient will be placed on diltiazem drip and admitted to the hospital for further management and workup for new onset rapid A. fib.  Additional laboratory studies are overall reassuring.  DIFFERENTIAL DIAGNOSIS: Including but not limited to anemia, dehydration, congestive heart failure, ACS, etc.  CONSULTATIONS:   Hospitalist for admission.   Patient / Family / Caregiver informed of clinical course, medical decision-making process, and agree with plan.   ___________________________________________   FINAL CLINICAL IMPRESSION(S) / ED DIAGNOSES   Final diagnoses:  New onset atrial fibrillation (HCC)  Atrial fibrillation with rapid ventricular response (HCC)      ___________________________________________        Note: This dictation was prepared with Dragon dictation. Any transcriptional errors that result from this process are  unintentional    Governor Rooks, MD 11/01/17 1511

## 2017-11-01 NOTE — ED Notes (Signed)
This RN into room to answer call light. Family upset and this RN tried to explain what is going on.

## 2017-11-01 NOTE — Progress Notes (Signed)
   11/01/17 2000  Clinical Encounter Type  Visited With Family;Patient not available  Visit Type Other (Comment);Initial;Spiritual support;Social support (Rapid Response)  Referral From Nurse  Spiritual Encounters  Spiritual Needs Emotional  [facility] responded to Rapid Response; [place] met family outside room and offered social, spiritual and emotional support and escorted family to ICU waiting area.

## 2017-11-01 NOTE — Progress Notes (Signed)
ANTICOAGULATION CONSULT NOTE - Initial Consult  Pharmacy Consult for Lovenox (enoxaparin) Indication: atrial fibrillation  Allergies  Allergen Reactions  . Amlodipine Other (See Comments)    malaise malaise  . Amoxicillin-Pot Clavulanate     Other reaction(s): Other (See Comments) Cannot remember.  . Penicillins     Has patient had a PCN reaction causing immediate rash, facial/tongue/throat swelling, SOB or lightheadedness with hypotension: Unknown Has patient had a PCN reaction causing severe rash involving mucus membranes or skin necrosis: Unknown Has patient had a PCN reaction that required hospitalization: Unknown Has patient had a PCN reaction occurring within the last 10 years: No If all of the above answers are "NO", then may proceed with Cephalosporin use.  . Codeine Rash    Patient Measurements: Height: 4\' 11"  (149.9 cm) Weight: 169 lb (76.7 kg) IBW/kg (Calculated) : 43.2 Heparin Dosing Weight:    Vital Signs: Temp: 97.5 F (36.4 C) (01/15 1212) Temp Source: Oral (01/15 1212) BP: 131/99 (01/15 1531) Pulse Rate: 115 (01/15 1535)  Labs: Recent Labs    11/01/17 1216  HGB 13.5  HCT 41.2  PLT 210  CREATININE 0.75  TROPONINI <0.03    Estimated Creatinine Clearance: 49.3 mL/min (by C-G formula based on SCr of 0.75 mg/dL).   Medical History: Past Medical History:  Diagnosis Date  . Abdominal pain, right upper quadrant   . Abnormal stress test    w/fixed defect  . Benign essential tremor   . Carpal tunnel syndrome   . DJD (degenerative joint disease) of knee   . GERD (gastroesophageal reflux disease)   . HTN (hypertension)   . Hyperlipidemia   . Syncope and collapse   . Ulnar nerve lesion   . UTI (urinary tract infection)   . Vitamin D deficiency     Medications:  Scheduled:  . enoxaparin (LOVENOX) injection  1 mg/kg Subcutaneous Q12H    Assessment: 84 yo F with Atrial fibrillation w/ RVR to start Lovenox 1 mg/kg q12h. Patient on aspirin at  home. Hgb 13.5,  Plt 210  Goal of Therapy:  Monitor platelets by anticoagulation protocol: Yes   Plan:  Will begin Lovenox 1 mg/kg subcutaneously q12h for Afib.  Verne Cove A 11/01/2017,4:24 PM

## 2017-11-01 NOTE — Assessment & Plan Note (Signed)
New onset afib with ventricular rate 140s.  Given age [age] and new onset, discussed further evaluation and treatment in ER.  Will need to get rate under control and initiate w/up for possible etiology.  Also discussed the need for anticoagulation.  Pt and daughter agree.  ER notified.  To ER for evaluation.

## 2017-11-01 NOTE — Progress Notes (Signed)
Patient ID: Cheryl Winters, female   DOB: June 24, 1934, 84 y.o.   MRN: [id number]   Subjective:    Patient ID: Cheryl Winters, female    DOB: [birth year], 84 y.o.   MRN: [id number]  HPI  Patient here for a scheduled follow up.  She is accompanied by her daughter.  History obtained from both of them.  She has been having intermittent episodes of "dizziness" - a "funny headed sensation".  Daughter reports she noticed today.  No chest pain.  Does have some intermittent episodes of trying to get a good breath in.  No increased cough or congestion.  No acid reflux.  No abdominal pain.  Bowels moving.  No increased heart rate or palpitations.     Past Medical History:  Diagnosis Date  . Abdominal pain, right upper quadrant   . Abnormal stress test    w/fixed defect  . Benign essential tremor   . Carpal tunnel syndrome   . DJD (degenerative joint disease) of knee   . GERD (gastroesophageal reflux disease)   . HTN (hypertension)   . Hyperlipidemia   . Syncope and collapse   . Ulnar nerve lesion   . UTI (urinary tract infection)   . Vitamin D deficiency    Past Surgical History:  Procedure Laterality Date  . Bilateral Bletharoplastices    . BREAST BIOPSY  1957 and 1962   x 2, Nml per pt  . BREAST CYST EXCISION     x 2 - nml path per pt  . CARPAL TUNNEL RELEASE  2004  . CATARACT EXTRACTION    . TOTAL KNEE ARTHROPLASTY     bilateral, total, Marcy Panning  . TOTAL SHOULDER REPLACEMENT  08/2010   left  . TUBAL LIGATION    . UMBILICAL HERNIA REPAIR    . VAGINAL DELIVERY     1 set Twins/2 single births   Family History  Problem Relation Age of Onset  . Hypertension Mother   . Arthritis Mother   . Stroke Mother   . Dementia Mother   . Early death Father 72       from brain cancer  . Brain cancer Father 68  . Breast cancer Maternal Aunt   . Alcohol abuse Other    Social History   Socioeconomic History  . Marital status: Widowed    Spouse name: None  . Number of children: 4    . Years of education: None  . Highest education level: None  Social Needs  . Financial resource strain: Not hard at all  . Food insecurity - worry: Never true  . Food insecurity - inability: Never true  . Transportation needs - medical: No  . Transportation needs - non-medical: No  Occupational History  . Occupation: Retired - Biomedical scientist  Tobacco Use  . Smoking status: Never Smoker  . Smokeless tobacco: Never Used  Substance and Sexual Activity  . Alcohol use: No  . Drug use: No  . Sexual activity: Not Currently  Other Topics Concern  . None  Social History Narrative   Lives alone in [place]. Widow. Has children who live nearby.    Outpatient Encounter Medications as of 11/01/2017  Medication Sig  . aspirin EC 81 MG tablet Take 81 mg by mouth daily.  . Biotin 5000 MCG TABS Take by mouth daily.  . Ginkgo Biloba (GINKOBA PO) Take 1 tablet by mouth.  . hydrochlorothiazide (HYDRODIURIL) 25 MG tablet Take 1 tablet (25 mg total) by mouth daily.  Marland Kitchen  lisinopril (PRINIVIL,ZESTRIL) 40 MG tablet Take 1 tablet (40 mg total) by mouth daily.  . Melatonin 5 MG TABS Take 1 tablet by mouth.  . Multiple Vitamins-Minerals (MULTIVITAMIN WITH MINERALS) tablet Take 1 tablet by mouth daily.  Marland Kitchen Ubiquinol 200 MG CAPS Take by mouth.   No facility-administered encounter medications on file as of 11/01/2017.     Review of Systems  Constitutional: Negative for appetite change and unexpected weight change.  HENT: Negative for congestion.        Question of sinus pressure.   Respiratory: Negative for cough, chest tightness and shortness of breath.   Cardiovascular: Negative for chest pain, palpitations and leg swelling.  Gastrointestinal: Negative for abdominal pain, diarrhea, nausea and vomiting.  Genitourinary: Negative for difficulty urinating and dysuria.  Musculoskeletal: Negative for back pain and joint swelling.  Skin: Negative for color change and rash.  Neurological: Negative for  dizziness, light-headedness and headaches.  Psychiatric/Behavioral: Negative for agitation and dysphoric mood.       Objective:    Physical Exam  Constitutional: She appears well-developed and well-nourished. No distress.  HENT:  Nose: Nose normal.  Mouth/Throat: Oropharynx is clear and moist.  Neck: Neck supple. No thyromegaly present.  Cardiovascular: Normal rate.  Irregular rhythm ventricular rate 140.   Pulmonary/Chest: Breath sounds normal. No respiratory distress. She has no wheezes.  Abdominal: Soft. Bowel sounds are normal. There is no tenderness.  Musculoskeletal: She exhibits no edema or tenderness.  Lymphadenopathy:    She has no cervical adenopathy.  Skin: No rash noted. No erythema.  Psychiatric: She has a normal mood and affect. Her behavior is normal.    BP 110/70   Pulse (!) 50   Temp 97.9 F (36.6 C) (Oral)   Resp 15   Ht 4\' 11"  (1.499 m)   Wt 169 lb 12.1 oz (77 kg)   SpO2 95%   BMI 34.29 kg/m  Wt Readings from Last 3 Encounters:  11/01/17 169 lb 12.1 oz (77 kg)  11/01/17 169 lb 12.8 oz (77 kg)  07/19/17 173 lb 12.8 oz (78.8 kg)     Lab Results  Component Value Date   WBC 4.6 07/15/2017   HGB 13.4 07/15/2017   HCT 39.8 07/15/2017   PLT 211.0 07/15/2017   GLUCOSE 90 07/15/2017   CHOL 245 (H) 07/15/2017   TRIG 59.0 07/15/2017   HDL 64.10 07/15/2017   LDLDIRECT 123.9 08/27/2013   LDLCALC 169 (H) 07/15/2017   ALT 13 07/15/2017   AST 16 07/15/2017   NA 139 07/15/2017   K 3.8 07/15/2017   CL 102 07/15/2017   CREATININE 0.66 07/15/2017   BUN 18 07/15/2017   CO2 30 07/15/2017   TSH 2.78 07/15/2017   INR 1.0 10/07/2014   MICROALBUR 0.3 09/02/2014    Mm Digital Screening Bilateral  Result Date: 04/27/2017 CLINICAL DATA:  Screening. EXAM: DIGITAL SCREENING BILATERAL MAMMOGRAM WITH CAD COMPARISON:  Previous exam(s). ACR Breast Density Category c: The breast tissue is heterogeneously dense, which may obscure small masses. FINDINGS: There are no  findings suspicious for malignancy. Images were processed with CAD. IMPRESSION: No mammographic evidence of malignancy. A result letter of this screening mammogram will be mailed directly to the patient. RECOMMENDATION: Screening mammogram in one year. (Code:SM-B-01Y) BI-RADS CATEGORY  1: Negative. Electronically Signed   By: Dalphine Handing M.D.   On: 04/27/2017 07:54       Assessment & Plan:   Problem List Items Addressed This Visit    A-fib (HCC)  New onset afib with ventricular rate 140s.  Given age 84 and new onset, discussed further evaluation and treatment in ER.  Will need to get rate under control and initiate w/up for possible etiology.  Also discussed the need for anticoagulation.  Pt and daughter agree.  ER notified.  To ER for evaluation.        Hypertension    Blood pressure a little elevated on recheck.  Has been under reasonable control.  In afib today.  To ER for further evaluation and medication adjustment.  Need to get heart rate controlled.        Lightheadedness    Possible coming from afib.  Worse currently.  To ER as outlined.         Other Visit Diagnoses    Irregular heartbeat    -  Primary   Relevant Orders   EKG 12-Lead (Completed)     I spent 45 minutes with the patient and more than 50% of the time was spent in consultation regarding the above.  Time spent discussed current symptoms and concerns.  Time also spent going over new diagnosis of afib and treatment options.    Dale DurhamSCOTT, Leilanee Righetti, MD

## 2017-11-01 NOTE — Consult Note (Signed)
PULMONARY / CRITICAL CARE MEDICINE   Name: Cheryl Winters MRN: [id number] DOB: 10-Dec-1933    ADMISSION DATE:  11/01/2017   CONSULTATION DATE:  11/01/2014  REFERRING MD:  Dr Imogene Burn  REASON: Hypotension 2/2 diltiazem  HISTORY OF PRESENT ILLNESS:   This is an 84 y/o female with a PMH as listed below who was admitted earlier today with new onset Afib with RVR, and dizziness after being sent from her PCP's office.  Her heart rate was in the 140s, irregular, associated with dizziness and fatigue.  She was started on diltiazem and admitted to telemetry.  This evening a rapid response was called because patient's heart rate was persistently elevated on diltiazem hence the dose was increased and she was also given metoprolol with a subsequent drop in her blood pressure.  Her blood pressure dropped as low as 58/11 with a map of 21.  She was given a fluid bolus and transferred to the ICU for further management.  Her blood pressure has improved to 76/54 with a map of 56.  She reports generalized fatigue, intermittent palpitations and a feeling of not being well but denies chest pain, nausea and vomiting.  Vision does not have a history of atrial fibrillation. She is currently on full strength Lovenox for anticoagulation.  PAST MEDICAL HISTORY :  She  has a past medical history of Abdominal pain, right upper quadrant, Abnormal stress test, Benign essential tremor, Carpal tunnel syndrome, DJD (degenerative joint disease) of knee, GERD (gastroesophageal reflux disease), HTN (hypertension), Hyperlipidemia, Syncope and collapse, Ulnar nerve lesion, UTI (urinary tract infection), and Vitamin D deficiency.  PAST SURGICAL HISTORY: She  has a past surgical history that includes Vaginal delivery; Breast cyst excision; Umbilical hernia repair; Total knee arthroplasty; Total shoulder replacement (08/2010); Carpal tunnel release (2004); Tubal ligation; Cataract extraction; Bilateral Bletharoplastices; and Breast biopsy  (1957 and 1962).  Allergies  Allergen Reactions  . Amlodipine Other (See Comments)    malaise malaise  . Amoxicillin-Pot Clavulanate     Other reaction(s): Other (See Comments) Cannot remember.  . Penicillins     Has patient had a PCN reaction causing immediate rash, facial/tongue/throat swelling, SOB or lightheadedness with hypotension: Unknown Has patient had a PCN reaction causing severe rash involving mucus membranes or skin necrosis: Unknown Has patient had a PCN reaction that required hospitalization: Unknown Has patient had a PCN reaction occurring within the last 10 years: No If all of the above answers are "NO", then may proceed with Cephalosporin use.  . Codeine Rash    No current facility-administered medications on file prior to encounter.    Current Outpatient Medications on File Prior to Encounter  Medication Sig  . aspirin EC 81 MG tablet Take 81 mg by mouth daily.  . Biotin 5000 MCG TABS Take 5,000 mcg by mouth daily.   . cholecalciferol (VITAMIN D) 1000 units tablet Take 1,000 Units by mouth daily.  . hydrochlorothiazide (HYDRODIURIL) 25 MG tablet Take 1 tablet (25 mg total) by mouth daily.  Marland Kitchen lisinopril (PRINIVIL,ZESTRIL) 40 MG tablet Take 1 tablet (40 mg total) by mouth daily.  . Melatonin 5 MG TABS Take 5 mg by mouth at bedtime.   . Multiple Vitamins-Minerals (MULTIVITAMIN WITH MINERALS) tablet Take 1 tablet by mouth daily.  Marland Kitchen omega-3 acid ethyl esters (LOVAZA) 1 g capsule Take 1 g by mouth daily.  Marland Kitchen Ubiquinol 200 MG CAPS Take 200 mg by mouth daily.     FAMILY HISTORY:  Her indicated that her mother is deceased.  She indicated that her father is deceased. She indicated that her sister is alive. She indicated that the status of her maternal aunt is unknown. She indicated that the status of her other is unknown.   SOCIAL HISTORY: She  reports that  has never smoked. she has never used smokeless tobacco. She reports that she does not drink alcohol or use  drugs.  REVIEW OF SYSTEMS:   Constitutional: Negative for fever and chills but positive for generalized malaise  HENT: Negative for congestion and rhinorrhea.  Eyes: Negative for redness and visual disturbance.  Respiratory: Negative for shortness of breath and wheezing.  Cardiovascular: Negative for chest pain and palpitations.  Gastrointestinal: Negative  for nausea , vomiting and abdominal pain and loose stools but positive for heart burn Genitourinary: Negative for dysuria and urgency.  Endocrine: Denies polyuria, polyphagia and heat intolerance Musculoskeletal: Negative for myalgias and arthralgias.  Skin: Negative for pallor and wound.  Neurological: Negative for dizziness and headaches   SUBJECTIVE:  Awake and confused  VITAL SIGNS: BP (!) 83/59 (BP Location: Left Arm)   Pulse 76   Temp 98 F (36.7 C) (Oral)   Resp (!) 21   Ht 4\' 11"  (1.499 m)   Wt 76.7 kg (169 lb)   SpO2 91%   BMI 34.13 kg/m   HEMODYNAMICS:    VENTILATOR SETTINGS:    INTAKE / OUTPUT: I/O last 3 completed shifts: In: 278.3 [P.O.:240; I.V.:38.3] Out: -   PHYSICAL EXAMINATION: General: NAD Neuro:  AAO X2, intermittent confusion, follows commands, moves all extremities HEENT:  PERRLA, neck is supple with no JVD Cardiovascular: Irregular-irregula, S1/S2, no MRG, +2 pulses, +1 edema Lungs:  Normal WOB, Bilateral breath sounds; diminished in the bases, no wheezes or rhonchi,  Abdomen:  Non-distended, normal bowel sounds Musculoskeletal:  +rom, no deformities Skin:  Warm and dry  LABS:  BMET Recent Labs  Lab 11/01/17 1216  NA 141  K 3.6  CL 102  CO2 27  BUN 19  CREATININE 0.75  GLUCOSE 107*    Electrolytes Recent Labs  Lab 11/01/17 1216  CALCIUM 10.0  MG 1.8    CBC Recent Labs  Lab 11/01/17 1216  WBC 5.4  HGB 13.5  HCT 41.2  PLT 210    Coag's No results for input(s): APTT, INR in the last 168 hours.  Sepsis Markers No results for input(s): LATICACIDVEN,  PROCALCITON, O2SATVEN in the last 168 hours.  ABG No results for input(s): PHART, PCO2ART, PO2ART in the last 168 hours.  Liver Enzymes No results for input(s): AST, ALT, ALKPHOS, BILITOT, ALBUMIN in the last 168 hours.  Cardiac Enzymes Recent Labs  Lab 11/01/17 1216 11/01/17 2028  TROPONINI <0.03 0.03*    Glucose Recent Labs  Lab 11/01/17 2020  GLUCAP 114*    Imaging Dg Chest 2 View  Result Date: 11/01/2017 CLINICAL DATA:  Atrial fibrillation EXAM: CHEST  2 VIEW COMPARISON:  10/07/2014 FINDINGS: Moderate left pleural effusion. Trace right pleural effusion. No pneumothorax. Bilateral diffuse mild interstitial thickening. Stable cardiomegaly. Thoracic aortic atherosclerosis. Total reverse left shoulder arthroplasty. IMPRESSION: 1. Findings concerning for mild CHF. Moderate left pleural effusion. Electronically Signed   By: Elige KoHetal  Patel   On: 11/01/2017 13:23   STUDIES:  2-d echo  CULTURES: Urine culture  ANTIBIOTICS: none  SIGNIFICANT EVENTS: 01/15>admitted  LINES/TUBES: PIVs  DISCUSSION: 84 y/o female admitted with new onset Afib with RVR; now presenting with hypotension secondary to diltiazem and beta blocker  ASSESSMENT  New onset Afib with RVR  Hypotension H/O Hypertension, hyperlipidemia and GERD  PLAN Hemodynamics monitoring per ICU protocol Monitor and correct electrolytes Continue lovenox Start amiodarone gtt EKG reviewed 2-D echo Cycle cardiac enzymes Cardiology consulted GI and DVT prophylaxis: already on full strength Lovenox and Protonix for GERD   FAMILY  - Updates: Patient and family updated at bedside   - Inter-disciplinary family meet or Palliative Care meeting due by: day 7   Matheo Rathbone S. [facility] ANP-BC Pulmonary and Critical Care Medicine [facility] Pager [phone] or [phone]  NB: This document was prepared using Dragon voice recognition software and may include unintentional dictation errors.   11/01/2017,  10:27 PM

## 2017-11-01 NOTE — Progress Notes (Signed)
RRT CALLED TO ROOM , SAT BELOW 88% PLACED ON N/C 4LPM SAT IMPROVED ,

## 2017-11-01 NOTE — H&P (Signed)
[facility] [place] [facility]   PATIENT NAME: Cheryl Winters    MR#:  [id number]  DATE OF BIRTH:  November 09, 1933  DATE OF ADMISSION:  11/01/2017  PRIMARY CARE PHYSICIAN: Dale Deseret, MD   REQUESTING/REFERRING PHYSICIAN: Dr. Shaune Pollack  CHIEF COMPLAINT:   Chief Complaint  Patient presents with  . Irregular Heart Beat   A. fib with RVR. HISTORY OF PRESENT ILLNESS:  Cheryl Winters  is a 84 y.o. female with a known history of hypertension, GERD, benign essential tremor and hyperlipidemia.  The patient was sent from PCPs office due to A. fib with RVR.  The patient has had regular heartbeat several years ago but never had a diagnosis of A. fib.  She complains of dizziness and burning sensation of chest due to acid reflux.  She denies any other symptoms.  Her heart rate was up to 140s.  She was given 2 doses of Cardizem IV without improvement.  Cardizem drip was started.  Heart rate is still about 120-140s. PAST MEDICAL HISTORY:   Past Medical History:  Diagnosis Date  . Abdominal pain, right upper quadrant   . Abnormal stress test    w/fixed defect  . Benign essential tremor   . Carpal tunnel syndrome   . DJD (degenerative joint disease) of knee   . GERD (gastroesophageal reflux disease)   . HTN (hypertension)   . Hyperlipidemia   . Syncope and collapse   . Ulnar nerve lesion   . UTI (urinary tract infection)   . Vitamin D deficiency     PAST SURGICAL HISTORY:   Past Surgical History:  Procedure Laterality Date  . Bilateral Bletharoplastices    . BREAST BIOPSY  1957 and 1962   x 2, Nml per pt  . BREAST CYST EXCISION     x 2 - nml path per pt  . CARPAL TUNNEL RELEASE  2004  . CATARACT EXTRACTION    . TOTAL KNEE ARTHROPLASTY     bilateral, total, Marcy Panning  . TOTAL SHOULDER REPLACEMENT  08/2010   left  . TUBAL LIGATION    . UMBILICAL HERNIA REPAIR    . VAGINAL DELIVERY     1 set Twins/2 single births    SOCIAL HISTORY:   Social History   Tobacco  Use  . Smoking status: Never Smoker  . Smokeless tobacco: Never Used  Substance Use Topics  . Alcohol use: No    FAMILY HISTORY:   Family History  Problem Relation Age of Onset  . Hypertension Mother   . Arthritis Mother   . Stroke Mother   . Dementia Mother   . Early death Father 12       from brain cancer  . Brain cancer Father 43  . Breast cancer Maternal Aunt   . Alcohol abuse Other     DRUG ALLERGIES:   Allergies  Allergen Reactions  . Amlodipine Other (See Comments)    malaise malaise  . Amoxicillin-Pot Clavulanate     Other reaction(s): Other (See Comments) Cannot remember.  . Penicillins   . Codeine Rash    REVIEW OF SYSTEMS:   Review of Systems  Constitutional: Negative for chills, fever and malaise/fatigue.  HENT: Negative for sore throat.   Eyes: Negative for blurred vision and double vision.  Respiratory: Negative for cough, hemoptysis, shortness of breath, wheezing and stridor.   Cardiovascular: Negative for chest pain, palpitations, orthopnea and leg swelling.  Gastrointestinal: Positive for heartburn. Negative for abdominal pain, blood in  stool, diarrhea, melena, nausea and vomiting.  Genitourinary: Negative for dysuria, flank pain and hematuria.  Musculoskeletal: Negative for back pain and joint pain.  Skin: Negative for rash.  Neurological: Positive for dizziness. Negative for sensory change, focal weakness, seizures, loss of consciousness, weakness and headaches.  Endo/Heme/Allergies: Negative for polydipsia.  Psychiatric/Behavioral: Negative for depression. The patient is nervous/anxious.     MEDICATIONS AT HOME:   Prior to Admission medications   Medication Sig Start Date End Date Taking? Authorizing Provider  aspirin EC 81 MG tablet Take 81 mg by mouth daily.    [provider]  Biotin 5000 MCG TABS Take by mouth daily.    [provider]  Ginkgo Biloba (GINKOBA PO) Take 1 tablet by mouth.    [provider]    hydrochlorothiazide (HYDRODIURIL) 25 MG tablet Take 1 tablet (25 mg total) by mouth daily. 04/14/17   Dale [place], MD  lisinopril (PRINIVIL,ZESTRIL) 40 MG tablet Take 1 tablet (40 mg total) by mouth daily. 04/14/17   Dale [place], MD  Melatonin 5 MG TABS Take 1 tablet by mouth.    [provider]  Multiple Vitamins-Minerals (MULTIVITAMIN WITH MINERALS) tablet Take 1 tablet by mouth daily.    [provider]  Ubiquinol 200 MG CAPS Take by mouth.    [provider]      VITAL SIGNS:  Blood pressure (!) 131/99, pulse (!) 115, temperature (!) 97.5 F (36.4 C), temperature source Oral, resp. rate (!) 23, height 4\' 11"  (1.499 m), weight 169 lb (76.7 kg), SpO2 95 %.  PHYSICAL EXAMINATION:  Physical Exam  GENERAL:  84 y.o.-year-old patient lying in the bed with no acute distress.  EYES: Pupils equal, round, reactive to light and accommodation. No scleral icterus. Extraocular muscles intact.  HEENT: Head atraumatic, normocephalic. Oropharynx and nasopharynx clear.  NECK:  Supple, no jugular venous distention. No thyroid enlargement, no tenderness.  LUNGS: Normal breath sounds bilaterally, no wheezing, mild basilar rales, no rhonchi or crepitation. No use of accessory muscles of respiration.  CARDIOVASCULAR: Irregular rate and rhythm, tachycardia. No murmurs, rubs, or gallops.  ABDOMEN: Soft, nontender, nondistended. Bowel sounds present. No organomegaly or mass.  EXTREMITIES: No cyanosis, or clubbing.  Left leg trace edema.  No calf tenderness. NEUROLOGIC: Cranial nerves II through XII are intact. Muscle strength 5/5 in all extremities. Sensation intact. Gait not checked.  PSYCHIATRIC: The patient is alert and oriented x 3.  SKIN: No obvious rash, lesion, or ulcer.   LABORATORY PANEL:   CBC Recent Labs  Lab 11/01/17 1216  WBC 5.4  HGB 13.5  HCT 41.2  PLT 210    ------------------------------------------------------------------------------------------------------------------  Chemistries  Recent Labs  Lab 11/01/17 1216  NA 141  K 3.6  CL 102  CO2 27  GLUCOSE 107*  BUN 19  CREATININE 0.75  CALCIUM 10.0   ------------------------------------------------------------------------------------------------------------------  Cardiac Enzymes Recent Labs  Lab 11/01/17 1216  TROPONINI <0.03   ------------------------------------------------------------------------------------------------------------------  RADIOLOGY:  Dg Chest 2 View  Result Date: 11/01/2017 CLINICAL DATA:  Atrial fibrillation EXAM: CHEST  2 VIEW COMPARISON:  10/07/2014 FINDINGS: Moderate left pleural effusion. Trace right pleural effusion. No pneumothorax. Bilateral diffuse mild interstitial thickening. Stable cardiomegaly. Thoracic aortic atherosclerosis. Total reverse left shoulder arthroplasty. IMPRESSION: 1. Findings concerning for mild CHF. Moderate left pleural effusion. Electronically Signed   By: Elige Ko   On: 11/01/2017 13:23      IMPRESSION AND PLAN:   A. fib with RVR The patient will be admitted to telemetry floor.  Continue Cardizem drip, start Lovenox subcu every 12 hours.  Echocardiogram and cardiology consult.  Hypertension.  Continue lisinopril and HCTZ.  GERD.  Start Protonix and Maalox as needed.  All the records are reviewed and case discussed with ED provider. Management plans discussed with the patient, family and they are in agreement.  CODE STATUS: Full code  TOTAL TIME TAKING CARE OF THIS PATIENT: 56 minutes.    Shaune PollackQing Zavion Sleight M.D on 11/01/2017 at 3:52 PM  Between 7am to 6pm - Pager - [phone]  After 6pm go to [email or web address] - Social research officer, governmentpassword [email or web address]  [facility]  Office  [phone]  CC: Primary care physician; Dale DurhamScott, Charlene, MD   Note: This dictation was prepared with Dragon dictation along with  smaller phrase technology. Any transcriptional errors that result from this process are unin

## 2017-11-01 NOTE — Assessment & Plan Note (Signed)
Blood pressure a little elevated on recheck.  Has been under reasonable control.  In afib today.  To ER for further evaluation and medication adjustment.  Need to get heart rate controlled.

## 2017-11-01 NOTE — Progress Notes (Signed)
Subjective:   Cheryl LoganRita Steele Winters is a 84 y.o. female who presents for Medicare Annual (Subsequent) preventive examination.  Review of Systems:  No ROS.  Medicare Wellness Visit. Additional risk factors are reflected in the social history.   Cardiac Risk Factors include: advanced age (>7855men, 24>65 women);hypertension;obesity (BMI >30kg/m2)     Objective:     Vitals: BP 110/70 (BP Location: Left Arm, Patient Position: Sitting, Cuff Size: Normal)   Pulse (!) 50   Temp 97.9 F (36.6 C) (Oral)   Resp 15   Ht 4' 11.84" (1.52 m)   Wt 169 lb 12.8 oz (77 kg)   SpO2 95%   BMI 33.34 kg/m   Body mass index is 33.34 kg/m.  Advanced Directives 11/01/2017 11/01/2017 10/25/2016 10/24/2015  Does Patient Have a Medical Advance Directive? Yes Yes Yes Yes  Type of Advance Directive Living will Healthcare Power of [place];Living will Healthcare Power of [place];Living will Healthcare Power of [place];Living will  Does patient want to make changes to medical advance directive? - No - Patient declined No - Patient declined No - Patient declined  Copy of Healthcare Power of Attorney in Chart? No - copy requested No - copy requested No - copy requested No - copy requested    Tobacco Social History   Tobacco Use  Smoking Status Never Smoker  Smokeless Tobacco Never Used     Counseling given: Not Answered   Clinical Intake:  Pre-visit preparation completed: Yes  Pain : No/denies pain     Nutritional Status: BMI > 30  Obese Diabetes: No  How often do you need to have someone help you when you read instructions, pamphlets, or other written materials from your doctor or pharmacy?: 3 - Sometimes(She reads without issues, however she has difficulty remembering.  Daughter assists as needed.)  Interpreter Needed?: No     Past Medical History:  Diagnosis Date  . Abdominal pain, right upper quadrant   . Abnormal stress test    w/fixed defect  . Benign essential tremor   . Carpal tunnel  syndrome   . DJD (degenerative joint disease) of knee   . GERD (gastroesophageal reflux disease)   . HTN (hypertension)   . Hyperlipidemia   . Syncope and collapse   . Ulnar nerve lesion   . UTI (urinary tract infection)   . Vitamin D deficiency    Past Surgical History:  Procedure Laterality Date  . Bilateral Bletharoplastices    . BREAST BIOPSY  1957 and 1962   x 2, Nml per pt  . BREAST CYST EXCISION     x 2 - nml path per pt  . CARPAL TUNNEL RELEASE  2004  . CATARACT EXTRACTION    . TOTAL KNEE ARTHROPLASTY     [place]  . TOTAL SHOULDER REPLACEMENT  08/2010   left  . TUBAL LIGATION    . UMBILICAL HERNIA REPAIR    . VAGINAL DELIVERY     1 set Twins/2 single births   Family History  Problem Relation Age of Onset  . Hypertension Mother   . Arthritis Mother   . Stroke Mother   . Dementia Mother   . Early death Father 1642       from brain cancer  . Brain cancer Father [id number]  . Breast cancer Maternal Aunt   . Alcohol abuse Other    Social History   Socioeconomic History  . Marital status: Widowed    Spouse name: None  . Number  of children: 4  . Years of education: None  . Highest education level: None  Social Needs  . Financial resource strain: Not hard at all  . Food insecurity - worry: Never true  . Food insecurity - inability: Never true  . Transportation needs - medical: No  . Transportation needs - non-medical: No  Occupational History  . Occupation: Retired - Biomedical scientist  Tobacco Use  . Smoking status: Never Smoker  . Smokeless tobacco: Never Used  Substance and Sexual Activity  . Alcohol use: No  . Drug use: No  . Sexual activity: Not Currently  Other Topics Concern  . None  Social History Narrative   Lives alone in [place]. Widow. Has children who live nearby.    No facility-administered encounter medications on file as of 11/01/2017.    Outpatient Encounter Medications as of 11/01/2017  Medication Sig  . aspirin  EC 81 MG tablet Take 81 mg by mouth daily.  . Biotin 5000 MCG TABS Take by mouth daily.  . Ginkgo Biloba (GINKOBA PO) Take 1 tablet by mouth.  . hydrochlorothiazide (HYDRODIURIL) 25 MG tablet Take 1 tablet (25 mg total) by mouth daily.  Marland Kitchen lisinopril (PRINIVIL,ZESTRIL) 40 MG tablet Take 1 tablet (40 mg total) by mouth daily.  . Melatonin 5 MG TABS Take 1 tablet by mouth.  . Multiple Vitamins-Minerals (MULTIVITAMIN WITH MINERALS) tablet Take 1 tablet by mouth daily.  Marland Kitchen Ubiquinol 200 MG CAPS Take by mouth.  . [DISCONTINUED] donepezil (ARICEPT) 5 MG tablet Take 5 mg by mouth at bedtime.  . [DISCONTINUED] mupirocin ointment (BACTROBAN) 2 % APPLY TO AFFECTED AREA ON LEG TWICE DAILY    Activities of Daily Living In your present state of health, do you have any difficulty performing the following activities: 11/01/2017  Hearing? N  Vision? N  Difficulty concentrating or making decisions? Y  Comment Memory changes  Walking or climbing stairs? Y  Comment Unsteady gait  Dressing or bathing? N  Doing errands, shopping? Y  Comment She still drives short distances however is accompanied by daughter most of the time for safety.  Preparing Food and eating ? Y  Comment Daughter manages meal prep; self feed  Using the Toilet? N  In the past six months, have you accidently leaked urine? Y  Comment Managed with a daily pad  Do you have problems with loss of bowel control? N  Managing your Medications? Y  Comment Daughter manages  Managing your Finances? Y  Comment son in Proofreader or managing your Housekeeping? N  Some recent data might be hidden    Patient Care Team: Dale [employer], MD as PCP - General (Internal Medicine)    Assessment:   This is a routine wellness examination for [place]. The goal of the wellness visit is to assist the patient how to close the gaps in care and create a preventative care plan for the patient. Presents with daughter. History obtained from both of  them.  HIPAA compliant.  The roster of all physicians providing medical care to patient is listed in the Snapshot section of the chart.  Osteoporosis risk reviewed.    Safety issues reviewed; lives alone.  Smoke and carbon monoxide detectors in the home. No firearms in the home.  Wears seatbelts when driving or riding with others. Discussed the need to wear sunscreen or protective clothing when in direct sunlight. No violence in the home.  Depression- PHQ 2 &9 complete.  She spends most of her time alone  at home and it makes her sad from time to time. No thoughts of self harm or harm towards others.  Time spent on this topic is 8 minutes.   No failures at ADL's or IADL's.  Daughter manages medications, son in Armed forces logistics/support/administrative officer finances. See care everywhere, Gavin Potters.  Followed by Dr. Malvin Johns.   BMI- discussed the importance of a healthy diet, water intake and the benefits of aerobic exercise. Educational material provided.   24 hour diet recall: Regular diet  Daily fluid intake: 4 cups of caffeine, 0 cups of water  Dental- dentures.  Eye- Visual acuity not assessed per patient preference.  Wears corrective lenses when reading.  Sleep patterns- Sleeps 7-8 hours at night.    Health maintenance gaps- closed.  Patient Concerns:Daughter reports episodes of dizziness in the morning but passes soon thereafter. No chest pain, burred vision. HR 50, all other vitals WNL. Deferred to PCP for follow up.   Exercise Activities and Dietary recommendations Current Exercise Habits: Home exercise routine, Type of exercise: walking, Intensity: Mild  Goals    . Increase water intake     Stay hydrated and drink plenty of water        Fall Risk Fall Risk  11/01/2017 10/25/2016 06/25/2016 10/24/2015 09/02/2014  Falls in the past year? Yes No No Yes No  Number falls in past yr: 1 - - 1 -  Comment - - - Dizziness  -  Injury with Fall? No - - No -  Comment - - - Stable and followed by PCP -  Risk for fall  due to : History of fall(s);Impaired balance/gait - - - -  Risk for fall due to: Comment - - - - -  Follow up Falls prevention discussed;Education provided - - Education provided;Falls prevention discussed -      Depression Screen PHQ 2/9 Scores 11/01/2017 04/14/2017 10/25/2016 06/25/2016  PHQ - 2 Score 2 1 0 0  PHQ- 9 Score 2 3 - -     Cognitive Function MMSE - Mini Mental State Exam 11/01/2017 10/24/2015  Not completed: Unable to complete -  Orientation to time - 5  Orientation to Place - 5  Registration - 3  Attention/ Calculation - 5  Recall - 3  Language- name 2 objects - 2  Language- repeat - 1  Language- follow 3 step command - 3  Language- read & follow direction - 1  Write a sentence - 0  Write a sentence-comments - Tremors  Copy design - 0  Copy design-comments - Tremors  Total score - 28     6CIT Screen 10/25/2016  What Year? 0 points  What month? 0 points  What time? 0 points  Count back from 20 0 points  Months in reverse 0 points    Immunization History  Administered Date(s) Administered  . Influenza Split 07/22/2011, 06/21/2012  . Influenza Whole 07/10/2013  . Influenza, High Dose Seasonal PF 06/25/2016, 07/19/2017  . Influenza-Unspecified 07/08/2014, 06/23/2015  . Pneumococcal Conjugate-13 03/19/2014  . Pneumococcal Polysaccharide-23 10/19/2003  . Tdap 10/04/2012  . Zoster 09/30/2008    Screening Tests Health Maintenance  Topic Date Due  . TETANUS/TDAP  10/04/2022  . INFLUENZA VACCINE  Completed  . DEXA SCAN  Completed  . PNA vac Low Risk Adult  Completed      Plan:    End of life planning; Advance aging; Advanced directives discussed. Copy of current HCPOA/Living Will requested.    I have personally reviewed and noted the following in  the patient's chart:   . Medical and social history . Use of alcohol, tobacco or illicit drugs  . Current medications and supplements . Functional ability and status . Nutritional status . Physical  activity . Advanced directives . List of other physicians . Hospitalizations, surgeries, and ER visits in previous 12 months . Vitals . Screenings to include cognitive, depression, and falls . Referrals and appointments  In addition, I have reviewed and discussed with patient certain preventive protocols, quality metrics, and best practice recommendations. A written personalized care plan for preventive services as well as general preventive health recommendations were provided to patient.     Ashok Pall, LPN  1/61/0960   Reviewed above information.  Pt seen and evaluated for dizziness.  Found to be in afib with RVR.  Was sent to ER and admitted.    Dr Lorin Picket

## 2017-11-01 NOTE — ED Notes (Signed)
Pt ambulatory to toilet with standyby assist. 

## 2017-11-01 NOTE — Progress Notes (Signed)
Patient is admitted to the floor with cardizem drip at 15 mg/hr infusing with HR between 80 - 140's , being monitor as per order , family at bedside .

## 2017-11-02 ENCOUNTER — Inpatient Hospital Stay: Payer: Medicare Other

## 2017-11-02 DIAGNOSIS — I10 Essential (primary) hypertension: Secondary | ICD-10-CM

## 2017-11-02 DIAGNOSIS — I4891 Unspecified atrial fibrillation: Principal | ICD-10-CM

## 2017-11-02 DIAGNOSIS — I959 Hypotension, unspecified: Secondary | ICD-10-CM

## 2017-11-02 DIAGNOSIS — J90 Pleural effusion, not elsewhere classified: Secondary | ICD-10-CM

## 2017-11-02 LAB — URINALYSIS, ROUTINE W REFLEX MICROSCOPIC
BACTERIA UA: NONE SEEN
BILIRUBIN URINE: NEGATIVE
Glucose, UA: NEGATIVE mg/dL
Hgb urine dipstick: NEGATIVE
KETONES UR: NEGATIVE mg/dL
Leukocytes, UA: NEGATIVE
Nitrite: NEGATIVE
PH: 6 (ref 5.0–8.0)
PROTEIN: 100 mg/dL — AB
Specific Gravity, Urine: 1.016 (ref 1.005–1.030)

## 2017-11-02 LAB — CBC
HEMATOCRIT: 39.5 % (ref 35.0–47.0)
HEMOGLOBIN: 13.2 g/dL (ref 12.0–16.0)
MCH: 29.4 pg (ref 26.0–34.0)
MCHC: 33.4 g/dL (ref 32.0–36.0)
MCV: 88.2 fL (ref 80.0–100.0)
Platelets: 211 10*3/uL (ref 150–440)
RBC: 4.48 MIL/uL (ref 3.80–5.20)
RDW: 14.1 % (ref 11.5–14.5)
WBC: 6.8 10*3/uL (ref 3.6–11.0)

## 2017-11-02 LAB — TROPONIN I

## 2017-11-02 LAB — BASIC METABOLIC PANEL
ANION GAP: 9 (ref 5–15)
BUN: 22 mg/dL — ABNORMAL HIGH (ref 6–20)
CALCIUM: 9.1 mg/dL (ref 8.9–10.3)
CHLORIDE: 105 mmol/L (ref 101–111)
CO2: 25 mmol/L (ref 22–32)
Creatinine, Ser: 1.06 mg/dL — ABNORMAL HIGH (ref 0.44–1.00)
GFR calc non Af Amer: 48 mL/min — ABNORMAL LOW (ref >60)
GFR, EST AFRICAN AMERICAN: 56 mL/min — AB (ref >60)
Glucose, Bld: 117 mg/dL — ABNORMAL HIGH (ref 65–99)
Potassium: 4 mmol/L (ref 3.5–5.1)
Sodium: 139 mmol/L (ref 135–145)

## 2017-11-02 LAB — MAGNESIUM: Magnesium: 1.9 mg/dL (ref 1.7–2.4)

## 2017-11-02 LAB — PHOSPHORUS: PHOSPHORUS: 4.1 mg/dL (ref 2.5–4.6)

## 2017-11-02 MED ORDER — PANTOPRAZOLE SODIUM 40 MG PO TBEC: 40.0000 mg | DELAYED_RELEASE_TABLET | Freq: Every day | ORAL | Status: DC

## 2017-11-02 MED ORDER — METOPROLOL TARTRATE 25 MG PO TABS: 25.0000 mg | ORAL_TABLET | Freq: Four times a day (QID) | ORAL | Status: DC

## 2017-11-02 MED ORDER — AMIODARONE HCL IN DEXTROSE 360-4.14 MG/200ML-% IV SOLN
30.0000 mg/h | INTRAVENOUS | Status: DC
  Administered 2017-11-02 – 2017-11-03 (×3): 30 mg/h via INTRAVENOUS
  Filled 2017-11-02 (×3): qty 200

## 2017-11-02 MED ORDER — DIGOXIN 0.25 MG/ML IJ SOLN
0.5000 mg | Freq: Once | INTRAMUSCULAR | Status: AC
  Administered 2017-11-02: 0.5 mg via INTRAVENOUS
  Filled 2017-11-02: qty 2

## 2017-11-02 MED ORDER — METOPROLOL TARTRATE 25 MG PO TABS
25.0000 mg | ORAL_TABLET | Freq: Four times a day (QID) | ORAL | Status: DC
  Administered 2017-11-02 – 2017-11-03 (×4): 25 mg via ORAL
  Filled 2017-11-02 (×4): qty 1

## 2017-11-02 MED ORDER — AMIODARONE HCL IN DEXTROSE 360-4.14 MG/200ML-% IV SOLN
60.0000 mg/h | INTRAVENOUS | Status: DC
  Administered 2017-11-02: 60 mg/h via INTRAVENOUS
  Filled 2017-11-02: qty 200

## 2017-11-02 MED ORDER — PANTOPRAZOLE SODIUM 40 MG IV SOLR
40.0000 mg | INTRAVENOUS | Status: DC
  Administered 2017-11-02: 40 mg via INTRAVENOUS
  Filled 2017-11-02: qty 40

## 2017-11-02 NOTE — Progress Notes (Signed)
[employer] [facility] - [place] [facility]                                                                                                                                                                                  Patient Demographics   Cheryl Winters, is a 84 y.o. female, DOB - 1934-10-11, ZOX:[id number]  Admit date - 11/01/2017   Admitting Physician Shaune Pollack, MD  Outpatient Primary MD for the patient is Cheryl [place], MD   LOS - 1  Subjective:  Patient admitted with A. fib with RVR.  Had to be transferred to the ICU for low blood pressure Feeling better stronger  Review of Systems:   CONSTITUTIONAL: No documented fever. No fatigue, weakness. No weight gain, no weight loss.  EYES: No blurry or double vision.  ENT: No tinnitus. No postnasal drip. No redness of the oropharynx.  RESPIRATORY: No cough, no wheeze, no hemoptysis. No dyspnea.  CARDIOVASCULAR: No chest pain. No orthopnea. No palpitations. No syncope.  GASTROINTESTINAL: No nausea, no vomiting or diarrhea. No abdominal pain. No melena or hematochezia.  GENITOURINARY: No dysuria or hematuria.  ENDOCRINE: No polyuria or nocturia. No heat or cold intolerance.  HEMATOLOGY: No anemia. No bruising. No bleeding.  INTEGUMENTARY: No rashes. No lesions.  MUSCULOSKELETAL: No arthritis. No swelling. No gout.  NEUROLOGIC: No numbness, tingling, or ataxia. No seizure-type activity.  PSYCHIATRIC: No anxiety. No insomnia. No ADD.    Vitals:   Vitals:   11/02/17 1030 11/02/17 1100 11/02/17 1200 11/02/17 1305  BP: 138/76 (!) 125/91 122/86 128/82  Pulse: 67 82 61 100  Resp: 18 20 (!) 25   Temp:    97.8 F (36.6 C)  TempSrc:    Oral  SpO2: 96% 93% 93% 96%  Weight:      Height:        Wt Readings from Last 3 Encounters:  11/01/17 169 lb (76.7 kg)  11/01/17 169 lb 12.1 oz (77 kg)  11/01/17 169 lb 12.8 oz (77 kg)     Intake/Output Summary (Last 24 hours) at 11/02/2017 1641 Last data filed at 11/02/2017 1245 Gross  per 24 hour  Intake 512.67 ml  Output 175 ml  Net 337.67 ml    Physical Exam:   GENERAL: Pleasant-appearing in no apparent distress.  HEAD, EYES, EARS, NOSE AND THROAT: Atraumatic, normocephalic. Extraocular muscles are intact. Pupils equal and reactive to light. Sclerae anicteric. No conjunctival injection. No oro-pharyngeal erythema.  NECK: Supple. There is no jugular venous distention. No bruits, no lymphadenopathy, no thyromegaly.  HEART: Irregularly irregular,. No murmurs, no rubs, no clicks.  LUNGS: Clear to auscultation bilaterally. No rales or rhonchi. No wheezes.  ABDOMEN: Soft,  flat, nontender, nondistended. Has good bowel sounds. No hepatosplenomegaly appreciated.  EXTREMITIES: No evidence of any cyanosis, clubbing, or peripheral edema.  +2 pedal and radial pulses bilaterally.  NEUROLOGIC: The patient is alert, awake, and oriented x3 with no focal motor or sensory deficits appreciated bilaterally.  SKIN: Moist and warm with no rashes appreciated.  Psych: Not anxious, depressed LN: No inguinal LN enlargement    Antibiotics   Anti-infectives (From admission, onward)   None      Medications   Scheduled Meds: . aspirin EC  81 mg Oral Daily  . enoxaparin (LOVENOX) injection  1 mg/kg Subcutaneous Q12H  . metoprolol tartrate  25 mg Oral Q6H  . sodium chloride flush  3 mL Intravenous Q12H   Continuous Infusions: . sodium chloride    . amiodarone 30 mg/hr (11/02/17 1200)   PRN Meds:.sodium chloride, acetaminophen **OR** acetaminophen, alum & mag hydroxide-simeth, bisacodyl, ondansetron **OR** ondansetron (ZOFRAN) IV, senna-docusate, sodium chloride flush   Data Review:   Micro Results Recent Results (from the past 240 hour(s))  MRSA PCR Screening     Status: None   Collection Time: 11/01/17  8:13 PM  Result Value Ref Range Status   MRSA by PCR NEGATIVE NEGATIVE Final    Comment:        The GeneXpert MRSA Assay (FDA approved for NASAL specimens only), is one  component of a comprehensive MRSA colonization surveillance program. It is not intended to diagnose MRSA infection nor to guide or monitor treatment for MRSA infections. Performed at [facility], [address] [place], [place] [address]     Radiology Reports Dg Chest 2 View  Result Date: 11/01/2017 CLINICAL DATA:  Atrial fibrillation EXAM: CHEST  2 VIEW COMPARISON:  10/07/2014 FINDINGS: Moderate left pleural effusion. Trace right pleural effusion. No pneumothorax. Bilateral diffuse mild interstitial thickening. Stable cardiomegaly. Thoracic aortic atherosclerosis. Total reverse left shoulder arthroplasty. IMPRESSION: 1. Findings concerning for mild CHF. Moderate left pleural effusion. Electronically Signed   By: Elige KoHetal  Lanecia Sliva   On: 11/01/2017 13:23   Dg Chest Port 1 View  Result Date: 11/02/2017 CLINICAL DATA:  84 year old female with arrhythmia. EXAM: PORTABLE CHEST 1 VIEW COMPARISON:  Chest radiograph dated 11/01/2017 FINDINGS: Left pleural effusion and left lung base atelectasis versus infiltrate similar to prior radiograph. Probable small right pleural effusion. No pneumothorax. Stable cardiomegaly. Atherosclerotic calcification of the aortic arch. Osteopenia with degenerative changes of the spine and shoulders. Left shoulder hemiarthroplasty. No acute osseous pathology. IMPRESSION: Stable left-sided pleural effusion and left lung base atelectasis/infiltrate as well as stable cardiomegaly. Overall no interval change in the appearance of the lungs compared to prior radiograph. Clinical correlation and follow-up recommended. Electronically Signed   By: Elgie CollardArash  Radparvar M.D.   On: 11/02/2017 03:17     CBC Recent Labs  Lab 11/01/17 1216 11/02/17 0331  WBC 5.4 6.8  HGB 13.5 13.2  HCT 41.2 39.5  PLT 210 211  MCV 88.3 88.2  MCH 29.0 29.4  MCHC 32.8 33.4  RDW 14.1 14.1    Chemistries  Recent Labs  Lab 11/01/17 1216 11/02/17 0523  NA 141 139  K 3.6 4.0  CL 102 105   CO2 27 25  GLUCOSE 107* 117*  BUN 19 22*  CREATININE 0.75 1.06*  CALCIUM 10.0 9.1  MG 1.8 1.9   ------------------------------------------------------------------------------------------------------------------ estimated creatinine clearance is 37.2 mL/min (A) (by C-G formula based on SCr of 1.06 mg/dL (H)). ------------------------------------------------------------------------------------------------------------------ No results for input(s): HGBA1C in the last 72 hours. ------------------------------------------------------------------------------------------------------------------ No  results for input(s): CHOL, HDL, LDLCALC, TRIG, CHOLHDL, LDLDIRECT in the last 72 hours. ------------------------------------------------------------------------------------------------------------------ No results for input(s): TSH, T4TOTAL, T3FREE, THYROIDAB in the last 72 hours.  Invalid input(s): FREET3 ------------------------------------------------------------------------------------------------------------------ No results for input(s): VITAMINB12, FOLATE, FERRITIN, TIBC, IRON, RETICCTPCT in the last 72 hours.  Coagulation profile No results for input(s): INR, PROTIME in the last 168 hours.  No results for input(s): DDIMER in the last 72 hours.  Cardiac Enzymes Recent Labs  Lab 11/01/17 1216 11/01/17 2028 11/02/17 0523  TROPONINI <0.03 0.03* <0.03   ------------------------------------------------------------------------------------------------------------------ Invalid input(s): POCBNP    Assessment & Plan  Patient is a 84 year old admitted with A. fib with RVR  1. A. fib with RVR Due to low blood pressure and elevated heart rate she was started on amiodarone drip Now heart rate much improved Awake echocardiogram of the heart Appreciate cardiology input 1 dose of digoxin was given Full dose Lovenox  2.  Hypotension related to A. fib with RVR now much better  3.   Possible CHF based on chest x-ray however due to low blood pressure Lasix await echocardiogram  4.GERD.    Continue Protonix        Code Status Orders  (From admission, onward)        Start     Ordered   11/01/17 1721  Full code  Continuous     11/01/17 1720    Code Status History    Date Active Date Inactive Code Status Order ID Comments User Context   This patient has a current code status but no historical code status.    Advance Directive Documentation     Most Recent Value  Type of Advance Directive  Healthcare Power of Attorney  Pre-existing out of facility DNR order (yellow form or pink MOST form)  No data  "MOST" Form in Place?  No data           Consults cardiology  DVT Prophylaxis  Lovenox  Lab Results  Component Value Date   PLT 211 11/02/2017     Time Spent in minutes   35 minutes  Greater than 50% of time spent in care coordination and counseling patient regarding the condition and plan of care.   Auburn Bilberry M.D on 11/02/2017 at 4:41 PM  Between 7am to 6pm - Pager - [phone]  After 6pm go to [email or web address] - password [email or web address] [facility]  [facility]   Office  [phone]

## 2017-11-02 NOTE — Progress Notes (Signed)
ANTICOAGULATION CONSULT NOTE - Follow Up Consult  Pharmacy Consult for Lovenox (enoxaparin) dosing. Indication: Afib with RVR.  Allergies  Allergen Reactions  . Amlodipine Other (See Comments)    malaise malaise  . Amoxicillin-Pot Clavulanate     Other reaction(s): Other (See Comments) Cannot remember.  . Penicillins     Has patient had a PCN reaction causing immediate rash, facial/tongue/throat swelling, SOB or lightheadedness with hypotension: Unknown Has patient had a PCN reaction causing severe rash involving mucus membranes or skin necrosis: Unknown Has patient had a PCN reaction that required hospitalization: Unknown Has patient had a PCN reaction occurring within the last 10 years: No If all of the above answers are "NO", then may proceed with Cephalosporin use.  . Codeine Rash    Patient Measurements: Height: 4\' 11"  (149.9 cm) Weight: 169 lb (76.7 kg) IBW/kg (Calculated) : 43.2   Vital Signs: Temp: 98.3 F (36.8 C) (01/16 0805) Temp Source: Oral (01/16 0805) BP: 122/86 (01/16 1200) Pulse Rate: 61 (01/16 1200)  Labs: Recent Labs    11/01/17 1216 11/01/17 2028 11/02/17 0331 11/02/17 0523  HGB 13.5  --  13.2  --   HCT 41.2  --  39.5  --   PLT 210  --  211  --   CREATININE 0.75  --   --  1.06*  TROPONINI <0.03 0.03*  --  <0.03    Estimated Creatinine Clearance: 37.2 mL/min (A) (by C-G formula based on SCr of 1.06 mg/dL (H)).    Assessment: 84 yo F with Afib with RVR taking Lovenox 1 mg/kg q12h. Patient takes aspirin at home.  Goal of Therapy:  Monitor platelets by anticoagulation protocol: Yes.    Plan:  Continue with Lovenox 1 mg/kg q12h Change to Lovenox 1 mg/kg q24 hr if CrCl < 30 mL/min   Cheryl Winters 11/02/2017,1:03 PM

## 2017-11-02 NOTE — Consult Note (Signed)
Cardiology Consultation:   Patient ID: Dorlis Judice [facility]; [id number]; Nov 04, 1933   Admit date: 11/01/2017 Date of Consult: 11/02/2017  Primary Care Provider: Dale Eighty Four, MD Primary Cardiologist: Kirke Corin   Patient Profile:   Cheryl Winters is a 84 y.o. female with a hx of presyncope with bradycardia, mixed Alzheimer's/vascular dementia, HTN, HLD, essential tremor, GERD, arthritis and frequent UTI who is being seen today for the evaluation of new onset Afib with RVR at the request of Dr. Imogene Burn.  History of Present Illness:   Cheryl Winters was previously seen in 2016 in the setting of volume depletion and UTI. SHe was noted to have a heart rate in the 50s bpm on propranolol for essential tremor. Holter monitor showed NSR with short runs of SVT with occasional sinus bradycardia. Mean heart rate in the 60s bpm with a minimum heart rate of 47 bpm at 3 AM. She was tapered off propranolol with improvement in heart rates. Echo in 2016 showed an EF of 50-55%, mild to moderate mitral regurgitation with mild tricuspid regurgitation, no aortic stenosis. Prior carotid ultrasound with no evidence of obstructive disease. Nuclear stress test in 10/2015 showed not evidence of ischemia, EF 62%, low risk study. She was seen most recently by Dr. Kirke Corin in 08/2016 for routine follow up and was doing well at that time. EKG showed sinus rhythm. Noted to have some dizziness and possible orthostatic hypotension in 09/2016. Saw neurology. MRI brain not acute.   Has been noting intermittent dizziness, palpitations, and fatigue dating back to 08/2017 per patient and her daughter. She was seeing her PCP for this on 11/01/17 when she was noted to be tachycardic with EKG showing new onset Afib with RVR, 142 bpm, no acute st/t changes. She was sent to [facility] ED for further evaluation. Upon her arrival to [facility] she was noted to be in Afib with RVR, 140s bpm. She was given IV diltiazem injections x 2 with minimal heart rate response.  She was then placed on a diltiazem gtt with PO metoprolol. Her blood pressures dropped into the 80s systolic intitially, then to a low of 58/11. Rapid response was called. She was given IV fluids and dilt gtt was stopped. She was transferred to the ICU and started on amiodarone gtt for rate control. IM has started her on full-dose Lovenox. Echo is pending. Troponin peaked at 0.03. Magnesium and potassium at goal. CBC unremarkable. Remains in Afib with improved heart rates into the low 100s to 120s this AM.   Past Medical History:  Diagnosis Date  . Abdominal pain, right upper quadrant   . Abnormal stress test    w/fixed defect  . Benign essential tremor   . Carpal tunnel syndrome   . DJD (degenerative joint disease) of knee   . GERD (gastroesophageal reflux disease)   . HTN (hypertension)   . Hyperlipidemia   . Syncope and collapse   . Ulnar nerve lesion   . UTI (urinary tract infection)   . Vitamin D deficiency     Past Surgical History:  Procedure Laterality Date  . Bilateral Bletharoplastices    . BREAST BIOPSY  1957 and 1962   x 2, Nml per pt  . BREAST CYST EXCISION     x 2 - nml path per pt  . CARPAL TUNNEL RELEASE  2004  . CATARACT EXTRACTION    . TOTAL KNEE ARTHROPLASTY     bilateral, total, Marcy Panning  . TOTAL SHOULDER REPLACEMENT  08/2010  left  . TUBAL LIGATION    . UMBILICAL HERNIA REPAIR    . VAGINAL DELIVERY     1 set Twins/2 single births     Home Meds: Prior to Admission medications   Medication Sig Start Date End Date Taking? Authorizing Provider  aspirin EC 81 MG tablet Take 81 mg by mouth daily.   Yes [provider]  Biotin 5000 MCG TABS Take 5,000 mcg by mouth daily.    Yes [provider]  cholecalciferol (VITAMIN D) 1000 units tablet Take 1,000 Units by mouth daily.   Yes [provider]  hydrochlorothiazide (HYDRODIURIL) 25 MG tablet Take 1 tablet (25 mg total) by mouth daily. 04/14/17  Yes Dale DurhamScott, Charlene, MD    lisinopril (PRINIVIL,ZESTRIL) 40 MG tablet Take 1 tablet (40 mg total) by mouth daily. 04/14/17  Yes Dale DurhamScott, Charlene, MD  Melatonin 5 MG TABS Take 5 mg by mouth at bedtime.    Yes [provider]  Multiple Vitamins-Minerals (MULTIVITAMIN WITH MINERALS) tablet Take 1 tablet by mouth daily.   Yes [provider]  omega-3 acid ethyl esters (LOVAZA) 1 g capsule Take 1 g by mouth daily.   Yes [provider]  Ubiquinol 200 MG CAPS Take 200 mg by mouth daily.    Yes [provider]    Inpatient Medications: Scheduled Meds: . aspirin EC  81 mg Oral Daily  . enoxaparin (LOVENOX) injection  1 mg/kg Subcutaneous Q12H  . hydrochlorothiazide  25 mg Oral Daily  . lisinopril  40 mg Oral Daily  . metoprolol tartrate  25 mg Oral BID  . pantoprazole (PROTONIX) IV  40 mg Intravenous Q24H  . sodium chloride flush  3 mL Intravenous Q12H   Continuous Infusions: . sodium chloride    . amiodarone 60 mg/hr (11/02/17 0700)  . amiodarone    . diltiazem (CARDIZEM) infusion Stopped (11/01/17 1915)   PRN Meds: sodium chloride, acetaminophen **OR** acetaminophen, albuterol, alum & mag hydroxide-simeth, bisacodyl, ondansetron **OR** ondansetron (ZOFRAN) IV, senna-docusate, sodium chloride flush  Allergies:   Allergies  Allergen Reactions  . Amlodipine Other (See Comments)    malaise malaise  . Amoxicillin-Pot Clavulanate     Other reaction(s): Other (See Comments) Cannot remember.  . Penicillins     Has patient had a PCN reaction causing immediate rash, facial/tongue/throat swelling, SOB or lightheadedness with hypotension: Unknown Has patient had a PCN reaction causing severe rash involving mucus membranes or skin necrosis: Unknown Has patient had a PCN reaction that required hospitalization: Unknown Has patient had a PCN reaction occurring within the last 10 years: No If all of the above answers are "NO", then may proceed with Cephalosporin use.  . Codeine Rash     Social History:   Social History   Socioeconomic History  . Marital status: Widowed    Spouse name: Not on file  . Number of children: 4  . Years of education: Not on file  . Highest education level: Not on file  Social Needs  . Financial resource strain: Not hard at all  . Food insecurity - worry: Never true  . Food insecurity - inability: Never true  . Transportation needs - medical: No  . Transportation needs - non-medical: No  Occupational History  . 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 Smoking status: Never Smoker  . Smokeless tobacco: Never Used  Substance and Sexual Activity  . Alcohol use: No  . Drug use: No  . Sexual activity: Not Currently  Other Topics Concern  . Not on file  Social History Narrative   Lives alone in [place]. Widow. Has children who live nearby.     Family History:  Family History  Problem Relation Age of Onset  . Hypertension Mother   . Arthritis Mother   . Stroke Mother   . Dementia Mother   . Early death Father 38       from brain cancer  . Brain cancer Father 79  . Breast cancer Maternal Aunt   . Alcohol abuse Other     ROS:  Review of Systems  Constitutional: Positive for malaise/fatigue. Negative for chills, diaphoresis, fever and weight loss.  HENT: Negative for congestion.   Eyes: Negative for discharge and redness.  Respiratory: Positive for cough and shortness of breath. Negative for hemoptysis, sputum production and wheezing.   Cardiovascular: Positive for palpitations and leg swelling. Negative for chest pain, orthopnea, claudication and PND.  Gastrointestinal: Positive for heartburn and nausea. Negative for abdominal pain, blood in stool, melena and vomiting.  Genitourinary: Positive for dysuria, frequency and urgency. Negative for hematuria.  Musculoskeletal: Negative for falls and myalgias.  Skin: Negative for rash.  Neurological: Positive for dizziness and weakness. Negative for tingling,  tremors, sensory change, speech change, focal weakness and loss of consciousness.  Endo/Heme/Allergies: Does not bruise/bleed easily.  Psychiatric/Behavioral: Negative for substance abuse. The patient is not nervous/anxious.   All other systems reviewed and are negative.     Physical Exam/Data:   Vitals:   11/02/17 0400 11/02/17 0500 11/02/17 0600 11/02/17 0700  BP: (!) 87/60 (!) 116/103  120/77  Pulse: (!) 41 (!) 38 (!) 39 69  Resp: (!) 23 (!) 28  19  Temp:      TempSrc:      SpO2: 95% 95% 93% 96%  Weight:      Height:        Intake/Output Summary (Last 24 hours) at 11/02/2017 0752 Last data filed at 11/02/2017 0700 Gross per 24 hour  Intake 390.44 ml  Output 75 ml  Net 315.44 ml   Filed Weights   11/01/17 1213  Weight: 169 lb (76.7 kg)   Body mass index is 34.13 kg/m.   Physical Exam: General: Well developed, well nourished, in no acute distress. Resting tremor noted.  Head: Normocephalic, atraumatic, sclera non-icteric, no xanthomas, nares without discharge.  Neck: Negative for carotid bruits. JVD mildly elevated. Lungs: Diminished breath sounds along the left base. Breathing is unlabored. Heart: Mildly tachycardic, irregularly irregular with S1 S2. III/VI systolic murmur at the apex, no rubs, or gallops appreciated. Abdomen: Soft, non-tender, non-distended with normoactive bowel sounds. No hepatomegaly. No rebound/guarding. No obvious abdominal masses. Msk:  Strength and tone appear normal for age. Extremities: No clubbing or cyanosis. 1+ bilateral pitting LE edema to the mid shins. Distal pedal pulses are 2+ and equal bilaterally. Neuro: Alert and oriented X 3. No facial asymmetry. No focal deficit. Moves all extremities spontaneously. Psych:  Responds to questions appropriately with a normal affect.   EKG:  The EKG was personally reviewed and demonstrates: 11/01/17 11:00 AM - Afib with RVR, 140 bpm, nonspecific st/t changes. EKG 11/01/17 12:05 - Afib with RVR, 142  bpm, occasional PVC, nonspecific st/t changes. EKG 11/01/17 20:03 - Afib, 69 bpm, nonspecific st/t changes Telemetry:  Telemetry was personally reviewed and demonstrates: Afib with RVR with heart rates mostly in the low 100s to 130s bpm. Heart rates currently in the 90s to 120s bpm, in Afib.   Weights:  Filed Weights   11/01/17 1213  Weight: 169 lb (76.7 kg)    Relevant CV Studies:  Myoview 2017: No significant ischemia, EF 62%, no EKG changes concerning for ischemia, low risk scan.  Holter 2016: NSR with short runs of SVT with occasional sinus bradycardia, mean heart rate in the 60s bpm, minimum heart rate of 47 bpm at 3 AM.   TTE 2016: EF 50-55%, mild to moderate mitral regurgitation, mild tricuspid regurgitation, no significant aortic stenosis.    Laboratory Data:  Chemistry Recent Labs  Lab 11/01/17 1216 11/02/17 0523  NA 141 139  K 3.6 4.0  CL 102 105  CO2 27 25  GLUCOSE 107* 117*  BUN 19 22*  CREATININE 0.75 1.06*  CALCIUM 10.0 9.1  GFRNONAA >60 48*  GFRAA >60 56*  ANIONGAP 12 9    No results for input(s): PROT, ALBUMIN, AST, ALT, ALKPHOS, BILITOT in the last 168 hours. Hematology Recent Labs  Lab 11/01/17 1216 11/02/17 0331  WBC 5.4 6.8  RBC 4.67 4.48  HGB 13.5 13.2  HCT 41.2 39.5  MCV 88.3 88.2  MCH 29.0 29.4  MCHC 32.8 33.4  RDW 14.1 14.1  PLT 210 211   Cardiac Enzymes Recent Labs  Lab 11/01/17 1216 11/01/17 2028 11/02/17 0523  TROPONINI <0.03 0.03* <0.03   No results for input(s): TROPIPOC in the last 168 hours.  BNP Recent Labs  Lab 11/01/17 1216  BNP 366.0*    DDimer No results for input(s): DDIMER in the last 168 hours.  Radiology/Studies:  Dg Chest 2 View  Result Date: 11/01/2017 IMPRESSION: 1. Findings concerning for mild CHF. Moderate left pleural effusion. Electronically Signed   By: Elige Ko   On: 11/01/2017 13:23   Dg Chest Port 1 View  Result Date: 11/02/2017 IMPRESSION: Stable left-sided pleural effusion and left lung  base atelectasis/infiltrate as well as stable cardiomegaly. Overall no interval change in the appearance of the lungs compared to prior radiograph. Clinical correlation and follow-up recommended. Electronically Signed   By: Elgie Collard M.D.   On: 11/02/2017 03:17    Assessment and Plan:   1. New onset Afib with RVR with possible tachy-brady syndrome complicated by iatrogenic hypotension: -Remains in Afib with improving heart rates, though ventricular rates remain mildly tachycardic -Has history of sinus bradycardia on low dose propranolol noted in 2016 with rate of 47 bpm on Holter monitor (3 AM) leading to discontinuation of beta blocker and improvement in heart rate -Noted to be in new onset Afib with RVR at PCP office on 11/01/17. Sent to [facility] for further evaluation and treatment. Received IV diltiazem injections followed by initiation of diltiazem gtt and PO metoprolol. Patient's BP dropped to a low of 58/11. RR was called. She was given IV fluid bolus of 250 mL and diltiazem gtt was stopped. She was transferred to the ICU and started on amiodarone infusion for rate-control.  -Ventricular rates are overall significantly improved to the low 100s to 120s bpm, remains in Afib -We cannot say for certain when she went into Afib as she has noted intermittent dizziness, palpitations, and fatigue dating back to 08/2017 -BP in the low 100s systolic currently, which precludes titration of PO metoprolol that is currently ordered by IM. I also worry about her history of bradycardia on low-dose propranolol -Her softer BP may be in the setting of her amiodarone infusion. Given we cannot document if her Afib has been paroxysmal dating back to her symptoms in 08/2017, would favor discontinuation of amiodarone  gtt as she has not previously been on anticoagulation -Will given a one-time dose of digoxin this AM of 0.5 mg for rate control, discussed with MD -May need to decrease PO metoprolol if pressures remain  low -If ventricular rates remain difficult to control and pressures remain on the softer side she may require TEE/DCCV prior to discharge, otherwise would pursue DCCV as an outpatient after she has been adequately anticoagulated x ~ 3 weeks -Continue full dose Lovenox for anticoagulation until she has been fully ruled out and echo performed -Would transition to DOAC prior to discharge with Eliquis or Xarelto given her CHADS2VASc of at least 5 (HTN, age x 2, vascular disease, female) -Replete magnesium to > 2.0 -Check TSH -Potassium at goal -Snores, will need outpatient sleep study  2. Pleural effusion: -Likely rate/rhythm related -Noted to have a left pleural effusion on CXR in the ED -IV Lasix -Echo -May need thoracentesis if becomes symptomatic   3. Elevated troponin: -Minimally elevated at 0.03, subsequent value negative -Likely supply demand ischemia 2/2 rate and volume overload  4. AKI: -Likely ATN from hypotension as noted above -Monitor   For questions or updates, please contact [facility] Please consult [email or web address] for contact info under Cardiology/STEMI.   Signed, Eula Listen, PA-C [facility] Pager: [phone] 11/02/2017, 7:52 AM

## 2017-11-02 NOTE — Progress Notes (Signed)
PHARMACIST - PHYSICIAN COMMUNICATION  CONCERNING: IV to Oral Route Change Policy  RECOMMENDATION: This patient is receiving pantoprazole by the intravenous route.  Based on criteria approved by the Pharmacy and Therapeutics Committee, the intravenous medication(s) is/are being converted to the equivalent oral dose form(s).   DESCRIPTION: These criteria include:  The patient is eating (either orally or via tube) and/or has been taking other orally administered medications for a least 24 hours  The patient has no evidence of active gastrointestinal bleeding or impaired GI absorption (gastrectomy, short bowel, patient on TNA or NPO).  If you have questions about this conversion, please contact the Pharmacy Department  []   [phone]( [phone] )  [facility] [x]   [phone]( [phone] )  [facility] []   [phone]( [phone] )  [facility] []   [phone]  [facility] []   [phone]( [phone] )  [facility]   Sakai Wolford L, [facility] Institute[facility]PH 11/02/2017 9:01 AM

## 2017-11-02 NOTE — Care Management Note (Signed)
Case Management Note  Patient Details  Name: Geanie LoganRita Steele Beltre MRN: [id number] Date of Birth: July 14, 1934  Subjective/Objective:                 Sent to ED from PCP office for atrial fib.  Placed on cardizem drip then experienced hypotension and transferred to icu stepdown.  Transferred back to telemetry today.   Action/Plan:  Requested physical therapy when medically stable  Expected Discharge Date:                  Expected Discharge Plan:     In-House Referral:     Discharge planning Services     Post Acute Care Choice:    Choice offered to:     DME Arranged:    DME Agency:     HH Arranged:    HH Agency:     Status of Service:     If discussed at [employer] Meetings, dates discussed:    Additional Comments:  Eber HongGreene, Vi Whitesel R, RN 11/02/2017, 3:14 PM

## 2017-11-03 ENCOUNTER — Inpatient Hospital Stay (HOSPITAL_COMMUNITY)
Admit: 2017-11-03 | Discharge: 2017-11-03 | Disposition: A | Discharge status: Home / Self Care | Payer: Medicare Other | Attending: Internal Medicine | Admitting: Internal Medicine

## 2017-11-03 DIAGNOSIS — G309 Alzheimer's disease, unspecified: Secondary | ICD-10-CM

## 2017-11-03 DIAGNOSIS — I420 Dilated cardiomyopathy: Secondary | ICD-10-CM

## 2017-11-03 DIAGNOSIS — F0390 Unspecified dementia without behavioral disturbance: Secondary | ICD-10-CM

## 2017-11-03 DIAGNOSIS — F0280 Dementia in other diseases classified elsewhere without behavioral disturbance: Secondary | ICD-10-CM

## 2017-11-03 DIAGNOSIS — I4891 Unspecified atrial fibrillation: Secondary | ICD-10-CM

## 2017-11-03 LAB — CBC
HCT: 40.1 % (ref 35.0–47.0)
HEMOGLOBIN: 13.1 g/dL (ref 12.0–16.0)
MCH: 28.9 pg (ref 26.0–34.0)
MCHC: 32.7 g/dL (ref 32.0–36.0)
MCV: 88.4 fL (ref 80.0–100.0)
PLATELETS: 186 10*3/uL (ref 150–440)
RBC: 4.54 MIL/uL (ref 3.80–5.20)
RDW: 14 % (ref 11.5–14.5)
WBC: 5.6 10*3/uL (ref 3.6–11.0)

## 2017-11-03 LAB — BASIC METABOLIC PANEL
ANION GAP: 9 (ref 5–15)
BUN: 22 mg/dL — ABNORMAL HIGH (ref 6–20)
CALCIUM: 9 mg/dL (ref 8.9–10.3)
CO2: 24 mmol/L (ref 22–32)
CREATININE: 1.15 mg/dL — AB (ref 0.44–1.00)
Chloride: 101 mmol/L (ref 101–111)
GFR calc Af Amer: 50 mL/min — ABNORMAL LOW (ref >60)
GFR, EST NON AFRICAN AMERICAN: 43 mL/min — AB (ref >60)
GLUCOSE: 102 mg/dL — AB (ref 65–99)
Potassium: 3.7 mmol/L (ref 3.5–5.1)
Sodium: 134 mmol/L — ABNORMAL LOW (ref 135–145)

## 2017-11-03 LAB — ECHOCARDIOGRAM COMPLETE
HEIGHTINCHES: 59 in
Weight: 2704 oz

## 2017-11-03 MED ORDER — POTASSIUM CHLORIDE CRYS ER 20 MEQ PO TBCR
20.0000 meq | EXTENDED_RELEASE_TABLET | Freq: Every day | ORAL | Status: DC
  Administered 2017-11-03 – 2017-11-04 (×2): 20 meq via ORAL
  Filled 2017-11-03 (×2): qty 1

## 2017-11-03 MED ORDER — MELATONIN 5 MG PO TABS
1.0000 | ORAL_TABLET | Freq: Every day | ORAL | Status: DC
  Administered 2017-11-03: 5 mg via ORAL
  Filled 2017-11-03 (×2): qty 1

## 2017-11-03 MED ORDER — APIXABAN 5 MG PO TABS
5.0000 mg | ORAL_TABLET | Freq: Two times a day (BID) | ORAL | Status: DC
Start: 2017-11-03 — End: 2017-11-04
  Administered 2017-11-03 – 2017-11-04 (×2): 5 mg via ORAL
  Filled 2017-11-03 (×2): qty 1

## 2017-11-03 MED ORDER — FUROSEMIDE 40 MG PO TABS
40.0000 mg | ORAL_TABLET | Freq: Every day | ORAL | Status: DC
  Administered 2017-11-03 – 2017-11-04 (×2): 40 mg via ORAL
  Filled 2017-11-03 (×2): qty 1

## 2017-11-03 MED ORDER — METOPROLOL TARTRATE 50 MG PO TABS
50.0000 mg | ORAL_TABLET | Freq: Two times a day (BID) | ORAL | Status: DC
  Administered 2017-11-03: 50 mg via ORAL
  Filled 2017-11-03: qty 1

## 2017-11-03 NOTE — Progress Notes (Signed)
Progress Note  Patient Name: Cheryl Winters Date of Encounter: 11/03/2017  [facility]  Subjective   Family at the bedside,  Long discussion concerning need for assistance at home She has dementia, often noncompliant with her pills, memory problems Previous history of falls, gait instability She is still driving, neighbors very concerned about her using a vehicle as is family Primary care doctor Cheryl Winters, also seen by Dr. Malvin Winters Telemetry reviewed atrial fibrillation rate 90-100 Blood pressure stable, tolerating metoprolol 25 every 6hr.  Also on amiodarone infusion     Inpatient Medications    Scheduled Meds: . aspirin EC  81 mg Oral Daily  . enoxaparin (LOVENOX) injection  1 mg/kg Subcutaneous Q12H  . metoprolol tartrate  25 mg Oral Q6H  . sodium chloride flush  3 mL Intravenous Q12H   Continuous Infusions: . sodium chloride    . amiodarone 30 mg/hr (11/03/17 0828)   PRN Meds: sodium chloride, acetaminophen **OR** acetaminophen, alum & mag hydroxide-simeth, bisacodyl, ondansetron **OR** ondansetron (ZOFRAN) IV, senna-docusate, sodium chloride flush   Vital Signs    Vitals:   11/02/17 2037 11/03/17 0404 11/03/17 0614 11/03/17 0751  BP:  (!) 155/97 (!) 151/83 (!) 139/96  Pulse: (!) 104 77 94 (!) 142  Resp:  17  20  Temp:  98.4 F (36.9 C)  (!) 97.3 F (36.3 C)  TempSrc:    Oral  SpO2:  91% 91% (!) 89%  Weight:      Height:        Intake/Output Summary (Last 24 hours) at 11/03/2017 1158 Last data filed at 11/03/2017 1046 Gross per 24 hour  Intake 772.26 ml  Output 1100 ml  Net -327.74 ml   Filed Weights   11/01/17 1213  Weight: 169 lb (76.7 kg)    Telemetry    Atrial fibrillation ventricular rate 90 bpm rsonally Reviewed  ECG     Physical Exam   GEN: No acute distress.   Neck: No JVD Cardiac:   irregularly irregular  no murmurs, rubs, or gallops.  Respiratory: dullness at the bases , scattered rails  GI: Soft, nontender,  non-distended  MS: No edema; No deformity. Neuro:  Nonfocal  Psych: Normal affect   Labs    Chemistry Recent Labs  Lab 11/01/17 1216 11/02/17 0523 11/03/17 0621  NA 141 139 134*  K 3.6 4.0 3.7  CL 102 105 101  CO2 27 25 24   GLUCOSE 107* 117* 102*  BUN 19 22* 22*  CREATININE 0.75 1.06* 1.15*  CALCIUM 10.0 9.1 9.0  GFRNONAA >60 48* 43*  GFRAA >60 56* 50*  ANIONGAP 12 9 9      Hematology Recent Labs  Lab 11/01/17 1216 11/02/17 0331 11/03/17 0621  WBC 5.4 6.8 5.6  RBC 4.67 4.48 4.54  HGB 13.5 13.2 13.1  HCT 41.2 39.5 40.1  MCV 88.3 88.2 88.4  MCH 29.0 29.4 28.9  MCHC 32.8 33.4 32.7  RDW 14.1 14.1 14.0  PLT 210 211 186    Cardiac Enzymes Recent Labs  Lab 11/01/17 1216 11/01/17 2028 11/02/17 0523  TROPONINI <0.03 0.03* <0.03   No results for input(s): TROPIPOC in the last 168 hours.   BNP Recent Labs  Lab 11/01/17 1216  BNP 366.0*     DDimer No results for input(s): DDIMER in the last 168 hours.   Radiology    Dg Chest 2 View  Result Date: 11/01/2017 CLINICAL DATA:  Atrial fibrillation EXAM: CHEST  2 VIEW COMPARISON:  10/07/2014 FINDINGS:  Moderate left pleural effusion. Trace right pleural effusion. No pneumothorax. Bilateral diffuse mild interstitial thickening. Stable cardiomegaly. Thoracic aortic atherosclerosis. Total reverse left shoulder arthroplasty. IMPRESSION: 1. Findings concerning for mild CHF. Moderate left pleural effusion. Electronically Signed   By: Elige Ko   On: 11/01/2017 13:23   Dg Chest Port 1 View  Result Date: 11/02/2017 CLINICAL DATA:  84 year old female with arrhythmia. EXAM: PORTABLE CHEST 1 VIEW COMPARISON:  Chest radiograph dated 11/01/2017 FINDINGS: Left pleural effusion and left lung base atelectasis versus infiltrate similar to prior radiograph. Probable small right pleural effusion. No pneumothorax. Stable cardiomegaly. Atherosclerotic calcification of the aortic arch. Osteopenia with degenerative changes of the spine  and shoulders. Left shoulder hemiarthroplasty. No acute osseous pathology. IMPRESSION: Stable left-sided pleural effusion and left lung base atelectasis/infiltrate as well as stable cardiomegaly. Overall no interval change in the appearance of the lungs compared to prior radiograph. Clinical correlation and follow-up recommended. Electronically Signed   By: Elgie Collard M.D.   On: 11/02/2017 03:17    Cardiac Studies   Echocardiogram performed today Left ventricle: The cavity size was normal. Systolic function was moderately to severely reduced. The estimated ejection fraction was in the range of 30% to 35%. Diffuse hypokinesis. Regional wall motion abnormalities cannot be excluded. The study is not   technically sufficient to allow evaluation of LV diastolic   function. - Mitral valve: There was moderate to severe regurgitation. - Left atrium: The atrium was severely dilated. - Right ventricle: The cavity size was moderately dilated. Wall thickness was normal. Systolic function was mildly reduced. - Right atrium: The atrium was mildly dilated. - Tricuspid valve: There was moderate regurgitation. - Pulmonary arteries: Systolic pressure was moderately elevated. PA peak pressure: 55 mm Hg (S). - Moderate sized left pleural effusion noted.  Patient Profile    84 y.o. female  with history of dementia, hypertension, hyperlipidemia, tremor noted to have dizziness, palpitations, fatigue dating back to November 2018 presented to primary care, EKG documenting atrial fibrillation with RVR , shortness breath, malaise, weakness     Assessment & Plan    Atrial fibrillation with RVR Will stop amiodarone infusion Start eliquis 5 twice a day, hold Lovenox Continue digoxin 0.125 mg daily -Metoprolol 75 twice a day hold HCTZ and lisinopril until adequate heart rate  Obtained, then can reassess Moderately depressed ejection fraction, likely exacerbated by atrial fibrillation  Pleural  effusion Secondary to atrial fibrillation with RVR Will work on rate control Recommend we add Lasix 20 mg daily with potassium 10  Dementia Long discussion with family at the bedside Discussed anticoagulation Noncompliant with medication secondary to dementia Will likely need home caretaker Also should not be driving given dementia Will defer to Dr. Lorin Picket, primary care,  and Dr. Malvin Johns   Total encounter time more than 35 minutes  Greater than 50% was spent in counseling and coordination of care with the patient  For questions or updates, please contact [facility] Please consult [email or web address] for contact info under Cardiology/STEMI.      Signed, Julien Nordmann, MD  11/03/2017, 11:58 AM

## 2017-11-03 NOTE — Care Management (Addendum)
Patient now on amiodarone drip instead of cardizem.  If she is able to discharge home, she would benefit from home health nurse, physical therapy and possibly social work for in home community resources, as there is concern being verbalized by family members regarding patient's memory issues. PT consult is pending.  Spoke with daughter Cheryl Winters.  Patient very head strong and independent.  Patient will not use a walker, cane or consider having a med alert.  Daughters are concerned that patient is driving.  Provided resources for inpatient private care.  [facility] has no agency preference for home health.  Referral called and accepted by [facility] for [facility] PT and SW

## 2017-11-03 NOTE — Progress Notes (Signed)
[employer] - [place] [facility]                                                                                                                                                                                  Patient Demographics   Cheryl Winters, is a 84 y.o. female, DOB - 1934/09/09, MVH:[id number]  Admit date - 11/01/2017   Admitting Physician Shaune Pollack, MD  Outpatient Primary MD for the patient is Dale [place], MD   LOS - 2  Subjective:  Patient admitted with A. fib with RVR.  Had to be transferred to the ICU for low blood pressure Feeling better stronger  Review of Systems:   CONSTITUTIONAL: No documented fever. No fatigue, weakness. No weight gain, no weight loss.  EYES: No blurry or double vision.  ENT: No tinnitus. No postnasal drip. No redness of the oropharynx.  RESPIRATORY: No cough, no wheeze, no hemoptysis. No dyspnea.  CARDIOVASCULAR: No chest pain. No orthopnea. No palpitations. No syncope.  GASTROINTESTINAL: No nausea, no vomiting or diarrhea. No abdominal pain. No melena or hematochezia.  GENITOURINARY: No dysuria or hematuria.  ENDOCRINE: No polyuria or nocturia. No heat or cold intolerance.  HEMATOLOGY: No anemia. No bruising. No bleeding.  INTEGUMENTARY: No rashes. No lesions.  MUSCULOSKELETAL: No arthritis. No swelling. No gout.  NEUROLOGIC: No numbness, tingling, or ataxia. No seizure-type activity.  PSYCHIATRIC: No anxiety. No insomnia. No ADD.    Vitals:   Vitals:   11/03/17 0614 11/03/17 0751 11/03/17 1354 11/03/17 1532  BP: (!) 151/83 (!) 139/96 (!) 137/96 (!) 124/91  Pulse: 94 (!) 142 77 (!) 51  Resp:  20  18  Temp:  (!) 97.3 F (36.3 C)    TempSrc:  Oral    SpO2: 91% (!) 89%  91%  Weight:      Height:        Wt Readings from Last 3 Encounters:  11/01/17 169 lb (76.7 kg)  11/01/17 169 lb 12.1 oz (77 kg)  11/01/17 169 lb 12.8 oz (77 kg)     Intake/Output Summary (Last 24 hours) at 11/03/2017 1830 Last data filed at  11/03/2017 1425 Gross per 24 hour  Intake 872.46 ml  Output 1100 ml  Net -227.54 ml    Physical Exam:   GENERAL: Pleasant-appearing in no apparent distress.  HEAD, EYES, EARS, NOSE AND THROAT: Atraumatic, normocephalic. Extraocular muscles are intact. Pupils equal and reactive to light. Sclerae anicteric. No conjunctival injection. No oro-pharyngeal erythema.  NECK: Supple. There is no jugular venous distention. No bruits, no lymphadenopathy, no thyromegaly.  HEART: Irregularly irregular,. No murmurs, no rubs, no clicks.  LUNGS: Clear to auscultation bilaterally. No rales or  rhonchi. No wheezes.  ABDOMEN: Soft, flat, nontender, nondistended. Has good bowel sounds. No hepatosplenomegaly appreciated.  EXTREMITIES: No evidence of any cyanosis, clubbing, or peripheral edema.  +2 pedal and radial pulses bilaterally.  NEUROLOGIC: The patient is alert, awake, and oriented x3 with no focal motor or sensory deficits appreciated bilaterally.  SKIN: Moist and warm with no rashes appreciated.  Psych: Not anxious, depressed LN: No inguinal LN enlargement    Antibiotics   Anti-infectives (From admission, onward)   None      Medications   Scheduled Meds: . aspirin EC  81 mg Oral Daily  . enoxaparin (LOVENOX) injection  1 mg/kg Subcutaneous Q12H  . furosemide  40 mg Oral Daily  . metoprolol tartrate  50 mg Oral BID  . potassium chloride  20 mEq Oral Daily  . sodium chloride flush  3 mL Intravenous Q12H   Continuous Infusions: . sodium chloride     PRN Meds:.sodium chloride, acetaminophen **OR** acetaminophen, alum & mag hydroxide-simeth, bisacodyl, ondansetron **OR** ondansetron (ZOFRAN) IV, senna-docusate, sodium chloride flush   Data Review:   Micro Results Recent Results (from the past 240 hour(s))  MRSA PCR Screening     Status: None   Collection Time: 11/01/17  8:13 PM  Result Value Ref Range Status   MRSA by PCR NEGATIVE NEGATIVE Final    Comment:        The GeneXpert MRSA  Assay (FDA approved for NASAL specimens only), is one component of a comprehensive MRSA colonization surveillance program. It is not intended to diagnose MRSA infection nor to guide or monitor treatment for MRSA infections. Performed at [facility], [address] [place], [place] [address]     Radiology Reports Dg Chest 2 View  Result Date: 11/01/2017 CLINICAL DATA:  Atrial fibrillation EXAM: CHEST  2 VIEW COMPARISON:  10/07/2014 FINDINGS: Moderate left pleural effusion. Trace right pleural effusion. No pneumothorax. Bilateral diffuse mild interstitial thickening. Stable cardiomegaly. Thoracic aortic atherosclerosis. Total reverse left shoulder arthroplasty. IMPRESSION: 1. Findings concerning for mild CHF. Moderate left pleural effusion. Electronically Signed   By: Elige Ko   On: 11/01/2017 13:23   Dg Chest Port 1 View  Result Date: 11/02/2017 CLINICAL DATA:  84 year old female with arrhythmia. EXAM: PORTABLE CHEST 1 VIEW COMPARISON:  Chest radiograph dated 11/01/2017 FINDINGS: Left pleural effusion and left lung base atelectasis versus infiltrate similar to prior radiograph. Probable small right pleural effusion. No pneumothorax. Stable cardiomegaly. Atherosclerotic calcification of the aortic arch. Osteopenia with degenerative changes of the spine and shoulders. Left shoulder hemiarthroplasty. No acute osseous pathology. IMPRESSION: Stable left-sided pleural effusion and left lung base atelectasis/infiltrate as well as stable cardiomegaly. Overall no interval change in the appearance of the lungs compared to prior radiograph. Clinical correlation and follow-up recommended. Electronically Signed   By: Elgie Collard M.D.   On: 11/02/2017 03:17     CBC Recent Labs  Lab 11/01/17 1216 11/02/17 0331 11/03/17 0621  WBC 5.4 6.8 5.6  HGB 13.5 13.2 13.1  HCT 41.2 39.5 40.1  PLT 210 211 186  MCV 88.3 88.2 88.4  MCH 29.0 29.4 28.9  MCHC 32.8 33.4 32.7  RDW 14.1 14.1 14.0     Chemistries  Recent Labs  Lab 11/01/17 1216 11/02/17 0523 11/03/17 0621  NA 141 139 134*  K 3.6 4.0 3.7  CL 102 105 101  CO2 27 25 24   GLUCOSE 107* 117* 102*  BUN 19 22* 22*  CREATININE 0.75 1.06* 1.15*  CALCIUM 10.0 9.1 9.0  MG 1.8 1.9  --    ------------------------------------------------------------------------------------------------------------------ estimated creatinine clearance is 34.3 mL/min (A) (by C-G formula based on SCr of 1.15 mg/dL (H)). ------------------------------------------------------------------------------------------------------------------ No results for input(s): HGBA1C in the last 72 hours. ------------------------------------------------------------------------------------------------------------------ No results for input(s): CHOL, HDL, LDLCALC, TRIG, CHOLHDL, LDLDIRECT in the last 72 hours. ------------------------------------------------------------------------------------------------------------------ No results for input(s): TSH, T4TOTAL, T3FREE, THYROIDAB in the last 72 hours.  Invalid input(s): FREET3 ------------------------------------------------------------------------------------------------------------------ No results for input(s): VITAMINB12, FOLATE, FERRITIN, TIBC, IRON, RETICCTPCT in the last 72 hours.  Coagulation profile No results for input(s): INR, PROTIME in the last 168 hours.  No results for input(s): DDIMER in the last 72 hours.  Cardiac Enzymes Recent Labs  Lab 11/01/17 1216 11/01/17 2028 11/02/17 0523  TROPONINI <0.03 0.03* <0.03   ------------------------------------------------------------------------------------------------------------------ Invalid input(s): POCBNP    Assessment & Plan  Patient is a 84 year old admitted with A. fib with RVR  1. A. fib with RVR Due to low blood pressure and elevated heart rate she was started on amiodarone drip Now heart rate much improved Echocardiogram showed  ejection fraction 30% with systolic dysfunction. Per Dr. Mariah MillingGollan, changed to Lopressor 75 mg twice daily, added digoxin low dose, stopped amiodarone drip.  Hold HCTZ and lisinopril until adequate heart rate control. She was on Full dose Lovenox, changed to Eliquis 5 mg twice daily.  2.  Hypotension related to A. fib with RVR, improved.  3.  Pleural effusion secondary to A. fib with RVR. Start Lasix 20 mg daily with potassium per Dr. Mariah MillingGollan.  4.GERD.    Continue Protonix  Dementia.  Aspiration fall precaution.  PT evaluation.  Discussed with Dr. Mariah MillingGollan.     Code Status Orders  (From admission, onward)        Start     Ordered   11/01/17 1721  Full code  Continuous     11/01/17 1720    Code Status History    Date Active Date Inactive Code Status Order ID Comments User Context   This patient has a current code status but no historical code status.    Advance Directive Documentation     Most Recent Value  Type of Advance Directive  Healthcare Power of Attorney  Pre-existing out of facility DNR order (yellow form or pink MOST form)  No data  "MOST" Form in Place?  No data     Consults cardiology  DVT Prophylaxis  Lovenox  Lab Results  Component Value Date   PLT 186 11/03/2017     Time Spent in minutes   38 minutes  Greater than 50% of time spent in care coordination and counseling patient regarding the condition and plan of care.   Shaune PollackQing Trinidad Ingle M.D on 11/03/2017 at 6:30 PM  Between 7am to 6pm - Pager - [phone]  After 6pm go to [email or web address] - password [email or web address] [facility]  [facility]   Office  [phone]

## 2017-11-03 NOTE — Progress Notes (Signed)
Paged Dr. Allena KatzPatel about patient's family requesting melatonin for tonight, order given. RN will continue to monitor.

## 2017-11-03 NOTE — Care Management Important Message (Signed)
Important Message  Patient Details  Name: Cheryl LoganRita Steele Winters MRN: [id number] Date of Birth: 25-Jul-1934   Medicare Important Message Given:  Yes Signed IM notice given    Eber HongGreene, Jair Lindblad R, RN 11/03/2017, 3:08 PM

## 2017-11-03 NOTE — Plan of Care (Signed)
Atrial fib with controlled rates.  Amiodarone drip continues.

## 2017-11-03 NOTE — Evaluation (Signed)
Physical Therapy Evaluation Patient Details Name: Cheryl Winters MRN: [id number] DOB: 1933/12/12 Today's Date: 11/03/2017   History of Present Illness  presented to ER secondary to dizziness, elevated HR; admitted with afib with RVR, initially requiring cardizem drip (now weaned). Hospital course complicated by transfer to CCU due to hypotension; returned to telemetry.  Clinical Impression  Upon evaluation, patient alert and oriented to self, location; generally confused with marked STM deficits.  Bilat UE/LE strength and ROM grossly symmetrical and WFL; denies pain, chest palpitations/pressure.  Demonstrates ability to complete supine to sit with cga; sit/stand, basic transfers and gait (50-150') with RW, cga.  Optimal gait symmetry, fluidity and overall safety with use of RW.  Do recommend continued use of RW for all mobility at this time.  PAtient/family voiced understanding and agreement. Patient HR stable in 80-90s throughout session with appropriate chronotropic response to activity. Would benefit from skilled PT to address above deficits and promote optimal return to PLOF; Recommend transition to HHPT with 24/7 supervision upon discharge from acute hospitalization.     Follow Up Recommendations Home health PT;Supervision/Assistance - 24 hour(HHOT, aide, RN)    Equipment Recommendations  Rolling walker with 5" wheels(youth RW)    Recommendations for Other Services (psych consult (family concerned over behavioral, paranoia issues))     Precautions / Restrictions Precautions Precautions: Fall Restrictions Weight Bearing Restrictions: No      Mobility  Bed Mobility Overal bed mobility: Needs Assistance Bed Mobility: Supine to Sit     Supine to sit: Min guard        Transfers Overall transfer level: Needs assistance Equipment used: Rolling walker (2 wheeled) Transfers: Sit to/from Stand Sit to Stand: Min guard         General transfer comment: cuing for hand  placement; tends to 'step' L LE under BOS after lifted from seating surface (due to limited knee flexion0  Ambulation/Gait Ambulation/Gait assistance: Min guard Ambulation Distance (Feet): 50 Feet Assistive device: None       General Gait Details: staggered stepping pattern with inconsistent foot placement, intermittently crossing midline; increased sway in all planes.  Geneally unsteady and unsafe to copmlete without +1 hands on assist  Stairs            Wheelchair Mobility    Modified Rankin (Stroke Patients Only)       Balance Overall balance assessment: Needs assistance Sitting-balance support: No upper extremity supported;Feet supported Sitting balance-Leahy Scale: Good     Standing balance support: No upper extremity supported Standing balance-Leahy Scale: Fair                               Pertinent Vitals/Pain Pain Assessment: No/denies pain    Home Living Family/patient expects to be discharged to:: Private residence Living Arrangements: Alone Available Help at Discharge: Family;Available PRN/intermittently Type of Home: House Home Access: Level entry     Home Layout: One level Home Equipment: Cane - single point      Prior Function Level of Independence: Independent         Comments: Indep with ADLs, household and community mobilization; + driving; endorses at least 2-3 falls in previous six months.     Hand Dominance        Extremity/Trunk Assessment   Upper Extremity Assessment Upper Extremity Assessment: Overall WFL for tasks assessed    Lower Extremity Assessment Lower Extremity Assessment: Overall WFL for tasks assessed(grossly at least 4+/5 throughout; L  knee flexion limited to approx 40-50 degrees)       Communication   Communication: HOH  Cognition Arousal/Alertness: Awake/alert Behavior During Therapy: WFL for tasks assessed/performed Overall Cognitive Status: History of cognitive impairments - at baseline                                  General Comments: oriented to self only, follows commands; poor memory (immediate recall 3/3, delayed 1/3) and problem solving      General Comments      Exercises Other Exercises Other Exercises: 150' with RW, cga-improved symmetry, fluidity and overall safety with use of RW; patient verbalizing optimal comfort/confidence with use of rW Other Exercises: 5x sit/stand without assist device, cga/close sup: 15 seconds; slightly impulsive with limited insight into safety needs Other Exercises: Extensive discussion with patient/family regarding recommendations and need for 24/7 supervision due to cognitive deficits. Family aware and interested in providing incrased support; also interested in considering transition to LTC.  Discussed with RNCM-to provide community/caregiver resources to family   Assessment/Plan    PT Assessment Patient needs continued PT services  PT Problem List Decreased strength;Decreased range of motion;Decreased activity tolerance;Decreased balance;Decreased mobility;Decreased cognition;Decreased knowledge of use of DME;Decreased safety awareness;Decreased knowledge of precautions       PT Treatment Interventions DME instruction;Gait training;Functional mobility training;Stair training;Therapeutic activities;Therapeutic exercise;Balance training;Patient/family education;Cognitive remediation    PT Goals (Current goals can be found in the Care Plan section)  Acute Rehab PT Goals Patient Stated Goal: per family, to get increased support in the home PT Goal Formulation: With patient/family Time For Goal Achievement: 11/17/17 Potential to Achieve Goals: Good    Frequency Min 2X/week   Barriers to discharge Decreased caregiver support      Co-evaluation               AM-PAC PT "6 Clicks" Daily Activity  Outcome Measure Difficulty turning over in bed (including adjusting bedclothes, sheets and blankets)?:  Unable Difficulty moving from lying on back to sitting on the side of the bed? : Unable Difficulty sitting down on and standing up from a chair with arms (e.g., wheelchair, bedside commode, etc,.)?: Unable Help needed moving to and from a bed to chair (including a wheelchair)?: A Little Help needed walking in hospital room?: A Little Help needed climbing 3-5 steps with a railing? : A Little 6 Click Score: 12    End of Session Equipment Utilized During Treatment: Gait belt Activity Tolerance: Patient tolerated treatment well Patient left: in bed;with bed alarm set;with family/visitor present Nurse Communication: Mobility status PT Visit Diagnosis: Unsteadiness on feet (R26.81);History of falling (Z91.81);Difficulty in walking, not elsewhere classified (R26.2)    Time: [id number] PT Time Calculation (min) (ACUTE ONLY): 47 min   Charges:   PT Evaluation $PT Eval Moderate Complexity: 1 Mod PT Treatments $Gait Training: 8-22 mins $Therapeutic Activity: 8-22 mins   PT G Codes:       Lylia Karn H. Manson PasseyBrown, PT, DPT, NCS 11/03/17, 11:07 PM [phone]

## 2017-11-03 NOTE — Progress Notes (Signed)
*  PRELIMINARY RESULTS* Echocardiogram 2D Echocardiogram has been performed.  Cheryl Winters, Cheryl Winters 11/03/2017, 9:52 AM

## 2017-11-03 NOTE — Progress Notes (Signed)
ANTICOAGULATION CONSULT NOTE - Initial Consult  Pharmacy Consult for apixaban Indication: atrial fibrillation  Allergies  Allergen Reactions  . Amlodipine Other (See Comments)    malaise malaise  . Amoxicillin-Pot Clavulanate     Other reaction(s): Other (See Comments) Cannot remember.  . Penicillins     Has patient had a PCN reaction causing immediate rash, facial/tongue/throat swelling, SOB or lightheadedness with hypotension: Unknown Has patient had a PCN reaction causing severe rash involving mucus membranes or skin necrosis: Unknown Has patient had a PCN reaction that required hospitalization: Unknown Has patient had a PCN reaction occurring within the last 10 years: No If all of the above answers are "NO", then may proceed with Cephalosporin use.  . Codeine Rash    Patient Measurements: Height: 4\' 11"  (149.9 cm) Weight: 169 lb (76.7 kg) IBW/kg (Calculated) : 43.2 Heparin Dosing Weight:   Vital Signs: Temp: 97.3 F (36.3 C) (01/17 0751) Temp Source: Oral (01/17 0751) BP: 124/91 (01/17 1532) Pulse Rate: 51 (01/17 1532)  Labs: Recent Labs    11/01/17 1216 11/01/17 2028 11/02/17 0331 11/02/17 0523 11/03/17 0621  HGB 13.5  --  13.2  --  13.1  HCT 41.2  --  39.5  --  40.1  PLT 210  --  211  --  186  CREATININE 0.75  --   --  1.06* 1.15*  TROPONINI <0.03 0.03*  --  <0.03  --     Estimated Creatinine Clearance: 34.3 mL/min (A) (by C-G formula based on SCr of 1.15 mg/dL (H)).   Medical History: Past Medical History:  Diagnosis Date  . Abdominal pain, right upper quadrant   . Abnormal stress test    w/fixed defect  . Benign essential tremor   . Carpal tunnel syndrome   . DJD (degenerative joint disease) of knee   . GERD (gastroesophageal reflux disease)   . HTN (hypertension)   . Hyperlipidemia   . Syncope and collapse   . Ulnar nerve lesion   . UTI (urinary tract infection)   . Vitamin D deficiency     Medications:  Medications Prior to Admission   Medication Sig Dispense Refill Last Dose  . aspirin EC 81 MG tablet Take 81 mg by mouth daily.   10/31/2017 at 1200  . Biotin 5000 MCG TABS Take 5,000 mcg by mouth daily.    10/31/2017 at pm  . cholecalciferol (VITAMIN D) 1000 units tablet Take 1,000 Units by mouth daily.   10/31/2017 at pm  . hydrochlorothiazide (HYDRODIURIL) 25 MG tablet Take 1 tablet (25 mg total) by mouth daily. 90 tablet 2 10/31/2017 at pm  . lisinopril (PRINIVIL,ZESTRIL) 40 MG tablet Take 1 tablet (40 mg total) by mouth daily. 90 tablet 2 10/31/2017 at pm  . Melatonin 5 MG TABS Take 5 mg by mouth at bedtime.    10/31/2017 at pm  . Multiple Vitamins-Minerals (MULTIVITAMIN WITH MINERALS) tablet Take 1 tablet by mouth daily.   10/31/2017 at pm  . omega-3 acid ethyl esters (LOVAZA) 1 g capsule Take 1 g by mouth daily.   10/31/2017 at pm  . Ubiquinol 200 MG CAPS Take 200 mg by mouth daily.    10/31/2017 at pm   Scheduled:  . apixaban  5 mg Oral BID  . aspirin EC  81 mg Oral Daily  . furosemide  40 mg Oral Daily  . metoprolol tartrate  50 mg Oral BID  . potassium chloride  20 mEq Oral Daily  . sodium chloride flush  3  mL Intravenous Q12H    Assessment: Pharmacy consulted to dose and monitor apixaban for atrial fibrillation. Patient previously on enoxaparin thearapeutic dose.  Goal of Therapy:   Monitor platelets by anticoagulation protocol: Yes   Plan:  Will start apixaban 5 mg Po BID   Jonna Dittrich D 11/03/2017,6:46 PM

## 2017-11-04 ENCOUNTER — Telehealth: Payer: Self-pay

## 2017-11-04 LAB — BASIC METABOLIC PANEL
Anion gap: 10 (ref 5–15)
BUN: 20 mg/dL (ref 6–20)
CALCIUM: 9 mg/dL (ref 8.9–10.3)
CO2: 30 mmol/L (ref 22–32)
CREATININE: 1.1 mg/dL — AB (ref 0.44–1.00)
Chloride: 98 mmol/L — ABNORMAL LOW (ref 101–111)
GFR calc Af Amer: 53 mL/min — ABNORMAL LOW (ref >60)
GFR, EST NON AFRICAN AMERICAN: 46 mL/min — AB (ref >60)
Glucose, Bld: 112 mg/dL — ABNORMAL HIGH (ref 65–99)
Potassium: 3.4 mmol/L — ABNORMAL LOW (ref 3.5–5.1)
SODIUM: 138 mmol/L (ref 135–145)

## 2017-11-04 MED ORDER — ALUM & MAG HYDROXIDE-SIMETH 200-200-20 MG/5ML PO SUSP: 30.0000 mL | Freq: Four times a day (QID) | ORAL | 0 refills | Status: AC | PRN

## 2017-11-04 MED ORDER — FUROSEMIDE 20 MG PO TABS: 20.0000 mg | ORAL_TABLET | Freq: Every day | ORAL | Status: DC

## 2017-11-04 MED ORDER — APIXABAN 5 MG PO TABS: 5.0000 mg | ORAL_TABLET | Freq: Two times a day (BID) | ORAL | 0 refills | Status: DC

## 2017-11-04 MED ORDER — FUROSEMIDE 20 MG PO TABS: 20.0000 mg | ORAL_TABLET | Freq: Every day | ORAL | 0 refills | Status: DC

## 2017-11-04 MED ORDER — POTASSIUM CHLORIDE CRYS ER 10 MEQ PO TBCR: 10.0000 meq | EXTENDED_RELEASE_TABLET | Freq: Every day | ORAL | 0 refills | Status: DC

## 2017-11-04 MED ORDER — METOPROLOL TARTRATE 75 MG PO TABS: 75.0000 mg | ORAL_TABLET | Freq: Two times a day (BID) | ORAL | 0 refills | Status: DC

## 2017-11-04 MED ORDER — LISINOPRIL 5 MG PO TABS: 5.0000 mg | ORAL_TABLET | Freq: Every day | ORAL | Status: DC

## 2017-11-04 MED ORDER — LISINOPRIL 5 MG PO TABS: 5.0000 mg | ORAL_TABLET | Freq: Every day | ORAL | 0 refills | Status: DC

## 2017-11-04 MED ORDER — METOPROLOL TARTRATE 50 MG PO TABS
75.0000 mg | ORAL_TABLET | Freq: Two times a day (BID) | ORAL | Status: DC
  Administered 2017-11-04: 75 mg via ORAL
  Filled 2017-11-04: qty 2

## 2017-11-04 MED ORDER — POTASSIUM CHLORIDE CRYS ER 10 MEQ PO TBCR: 10.0000 meq | EXTENDED_RELEASE_TABLET | Freq: Every day | ORAL | Status: DC

## 2017-11-04 NOTE — Discharge Instructions (Signed)
Heart healthy diet. Fall precaution Home health PT;Supervision/Assistance - 24 hour(HHOT, aide, RN)

## 2017-11-04 NOTE — Telephone Encounter (Signed)
Options would be PACE program, eldercare, etc.  Olegario MessierKathy would have more information.

## 2017-11-04 NOTE — Progress Notes (Signed)
Physical Therapy Treatment Patient Details Name: Cheryl LoganRita Steele Winters MRN: [id number] DOB: 08-15-1934 Today's Date: 11/04/2017    History of Present Illness presented to ER secondary to dizziness, elevated HR; admitted with afib with RVR, initially requiring cardizem drip (now weaned). Hospital course complicated by transfer to CCU due to hypotension; returned to telemetry.    PT Comments    Significant improvement in gait distance and overall activity tolerance, completing 2 full laps around nursing station (400') with RW, cga/close sup.  Slow, but fairly steady, cadence without buckling or LOB.  10' walk time, 12 seconds-below age-matched norms; indicative of increased fall risk.  Do continue to recommend use of RW at all times.  Patient/family aware and in agreement. Family working to establish 24 hour sup within the home; care management at bedside to assist.   Follow Up Recommendations  Home health PT;Supervision/Assistance - 24 hour(HHOT, aide, RN)     Equipment Recommendations  Rolling walker with 5" wheels(youth)    Recommendations for Other Services       Precautions / Restrictions Precautions Precautions: Fall Restrictions Weight Bearing Restrictions: No    Mobility  Bed Mobility               General bed mobility comments: seated in recliner beginning/end of treatment session  Transfers Overall transfer level: Needs assistance Equipment used: Rolling walker (2 wheeled) Transfers: Sit to/from Stand Sit to Stand: Supervision            Ambulation/Gait Ambulation/Gait assistance: Supervision Ambulation Distance (Feet): 400 Feet Assistive device: Rolling walker (2 wheeled)   Gait velocity: 10' walk time, 12 seconds Gait velocity interpretation: <1.8 ft/sec, indicative of risk for recurrent falls General Gait Details: reciprocal stepping pattern, mildly antalgic towards L; slow, but steady, cadence and overall gait performance   Stairs             Wheelchair Mobility    Modified Rankin (Stroke Patients Only)       Balance                                            Cognition Arousal/Alertness: Awake/alert Behavior During Therapy: WFL for tasks assessed/performed Overall Cognitive Status: Within Functional Limits for tasks assessed                                        Exercises      General Comments        Pertinent Vitals/Pain Pain Assessment: No/denies pain    Home Living                      Prior Function            PT Goals (current goals can now be found in the care plan section) Acute Rehab PT Goals Patient Stated Goal: per family, to get increased support in the home PT Goal Formulation: With patient/family Time For Goal Achievement: 11/17/17 Potential to Achieve Goals: Good Progress towards PT goals: Progressing toward goals    Frequency    Min 2X/week      PT Plan Current plan remains appropriate    Co-evaluation              AM-PAC PT "6 Clicks" Daily Activity  Outcome Measure  Difficulty turning  over in bed (including adjusting bedclothes, sheets and blankets)?: A Little Difficulty moving from lying on back to sitting on the side of the bed? : A Little Difficulty sitting down on and standing up from a chair with arms (e.g., wheelchair, bedside commode, etc,.)?: A Little Help needed moving to and from a bed to chair (including a wheelchair)?: A Little Help needed walking in hospital room?: A Little Help needed climbing 3-5 steps with a railing? : A Little 6 Click Score: 18    End of Session Equipment Utilized During Treatment: Gait belt Activity Tolerance: Patient tolerated treatment well Patient left: in chair;with call bell/phone within reach;with chair alarm set;with family/visitor present Nurse Communication: Mobility status PT Visit Diagnosis: Unsteadiness on feet (R26.81);History of falling (Z91.81);Difficulty in walking,  not elsewhere classified (R26.2)     Time: 1100-1115 PT Time Calculation (min) (ACUTE ONLY): 15 min  Charges:  $Gait Training: 8-22 mins                    G Codes:       Alicya Bena H. Manson Passey, PT, DPT, NCS 11/04/17, 11:26 AM [phone]

## 2017-11-04 NOTE — Telephone Encounter (Signed)
Copied from CRM [phone]. Topic: Inquiry >> Nov 04, 2017  9:05 AM Jolayne Hainesaylor, Brittany L wrote: Daughter is wanting to know about the program that the Dr mentioned for dementia patients when she was in the office on Tuesday. Call back is [phone] Asher Muir(Jamie)  She is currently in the hospital.  She went into rapid response. They stated she needs 24 hour care & they are discharging her today.

## 2017-11-04 NOTE — Telephone Encounter (Signed)
We can make a referral to the [facility] program and see if patient meets there criteria. The form can be completed by anyone.

## 2017-11-04 NOTE — Progress Notes (Signed)
ANTICOAGULATION CONSULT NOTE - Initial Consult  Pharmacy Consult for apixaban Indication: atrial fibrillation  Allergies  Allergen Reactions  . Amlodipine Other (See Comments)    malaise malaise  . Amoxicillin-Pot Clavulanate     Other reaction(s): Other (See Comments) Cannot remember.  . Penicillins     Has patient had a PCN reaction causing immediate rash, facial/tongue/throat swelling, SOB or lightheadedness with hypotension: Unknown Has patient had a PCN reaction causing severe rash involving mucus membranes or skin necrosis: Unknown Has patient had a PCN reaction that required hospitalization: Unknown Has patient had a PCN reaction occurring within the last 10 years: No If all of the above answers are "NO", then may proceed with Cephalosporin use.  . Codeine Rash    Patient Measurements: Height: 4\' 11"  (149.9 cm) Weight: 170 lb 1.6 oz (77.2 kg) IBW/kg (Calculated) : 43.2 Heparin Dosing Weight:   Vital Signs: Temp: 98.2 F (36.8 C) (01/18 0731) Temp Source: Oral (01/18 0731) BP: 129/85 (01/18 0731) Pulse Rate: 69 (01/18 0731)  Labs: Recent Labs    11/01/17 1216 11/01/17 2028 11/02/17 0331 11/02/17 0523 11/03/17 0621  HGB 13.5  --  13.2  --  13.1  HCT 41.2  --  39.5  --  40.1  PLT 210  --  211  --  186  CREATININE 0.75  --   --  1.06* 1.15*  TROPONINI <0.03 0.03*  --  <0.03  --     Estimated Creatinine Clearance: 34.4 mL/min (A) (by C-G formula based on SCr of 1.15 mg/dL (H)).   Medical History: Past Medical History:  Diagnosis Date  . Abdominal pain, right upper quadrant   . Abnormal stress test    w/fixed defect  . Benign essential tremor   . Carpal tunnel syndrome   . DJD (degenerative joint disease) of knee   . GERD (gastroesophageal reflux disease)   . HTN (hypertension)   . Hyperlipidemia   . Syncope and collapse   . Ulnar nerve lesion   . UTI (urinary tract infection)   . Vitamin D deficiency     Medications:  Medications Prior to  Admission  Medication Sig Dispense Refill Last Dose  . aspirin EC 81 MG tablet Take 81 mg by mouth daily.   10/31/2017 at 1200  . Biotin 5000 MCG TABS Take 5,000 mcg by mouth daily.    10/31/2017 at pm  . cholecalciferol (VITAMIN D) 1000 units tablet Take 1,000 Units by mouth daily.   10/31/2017 at pm  . hydrochlorothiazide (HYDRODIURIL) 25 MG tablet Take 1 tablet (25 mg total) by mouth daily. 90 tablet 2 10/31/2017 at pm  . lisinopril (PRINIVIL,ZESTRIL) 40 MG tablet Take 1 tablet (40 mg total) by mouth daily. 90 tablet 2 10/31/2017 at pm  . Melatonin 5 MG TABS Take 5 mg by mouth at bedtime.    10/31/2017 at pm  . Multiple Vitamins-Minerals (MULTIVITAMIN WITH MINERALS) tablet Take 1 tablet by mouth daily.   10/31/2017 at pm  . omega-3 acid ethyl esters (LOVAZA) 1 g capsule Take 1 g by mouth daily.   10/31/2017 at pm  . Ubiquinol 200 MG CAPS Take 200 mg by mouth daily.    10/31/2017 at pm   Scheduled:  . apixaban  5 mg Oral BID  . aspirin EC  81 mg Oral Daily  . furosemide  40 mg Oral Daily  . Melatonin  1 tablet Oral Daily  . metoprolol tartrate  75 mg Oral BID  . potassium chloride  20  mEq Oral Daily  . sodium chloride flush  3 mL Intravenous Q12H    Assessment: Pharmacy consulted to dose and monitor apixaban for atrial fibrillation. Patient previously on enoxaparin thearapeutic dose.  Goal of Therapy:   Monitor platelets by anticoagulation protocol: Yes   Plan:  Will start apixaban 5 mg Po BID   Nalani Andreen 11/04/2017,8:56 AM

## 2017-11-04 NOTE — Progress Notes (Signed)
NSR. Room air. Pt up to BR and tolerated it well. Pt reports no pain. Pt up to chair for meals. IV and tele removed. Discharge instructions given to pt. Walker was delivered. Prescriptions given to daughter as well as a list of private duty companies. No further concerns at this time.

## 2017-11-04 NOTE — Discharge Summary (Signed)
[facility] [place] [facility]   PATIENT NAME: Cheryl Winters    MR#:  [id number]  DATE OF BIRTH:  Aug 01, 1934  DATE OF ADMISSION:  11/01/2017   ADMITTING PHYSICIAN: Shaune Pollack, MD  DATE OF DISCHARGE: 11/04/2017  PRIMARY CARE PHYSICIAN: Dale Eddystone, MD   ADMISSION DIAGNOSIS:  New onset atrial fibrillation (HCC) [I48.91] Atrial fibrillation with rapid ventricular response (HCC) [I48.91] DISCHARGE DIAGNOSIS:  Principal Problem:   Atrial fibrillation with RVR (HCC) Active Problems:   Hypertension   Bradycardia   Pleural effusion   Dilated cardiomyopathy (HCC)   Dementia  SECONDARY DIAGNOSIS:   Past Medical History:  Diagnosis Date  . Abdominal pain, right upper quadrant   . Abnormal stress test    w/fixed defect  . Benign essential tremor   . Carpal tunnel syndrome   . DJD (degenerative joint disease) of knee   . GERD (gastroesophageal reflux disease)   . HTN (hypertension)   . Hyperlipidemia   . Syncope and collapse   . Ulnar nerve lesion   . UTI (urinary tract infection)   . Vitamin D deficiency    HOSPITAL COURSE:   Patient is a 84 year old admitted with A. fib with RVR  1. A. fib with RVR Due to low blood pressure and elevated heart rate she was started on amiodarone drip Now heart rate much improved. Echocardiogram showed ejection fraction 30% with systolic dysfunction. Per Dr. Mariah Milling, changed to Lopressor 75 mg twice daily, added digoxin low dose, stopped amiodarone drip.  Hold HCTZ and lisinopril until adequate heart rate control. She was on Full dose Lovenox, changed to Eliquis 5 mg twice daily.  2.  Hypotension related to A. fib with RVR, improved.  3.  Pleural effusion secondary to A. fib with RVR.  Started Lasix 20 mg daily with potassium per Dr. Mariah Milling.  4.GERD.   Continue Protonix  Dementia.  Aspiration fall precaution.  PT evaluation: HHPT.  Discussed with Dr. Mariah Milling. DISCHARGE CONDITIONS:  Stable, discharge  to home with home health and PT today. CONSULTS OBTAINED:  Treatment Team:  Marinus Maw, MD DRUG ALLERGIES:   Allergies  Allergen Reactions  . Amlodipine Other (See Comments)    malaise malaise  . Amoxicillin-Pot Clavulanate     Other reaction(s): Other (See Comments) Cannot remember.  . Penicillins     Has patient had a PCN reaction causing immediate rash, facial/tongue/throat swelling, SOB or lightheadedness with hypotension: Unknown Has patient had a PCN reaction causing severe rash involving mucus membranes or skin necrosis: Unknown Has patient had a PCN reaction that required hospitalization: Unknown Has patient had a PCN reaction occurring within the last 10 years: No If all of the above answers are "NO", then may proceed with Cephalosporin use.  . Codeine Rash   DISCHARGE MEDICATIONS:   Allergies as of 11/04/2017      Reactions   Amlodipine Other (See Comments)   malaise malaise   Amoxicillin-pot Clavulanate    Other reaction(s): Other (See Comments) Cannot remember.   Penicillins    Has patient had a PCN reaction causing immediate rash, facial/tongue/throat swelling, SOB or lightheadedness with hypotension: Unknown Has patient had a PCN reaction causing severe rash involving mucus membranes or skin necrosis: Unknown Has patient had a PCN reaction that required hospitalization: Unknown Has patient had a PCN reaction occurring within the last 10 years: No If all of the above answers are "NO", then may proceed with Cephalosporin use.   Codeine Rash  Medication List    STOP taking these medications   hydrochlorothiazide 25 MG tablet Commonly known as:  HYDRODIURIL     TAKE these medications   alum & mag hydroxide-simeth 200-200-20 MG/5ML suspension Commonly known as:  MAALOX/MYLANTA Take 30 mLs by mouth every 6 (six) hours as needed for indigestion or heartburn.   apixaban 5 MG Tabs tablet Commonly known as:  ELIQUIS Take 1 tablet (5 mg total) by  mouth 2 (two) times daily.   aspirin EC 81 MG tablet Take 81 mg by mouth daily.   Biotin 5000 MCG Tabs Take 5,000 mcg by mouth daily.   cholecalciferol 1000 units tablet Commonly known as:  VITAMIN D Take 1,000 Units by mouth daily.   furosemide 20 MG tablet Commonly known as:  LASIX Take 1 tablet (20 mg total) by mouth daily. Start taking on:  11/05/2017   lisinopril 5 MG tablet Commonly known as:  PRINIVIL,ZESTRIL Take 1 tablet (5 mg total) by mouth daily. What changed:    medication strength  how much to take   Melatonin 5 MG Tabs Take 5 mg by mouth at bedtime.   Metoprolol Tartrate 75 MG Tabs Take 75 mg by mouth 2 (two) times daily.   multivitamin with minerals tablet Take 1 tablet by mouth daily.   omega-3 acid ethyl esters 1 g capsule Commonly known as:  LOVAZA Take 1 g by mouth daily.   potassium chloride 10 MEQ tablet Commonly known as:  K-DUR,KLOR-CON Take 1 tablet (10 mEq total) by mouth daily. Start taking on:  11/05/2017   Ubiquinol 200 MG Caps Take 200 mg by mouth daily.            Durable Medical Equipment  (From admission, onward)        Start     Ordered   11/04/17 1058  For home use only DME Walker rolling  Once    Question:  Patient needs a walker to treat with the following condition  Answer:  Weakness   11/04/17 1057       DISCHARGE INSTRUCTIONS:  See AVS.  If you experience worsening of your admission symptoms, develop shortness of breath, life threatening emergency, suicidal or homicidal thoughts you must seek medical attention immediately by calling 911 or calling your MD immediately  if symptoms less severe.  You Must read complete instructions/literature along with all the possible adverse reactions/side effects for all the Medicines you take and that have been prescribed to you. Take any new Medicines after you have completely understood and accpet all the possible adverse reactions/side effects.   Please note  You were  cared for by a hospitalist during your hospital stay. If you have any questions about your discharge medications or the care you received while you were in the hospital after you are discharged, you can call the unit and asked to speak with the hospitalist on call if the hospitalist that took care of you is not available. Once you are discharged, your primary care physician will handle any further medical issues. Please note that NO REFILLS for any discharge medications will be authorized once you are discharged, as it is imperative that you return to your primary care physician (or establish a relationship with a primary care physician if you do not have one) for your aftercare needs so that they can reassess your need for medications and monitor your lab values.    On the day of Discharge:  VITAL SIGNS:  Blood pressure 132/80, pulse 74,  temperature 98.2 F (36.8 C), temperature source Oral, resp. rate 18, height 4\' 11"  (1.499 m), weight 170 lb 1.6 oz (77.2 kg), SpO2 97 %. PHYSICAL EXAMINATION:  GENERAL:  84 y.o.-year-old patient lying in the bed with no acute distress.  EYES: Pupils equal, round, reactive to light and accommodation. No scleral icterus. Extraocular muscles intact.  HEENT: Head atraumatic, normocephalic. Oropharynx and nasopharynx clear.  NECK:  Supple, no jugular venous distention. No thyroid enlargement, no tenderness.  LUNGS: Normal breath sounds bilaterally, no wheezing, rales,rhonchi or crepitation. No use of accessory muscles of respiration.  CARDIOVASCULAR: S1, S2 normal. No murmurs, rubs, or gallops.  ABDOMEN: Soft, non-tender, non-distended. Bowel sounds present. No organomegaly or mass.  EXTREMITIES: No pedal edema, cyanosis, or clubbing.  NEUROLOGIC: Cranial nerves II through XII are intact. Muscle strength 5/5 in all extremities. Sensation intact. Gait not checked.  PSYCHIATRIC: The patient is alert and oriented x 3.  SKIN: No obvious rash, lesion, or ulcer.  DATA  REVIEW:   CBC Recent Labs  Lab 11/03/17 0621  WBC 5.6  HGB 13.1  HCT 40.1  PLT 186    Chemistries  Recent Labs  Lab 11/02/17 0523  11/04/17 1053  NA 139   < > 138  K 4.0   < > 3.4*  CL 105   < > 98*  CO2 25   < > 30  GLUCOSE 117*   < > 112*  BUN 22*   < > 20  CREATININE 1.06*   < > 1.10*  CALCIUM 9.1   < > 9.0  MG 1.9  --   --    < > = values in this interval not displayed.     Microbiology Results  Results for orders placed or performed during the hospital encounter of 11/01/17  MRSA PCR Screening     Status: None   Collection Time: 11/01/17  8:13 PM  Result Value Ref Range Status   MRSA by PCR NEGATIVE NEGATIVE Final    Comment:        The GeneXpert MRSA Assay (FDA approved for NASAL specimens only), is one component of a comprehensive MRSA colonization surveillance program. It is not intended to diagnose MRSA infection nor to guide or monitor treatment for MRSA infections. Performed at [facility], [address] [place], [place] [address]     RADIOLOGY:  No results found.   Management plans discussed with the patient, daughter and they are in agreement.  CODE STATUS: Full Code   TOTAL TIME TAKING CARE OF THIS PATIENT: 35 minutes.    Shaune PollackQing Kentavious Michele M.D on 11/04/2017 at 1:56 PM  Between 7am to 6pm - Pager - [phone]  After 6pm go to [email or web address] - Social research officer, governmentpassword [email or web address]  [facility] [place] [facility]  Office  [phone]  CC: Primary care physician; Dale DurhamScott, Charlene, MD   Note: This dictation was prepared with Dragon dictation along with smaller phrase technology. Any transcriptional errors that result from this process are unintentional.

## 2017-11-04 NOTE — Progress Notes (Addendum)
Progress Note  Patient Name: Cheryl CooksRita Steele Winters Date of Encounter: 11/04/2017  Primary Cardiologist[facility]  Subjective    Telemetry reviewed atrial fibrillation rate 90-100 Blood pressure stable, tolerating metoprolol  And amiodarone infusion held yesterday Denies any complaints  Inpatient Medications    Scheduled Meds: . apixaban  5 mg Oral BID  . aspirin EC  81 mg Oral Daily  . furosemide  40 mg Oral Daily  . Melatonin  1 tablet Oral Daily  . metoprolol tartrate  75 mg Oral BID  . potassium chloride  20 mEq Oral Daily  . sodium chloride flush  3 mL Intravenous Q12H   Continuous Infusions: . sodium chloride     PRN Meds: sodium chloride, acetaminophen **OR** acetaminophen, alum & mag hydroxide-simeth, bisacodyl, ondansetron **OR** ondansetron (ZOFRAN) IV, senna-docusate, sodium chloride flush   Vital Signs    Vitals:   11/03/17 1532 11/03/17 2002 11/04/17 0355 11/04/17 0731  BP: (!) 124/91 (!) 150/84 (!) 125/96 129/85  Pulse: (!) 51 (!) 37 72 69  Resp: 18 20 18    Temp:  98.7 F (37.1 C) 98.2 F (36.8 C) 98.2 F (36.8 C)  TempSrc:  Oral Oral Oral  SpO2: 91% 92% 98% 97%  Weight:   170 lb 1.6 oz (77.2 kg)   Height:        Intake/Output Summary (Last 24 hours) at 11/04/2017 1045 Last data filed at 11/04/2017 0900 Gross per 24 hour  Intake 356.9 ml  Output 1450 ml  Net -1093.1 ml   Filed Weights   11/01/17 1213 11/04/17 0355  Weight: 169 lb (76.7 kg) 170 lb 1.6 oz (77.2 kg)    Telemetry    Atrial fibrillation ventricular rate 80 - 90 bpm personally reviewed by myself  ECG     Physical Exam  No significant change in exam GEN: No acute distress.   Neck: No JVD Cardiac:   irregularly irregular  no murmurs, rubs, or gallops.  Respiratory: dullness at the bases , scattered rails  GI: Soft, nontender, non-distended  MS: No edema; No deformity. Neuro:  Nonfocal  Psych: Normal affect   Labs    Chemistry Recent Labs  Lab 11/01/17 1216  11/02/17 0523 11/03/17 0621  NA 141 139 134*  K 3.6 4.0 3.7  CL 102 105 101  CO2 27 25 24   GLUCOSE 107* 117* 102*  BUN 19 22* 22*  CREATININE 0.75 1.06* 1.15*  CALCIUM 10.0 9.1 9.0  GFRNONAA >60 48* 43*  GFRAA >60 56* 50*  ANIONGAP 12 9 9      Hematology Recent Labs  Lab 11/01/17 1216 11/02/17 0331 11/03/17 0621  WBC 5.4 6.8 5.6  RBC 4.67 4.48 4.54  HGB 13.5 13.2 13.1  HCT 41.2 39.5 40.1  MCV 88.3 88.2 88.4  MCH 29.0 29.4 28.9  MCHC 32.8 33.4 32.7  RDW 14.1 14.1 14.0  PLT 210 211 186    Cardiac Enzymes Recent Labs  Lab 11/01/17 1216 11/01/17 2028 11/02/17 0523  TROPONINI <0.03 0.03* <0.03   No results for input(s): TROPIPOC in the last 168 hours.   BNP Recent Labs  Lab 11/01/17 1216  BNP 366.0*     DDimer No results for input(s): DDIMER in the last 168 hours.   Radiology    No results found.  Cardiac Studies   Echocardiogram performed today Left ventricle: The cavity size was normal. Systolic function was moderately to severely reduced. The estimated ejection fraction was in the range of 30% to 35%. Diffuse  hypokinesis. Regional wall motion abnormalities cannot be excluded. The study is not   technically sufficient to allow evaluation of LV diastolic   function. - Mitral valve: There was moderate to severe regurgitation. - Left atrium: The atrium was severely dilated. - Right ventricle: The cavity size was moderately dilated. Wall thickness was normal. Systolic function was mildly reduced. - Right atrium: The atrium was mildly dilated. - Tricuspid valve: There was moderate regurgitation. - Pulmonary arteries: Systolic pressure was moderately elevated. PA peak pressure: 55 mm Hg (S). - Moderate sized left pleural effusion noted.  Patient Profile    84 y.o. female  with history of dementia, hypertension, hyperlipidemia, tremor noted to have dizziness, palpitations, fatigue dating back to November 2018 presented to primary care, EKG documenting  atrial fibrillation with RVR , shortness breath, malaise, weakness     Assessment & Plan    Atrial fibrillation with RVR Amiodarone infusion held On eliquis 5 twice a day,  Continue digoxin 0.125 mg daily -Metoprolol 75 twice a day hold HCTZ and lisinopril until adequate heart rate Outpatient follow-up in clinic  Cardiomyopathy On metoprolol BID Will add low dose lisinopril As outpt was can add spironolactone and titrate ACE inh as BP permits  Pleural effusion Secondary to atrial fibrillation with RVR Heart rate improved Recommend we add Lasix 20 mg daily with potassium 10 at discharge  Dementia Several discussions with family during this hospital admission Discussed anticoagulation High risk of stroke Noncompliant with medication secondary to dementia She will need caretaker or family to help with medications morning and evening Would recommend patient not drive until she is cleared by Dr. Malvin Johns or Dr. Lorin Picket   Total encounter time more than 25 minutes  Greater than 50% was spent in counseling and coordination of care with the patient  For questions or updates, please contact [facility] Please consult [email or web address] for contact info under Cardiology/STEMI.      Signed, Julien Nordmann, MD  11/04/2017, 10:45 AM

## 2017-11-04 NOTE — Telephone Encounter (Signed)
Not sure what program you discussed with her, let me know and I will call Asher MuirJamie and let her know.

## 2017-11-04 NOTE — Telephone Encounter (Signed)
Olegario MessierKathy, is this something you can take care of or give me the info to let them know?

## 2017-11-04 NOTE — Care Management Note (Addendum)
Case Management Note  Patient Details  Name: Cheryl LoganRita Steele Engelstad MRN: [id number] Date of Birth: 1934-07-30   Patient discharged home today.  Barbara CowerJason with [facility] notified of discharge.   [employer] walker delivered by [employer] prior to discharge.  Daughter transported at discharge. Daughter expressed concerns about patient being at home alone.  She states the "sitter agency we wont doesn't have any availability till next Friday".  RNCM informed daughter that they would have to arrange for her children to stay with her in the interim, or select a different agency that could meet their needs.  Daughter in agreement.  Additional PCS list provided at request. Eliquis coupon provided. RNCM signing off  Subjective/Objective:                    Action/Plan:   Expected Discharge Date:  11/04/17               Expected Discharge Plan:  Home w Home Health Services  In-House Referral:     Discharge planning Services  CM Consult  Post Acute Care Choice:  Home Health Choice offered to:  Adult Children  DME Arranged:    DME Agency:     HH Arranged:  RN, PT, Social Work [facility] [employer]  [facility]  Status of Service:  Completed, signed off  If discussed at [employer] Meetings, dates discussed:    Additional Comments:  Chapman FitchBOWEN, Jovonni Borquez T, RN 11/04/2017, 5:58 PM

## 2017-11-07 ENCOUNTER — Telehealth: Payer: Self-pay | Admitting: Internal Medicine

## 2017-11-07 NOTE — Telephone Encounter (Signed)
Please advise 

## 2017-11-07 NOTE — Telephone Encounter (Signed)
Copied from CRM [phone]. Topic: Quick Communication - See Telephone Encounter >> Nov 07, 2017  2:54 PM Oneal GroutSebastian, Jennifer S wrote: CRM for notification. See Telephone encounter for:  Patient daughter calling requesting a handicap placard due to walker. Please advise 11/07/17.

## 2017-11-08 ENCOUNTER — Telehealth: Payer: Self-pay | Admitting: Internal Medicine

## 2017-11-08 ENCOUNTER — Emergency Department
Admission: EM | Admit: 2017-11-08 | Discharge: 2017-11-08 | Disposition: A | Discharge status: Home / Self Care | Payer: Medicare Other | Attending: Emergency Medicine | Admitting: Emergency Medicine

## 2017-11-08 ENCOUNTER — Emergency Department: Payer: Medicare Other

## 2017-11-08 ENCOUNTER — Other Ambulatory Visit: Payer: Self-pay

## 2017-11-08 ENCOUNTER — Telehealth: Payer: Self-pay | Admitting: Cardiovascular Disease

## 2017-11-08 DIAGNOSIS — F0390 Unspecified dementia without behavioral disturbance: Secondary | ICD-10-CM | POA: Insufficient documentation

## 2017-11-08 DIAGNOSIS — Z96653 Presence of artificial knee joint, bilateral: Secondary | ICD-10-CM | POA: Insufficient documentation

## 2017-11-08 DIAGNOSIS — I4891 Unspecified atrial fibrillation: Secondary | ICD-10-CM

## 2017-11-08 DIAGNOSIS — I420 Dilated cardiomyopathy: Secondary | ICD-10-CM | POA: Diagnosis not present

## 2017-11-08 DIAGNOSIS — Z7901 Long term (current) use of anticoagulants: Secondary | ICD-10-CM | POA: Insufficient documentation

## 2017-11-08 DIAGNOSIS — Z7982 Long term (current) use of aspirin: Secondary | ICD-10-CM | POA: Diagnosis not present

## 2017-11-08 DIAGNOSIS — I10 Essential (primary) hypertension: Secondary | ICD-10-CM | POA: Insufficient documentation

## 2017-11-08 DIAGNOSIS — Z79899 Other long term (current) drug therapy: Secondary | ICD-10-CM | POA: Diagnosis not present

## 2017-11-08 DIAGNOSIS — R42 Dizziness and giddiness: Secondary | ICD-10-CM | POA: Diagnosis present

## 2017-11-08 LAB — URINALYSIS, COMPLETE (UACMP) WITH MICROSCOPIC
Bilirubin Urine: NEGATIVE
Glucose, UA: NEGATIVE mg/dL
Ketones, ur: NEGATIVE mg/dL
NITRITE: NEGATIVE
PROTEIN: NEGATIVE mg/dL
Specific Gravity, Urine: 1.009 (ref 1.005–1.030)
pH: 7 (ref 5.0–8.0)

## 2017-11-08 LAB — COMPREHENSIVE METABOLIC PANEL
ALBUMIN: 3.5 g/dL (ref 3.5–5.0)
ALT: 25 U/L (ref 14–54)
AST: 29 U/L (ref 15–41)
Alkaline Phosphatase: 45 U/L (ref 38–126)
Anion gap: 10 (ref 5–15)
BUN: 23 mg/dL — AB (ref 6–20)
CHLORIDE: 101 mmol/L (ref 101–111)
CO2: 29 mmol/L (ref 22–32)
Calcium: 8.8 mg/dL — ABNORMAL LOW (ref 8.9–10.3)
Creatinine, Ser: 1.02 mg/dL — ABNORMAL HIGH (ref 0.44–1.00)
GFR calc Af Amer: 58 mL/min — ABNORMAL LOW (ref >60)
GFR calc non Af Amer: 50 mL/min — ABNORMAL LOW (ref >60)
GLUCOSE: 106 mg/dL — AB (ref 65–99)
POTASSIUM: 3.4 mmol/L — AB (ref 3.5–5.1)
Sodium: 140 mmol/L (ref 135–145)
Total Bilirubin: 1.2 mg/dL (ref 0.3–1.2)
Total Protein: 6.2 g/dL — ABNORMAL LOW (ref 6.5–8.1)

## 2017-11-08 LAB — CBC WITH DIFFERENTIAL/PLATELET
BASOS PCT: 1 %
Basophils Absolute: 0.1 10*3/uL (ref 0–0.1)
Eosinophils Absolute: 0.2 10*3/uL (ref 0–0.7)
Eosinophils Relative: 3 %
HCT: 39.1 % (ref 35.0–47.0)
HEMOGLOBIN: 13.2 g/dL (ref 12.0–16.0)
LYMPHS ABS: 1.8 10*3/uL (ref 1.0–3.6)
Lymphocytes Relative: 33 %
MCH: 29.5 pg (ref 26.0–34.0)
MCHC: 33.7 g/dL (ref 32.0–36.0)
MCV: 87.4 fL (ref 80.0–100.0)
MONOS PCT: 8 %
Monocytes Absolute: 0.4 10*3/uL (ref 0.2–0.9)
NEUTROS ABS: 3 10*3/uL (ref 1.4–6.5)
NEUTROS PCT: 55 %
Platelets: 218 10*3/uL (ref 150–440)
RBC: 4.47 MIL/uL (ref 3.80–5.20)
RDW: 14.4 % (ref 11.5–14.5)
WBC: 5.4 10*3/uL (ref 3.6–11.0)

## 2017-11-08 LAB — BRAIN NATRIURETIC PEPTIDE: B Natriuretic Peptide: 680 pg/mL — ABNORMAL HIGH (ref 0.0–100.0)

## 2017-11-08 LAB — LACTIC ACID, PLASMA: LACTIC ACID, VENOUS: 1.3 mmol/L (ref 0.5–1.9)

## 2017-11-08 LAB — TROPONIN I: Troponin I: <0.03 ng/mL (ref <0.03)

## 2017-11-08 NOTE — Telephone Encounter (Signed)
I received a call from Dr. Darnelle CatalanMalinda in the ED regarding Cheryl Winters, who presented with lightheadedness and reports of bradycardia at home. In the ED she has been in atrial fibrillation with documented heart rates of 46-143. Currently, Dr. Darnelle CatalanMalinda reports HRs around 90's to low hundreds. Dizziness has resolved. I think it is reasonable to decrease metoprolol to 25 mg BID, though it is possible that pulse was not accurately assessed at home, given a-fib. We will contact the patient tomorrow to arrange for outpatient f/u in 1-2 days. She should return to the ED if she has recurrent symptoms.  Cheryl Kendallhristopher Karenann Mcgrory, MD [facility] Pager: [phone]

## 2017-11-08 NOTE — ED Provider Notes (Addendum)
[facility] Emergency Department Provider Note   ____________________________________________   First MD Initiated Contact with Patient 11/08/17 1735     (approximate)  I have reviewed the triage vital signs and the nursing notes.   HISTORY  Chief Complaint Dizziness    HPI Cheryl Winters is a 84 y.o. female who has a history of mild dementia was recently in the hospital for new onset A. fib or atrial flutter today was noted to be dizzy and complained of dizziness. Seemed to be lightheadedness more than vertigo. She had one episode where she stared off into space for a few seconds and the other one other episode where she walked to the side for a few seconds but both of these resolved she did not have any noticeable weakness she was not leaning to one side she did not have any slurred speech or facial droop. She had one episode of a pulse in the 40s. Her family to call Dr.Arida the cardiologist and he told them to hold her metoprolol. Here in the emergency room she is running A. fib at a rate of 95 normal axis   Past Medical History:  Diagnosis Date  . Abdominal pain, right upper quadrant   . Abnormal stress test    w/fixed defect  . Benign essential tremor   . Carpal tunnel syndrome   . DJD (degenerative joint disease) of knee   . GERD (gastroesophageal reflux disease)   . HTN (hypertension)   . Hyperlipidemia   . Syncope and collapse   . Ulnar nerve lesion   . UTI (urinary tract infection)   . Vitamin D deficiency     Patient Active Problem List   Diagnosis Date Noted  . Dilated cardiomyopathy (HCC) 11/03/2017  . Dementia 11/03/2017  . Pleural effusion 11/02/2017  . A-fib (HCC) 11/01/2017  . Atrial fibrillation with RVR (HCC) 11/01/2017  . Memory change 06/27/2016  . Acute cystitis without hematuria 11/03/2015  . Lightheadedness 10/31/2015  . Bradycardia 09/19/2014  . Health care maintenance 09/02/2014  . Medicare annual wellness visit,  subsequent 08/21/2012  . Tremor, essential 08/21/2012  . Hypertension 11/22/2011  . Hyperlipidemia 11/22/2011    Past Surgical History:  Procedure Laterality Date  . Bilateral Bletharoplastices    . BREAST BIOPSY  1957 and 1962   x 2, Nml per pt  . BREAST CYST EXCISION     x 2 - nml path per pt  . CARPAL TUNNEL RELEASE  2004  . CATARACT EXTRACTION    . TOTAL KNEE ARTHROPLASTY     bilateral, total, Marcy Panning  . TOTAL SHOULDER REPLACEMENT  08/2010   left  . TUBAL LIGATION    . UMBILICAL HERNIA REPAIR    . VAGINAL DELIVERY     1 set Twins/2 single births    Prior to Admission medications   Medication Sig Start Date End Date Taking? Authorizing Provider  alum & mag hydroxide-simeth (MAALOX/MYLANTA) 200-200-20 MG/5ML suspension Take 30 mLs by mouth every 6 (six) hours as needed for indigestion or heartburn. 11/04/17   Shaune Pollack, MD  apixaban (ELIQUIS) 5 MG TABS tablet Take 1 tablet (5 mg total) by mouth 2 (two) times daily. 11/04/17   Shaune Pollack, MD  aspirin EC 81 MG tablet Take 81 mg by mouth daily.    [provider]  Biotin 5000 MCG TABS Take 5,000 mcg by mouth daily.     [provider]  cholecalciferol (VITAMIN D) 1000 units tablet Take 1,000 Units  by mouth daily.    [provider]  furosemide (LASIX) 20 MG tablet Take 1 tablet (20 mg total) by mouth daily. 11/05/17   Shaune Pollack, MD  lisinopril (PRINIVIL,ZESTRIL) 5 MG tablet Take 1 tablet (5 mg total) by mouth daily. 11/04/17   Shaune Pollack, MD  Melatonin 5 MG TABS Take 5 mg by mouth at bedtime.     [provider]  metoprolol tartrate 75 MG TABS Take 75 mg by mouth 2 (two) times daily. 11/04/17   Shaune Pollack, MD  Multiple Vitamins-Minerals (MULTIVITAMIN WITH MINERALS) tablet Take 1 tablet by mouth daily.    [provider]  omega-3 acid ethyl esters (LOVAZA) 1 g capsule Take 1 g by mouth daily.    [provider]  potassium chloride (K-DUR,KLOR-CON) 10 MEQ tablet Take 1 tablet  (10 mEq total) by mouth daily. 11/05/17   Shaune Pollack, MD  Ubiquinol 200 MG CAPS Take 200 mg by mouth daily.     [provider]    Allergies Amlodipine; Amoxicillin-pot clavulanate; Penicillins; and Codeine  Family History  Problem Relation Age of Onset  . Hypertension Mother   . Arthritis Mother   . Stroke Mother   . Dementia Mother   . Early death Father 40       from brain cancer  . Brain cancer Father 50  . Breast cancer Maternal Aunt   . Alcohol abuse Other     Social History Social History   Tobacco Use  . Smoking status: Never Smoker  . Smokeless tobacco: Never Used  Substance Use Topics  . Alcohol use: No  . Drug use: No    Review of Systems  Constitutional: No fever/chills Eyes: No visual changes. ENT: No sore throat. Cardiovascular: Denies chest pain. Respiratory: Denies shortness of breath. Gastrointestinal: No abdominal pain.  No nausea, no vomiting.  No diarrhea.  No constipation. Genitourinary: Negative for dysuria. Musculoskeletal: Negative for back pain. Skin: Negative for rash. Neurological: Negative for headaches, focal weakness   ____________________________________________   PHYSICAL EXAM:  VITAL SIGNS: ED Triage Vitals  Enc Vitals Group     BP 11/08/17 1732 (!) 136/98     Pulse Rate 11/08/17 1733 (!) 143     Resp 11/08/17 1732 18     Temp 11/08/17 1732 97.7 F (36.5 C)     Temp Source 11/08/17 1732 Oral     SpO2 11/08/17 1732 98 %     Weight 11/08/17 1733 170 lb (77.1 kg)     Height 11/08/17 1733 4\' 11"  (1.499 m)     Head Circumference --      Peak Flow --      Pain Score 11/08/17 1733 0     Pain Loc --      Pain Edu? --      Excl. in GC? --    Constitutional: Alert and oriented. Well appearing and in no acute distress. Eyes: Conjunctivae are normal. PER. EOMI. Head: Atraumatic. Nose: No congestion/rhinnorhea. Mouth/Throat: Mucous membranes are moist.  Oropharynx non-erythematous. Neck: No stridor.  Cardiovascular:  Normal rate, regular rhythm. Grossly normal heart sounds.  Good peripheral circulation. Respiratory: Normal respiratory effort.  No retractions. Lungs CTAB. Gastrointestinal: Soft and nontender. No distention. No abdominal bruits. No CVA tenderness. Musculoskeletal: No lower extremity tenderness nor edema.  No joint effusions. Neurologic:  Normal speech and language. No gross focal neurologic deficits are appreciated. cranial nerves II through XII are intact all the visual fields were not checked cerebellar finger-nose rapid  alternating movements and hands are normal motor strength is 5 over 5 throughout and sensation is intact throughout Skin:  Skin is warm, dry and intact. No rash noted. Psychiatric: Mood and affect are normal. Speech and behavior are normal.  ____________________________________________   LABS (all labs ordered are listed, but only abnormal results are displayed)  Labs Reviewed  COMPREHENSIVE METABOLIC PANEL - Abnormal; Notable for the following components:      Result Value   Potassium 3.4 (*)    Glucose, Bld 106 (*)    BUN 23 (*)    Creatinine, Ser 1.02 (*)    Calcium 8.8 (*)    Total Protein 6.2 (*)    GFR calc non Af Amer 50 (*)    GFR calc Af Amer 58 (*)    All other components within normal limits  BRAIN NATRIURETIC PEPTIDE - Abnormal; Notable for the following components:   B Natriuretic Peptide 680.0 (*)    All other components within normal limits  URINALYSIS, COMPLETE (UACMP) WITH MICROSCOPIC - Abnormal; Notable for the following components:   Color, Urine YELLOW (*)    APPearance HAZY (*)    Hgb urine dipstick SMALL (*)    Leukocytes, UA MODERATE (*)    Bacteria, UA RARE (*)    Squamous Epithelial / LPF 0-5 (*)    Non Squamous Epithelial 0-5 (*)    All other components within normal limits  TROPONIN I  CBC WITH DIFFERENTIAL/PLATELET  LACTIC ACID, PLASMA  LACTIC ACID, PLASMA   ____________________________________________  EKG  EKG read and  interpreted by me shows A. fib with 1 PVC rate is 95 normal axis no acute ST-T wave changes very low voltage. ____________________________________________  RADIOLOGY  Dg Chest 2 View  Result Date: 11/08/2017 CLINICAL DATA:  Weakness and dizziness EXAM: CHEST  2 VIEW COMPARISON:  11/02/2017 FINDINGS: Aortic atherosclerosis. Cardiac enlargement is again noted. There is a large left pleural effusion which appears slightly increased in volume from previous exam. Small left effusion is suspected. IMPRESSION: 1. Large left pleural effusion is slightly increased in volume from previous exam. 2. Cardiac enlargement and Aortic Atherosclerosis (ICD10-I70.0). Electronically Signed   By: Signa Kell M.D.   On: 11/08/2017 18:15   Ct Head Wo Contrast  Result Date: 11/08/2017 CLINICAL DATA:  Dizziness and confusion EXAM: CT HEAD WITHOUT CONTRAST TECHNIQUE: Contiguous axial images were obtained from the base of the skull through the vertex without intravenous contrast. COMPARISON:  Head CT September 11, 2014 and brain MRI October 15, 2016 FINDINGS: Brain: There is moderate diffuse atrophy. There is no intracranial mass, hemorrhage, extra-axial fluid collection, or midline shift. There is patchy small vessel disease throughout the centra semiovale bilaterally. Small vessel disease is noted in each thalamus. No acute infarct is demonstrable. Foci basal ganglia calcification is felt to be physiologic in this age group. Vascular: There is no appreciable hyperdense vessel. There is calcification in each carotid siphon as well as in the distal right vertebral artery. Skull: The bony calvarium appears intact. Sinuses/Orbits: There is mucosal thickening in several ethmoid air cells bilaterally. Visualized paranasal sinuses elsewhere are clear. There is rightward deviation of the nasal septum. Orbits appear symmetric bilaterally. Other: Mastoid air cells are clear. IMPRESSION: Atrophy with supratentorial small vessel disease,  stable. No acute infarct evident. No mass or hemorrhage. There are foci of arterial vascular calcification. There is ethmoid sinus disease bilaterally. Electronically Signed   By: Bretta Bang III M.D.   On: 11/08/2017 19:42   large  left pleural effusion possibly somewhat larger area enlargement is all that is seen on the chest x-ray ____________________________________________   PROCEDURES  Procedure(s) performed:   Procedures  Critical Care performed:   ____________________________________________   INITIAL IMPRESSION / ASSESSMENT AND PLAN / ED COURSE           ____________________________________________   FINAL CLINICAL IMPRESSION(S) / ED DIAGNOSES   on discharge the patient is doing much better her heart rate is gone up to 97 220. She has not had her metoprolol. Dr. Hidalgo Sinkita had told her not to take any metoprolol for the next couple days been afraid if we wait that long her heart rate will be up into the 180 range again. I will have her take metoprolol 25 twice a day instead of 75 twice a day hopefully this will keep her heart rate about where it supposed to be. I believe that the reason she was a little off before was that the metoprolol dose may been too high especially with a heart rate of 40 she probably was not perfusing her brain adequately. She is again back to normal at present and her heart rate is running between 9730.  Final diagnoses:  Dizziness     ED Discharge Orders    None       Note:  This document was prepared using Dragon voice recognition software and may include unintentional dictation errors.    Arnaldo NatalMalinda, Miracle Mongillo F, MD 11/08/17 2123    Arnaldo NatalMalinda, Jemel Ono F, MD 11/08/17 [phone]

## 2017-11-08 NOTE — ED Notes (Signed)
Family and pt notified that MD Darnelle CatalanMalinda was still in another pt room and once he came out I would ask about something to drink for pt and BP medications since pt missed dose.

## 2017-11-08 NOTE — ED Notes (Signed)
Pt assisted to restroom by this tech, pt was at that time in 18H, pt stating that "noone has seen me and I've been here all afternoon" I advised pt that I would check on the status of her care as soon as MD was out of another pts room. Pt moved into ED room 19 Per request by RN Lelon MastSamantha, pt family at bedside stating "I want something done for her, we have been here all afternoon and I feel like nothing has been done." pt family as well as pt advised that I would address their concerns with MD Darnelle Catalan(Malinda) RN Sam notified at this time

## 2017-11-08 NOTE — ED Notes (Signed)
Pt given graham crackers and water and advised she can take her home medications per MD

## 2017-11-08 NOTE — ED Notes (Addendum)
Family at bedside. 

## 2017-11-08 NOTE — Telephone Encounter (Signed)
Amy Pope from [facility] called to report update When she left the patients home the daughter contacted her to say she was complaining of shortness of breath and nausea  Amy advised patient to go to ER

## 2017-11-08 NOTE — Telephone Encounter (Signed)
Amy Pope calling from [facility] HR 43-47 and is experiencing dizziness [facility]

## 2017-11-08 NOTE — Telephone Encounter (Signed)
Received call from Solon PalmAmy Pope, RN, with [facility]. [phone]) Pt discharged from hospital Friday where she was admitted for new onset afib. New medications include eliquis and metoprolol Amy arrived at pt's home today for new patient assessment. BP 106/80, HR 43-45 Daughters who stay with patient started documenting VS today. Unsure of prior days VS. They administer medication as well. Pt reports feeling dizzy causing difficulty with walking.  Feels like bra is really tight. Cognitive impairment so difficulty getting further explanation. I advised to hold metoprolol and will review further with MD/APP.  Reviewed with Eula Listenyan Dunn, PA-C who advises: -hold metoprolol tonight and tomorrow morning -BMET, dig level and EKG tomorrow in office  Will also check BP and HR during nurse visit.  Reviewed with Amy who will notify daughters to have pt in office tomorrow at 10am. She verbalized understanding of all instructions and is appreciative of the return call.  Added to schedule.

## 2017-11-08 NOTE — ED Notes (Signed)
Pt back from x-ray.

## 2017-11-08 NOTE — ED Notes (Signed)
Pt gone to xray

## 2017-11-08 NOTE — Discharge Instructions (Signed)
please make sure you eating and drinking well. Take your medicines as directed except for the metoprolol. I want you to take one half of the metoprolol pill or 25 mg twice a day.Follow-up with your cardiologist or your primary care doctor tomorrow. Please return if you have further symptoms.

## 2017-11-08 NOTE — ED Notes (Signed)
Patient transported to CT 

## 2017-11-08 NOTE — ED Triage Notes (Signed)
Pt comes from home via [facility] with c/o of dizziness upon sitting. EMS states pt was recently dx with AFib and is currently running in 90-110s. Pt is Alert and oriented to self and place. EMS states BP 150/100. Pt appears in NAD at this time, respirations even and unlabored.

## 2017-11-08 NOTE — Telephone Encounter (Signed)
S/w pt's daughter, Rory PercyJayme who reports she called EMS as advised and pt is being transported to the hospital at this time.

## 2017-11-09 ENCOUNTER — Ambulatory Visit: Payer: Medicare Other

## 2017-11-09 ENCOUNTER — Other Ambulatory Visit: Payer: Medicare Other

## 2017-11-09 ENCOUNTER — Other Ambulatory Visit: Payer: Self-pay

## 2017-11-09 ENCOUNTER — Encounter: Payer: Self-pay | Admitting: Physician Assistant

## 2017-11-09 ENCOUNTER — Ambulatory Visit (INDEPENDENT_AMBULATORY_CARE_PROVIDER_SITE_OTHER): Payer: Medicare Other | Admitting: Physician Assistant

## 2017-11-09 VITALS — BP 132/80 | HR 103 | Ht 59.0 in | Wt 168.5 lb

## 2017-11-09 DIAGNOSIS — I481 Persistent atrial fibrillation: Secondary | ICD-10-CM

## 2017-11-09 DIAGNOSIS — I5043 Acute on chronic combined systolic (congestive) and diastolic (congestive) heart failure: Secondary | ICD-10-CM | POA: Diagnosis not present

## 2017-11-09 DIAGNOSIS — I4819 Other persistent atrial fibrillation: Secondary | ICD-10-CM

## 2017-11-09 DIAGNOSIS — I272 Pulmonary hypertension, unspecified: Secondary | ICD-10-CM | POA: Diagnosis not present

## 2017-11-09 DIAGNOSIS — E876 Hypokalemia: Secondary | ICD-10-CM | POA: Diagnosis not present

## 2017-11-09 DIAGNOSIS — J90 Pleural effusion, not elsewhere classified: Secondary | ICD-10-CM

## 2017-11-09 DIAGNOSIS — F0150 Vascular dementia without behavioral disturbance: Secondary | ICD-10-CM

## 2017-11-09 DIAGNOSIS — N182 Chronic kidney disease, stage 2 (mild): Secondary | ICD-10-CM

## 2017-11-09 MED ORDER — METOPROLOL TARTRATE 50 MG PO TABS: 50.0000 mg | ORAL_TABLET | Freq: Two times a day (BID) | ORAL | 3 refills | Status: DC

## 2017-11-09 MED ORDER — LISINOPRIL 2.5 MG PO TABS: 2.5000 mg | ORAL_TABLET | Freq: Every day | ORAL | 3 refills | Status: DC

## 2017-11-09 MED ORDER — FUROSEMIDE 20 MG PO TABS: 20.0000 mg | ORAL_TABLET | Freq: Two times a day (BID) | ORAL | 0 refills | Status: DC

## 2017-11-09 MED ORDER — POTASSIUM CHLORIDE CRYS ER 10 MEQ PO TBCR: 10.0000 meq | EXTENDED_RELEASE_TABLET | Freq: Two times a day (BID) | ORAL | 0 refills | Status: DC

## 2017-11-09 NOTE — Telephone Encounter (Signed)
Patient scheduled for appointment with Eula Listenyan Dunn today at 3 pm. Patient and daughter aware.

## 2017-11-09 NOTE — Progress Notes (Signed)
Cardiology Office Note Date:  11/09/2017  Patient ID:  Cheryl Winters, DOB [birth year], MRN [id number] PCP:  Einar Pheasant, MD  Cardiologist:  Dr. Fletcher Anon, MD    Chief Complaint: ED follow up  History of Present Illness: Cheryl Winters is a 84 y.o. female with history of recently diagnosed Afib on 11/02/2017 on Eliquis, chronic combined CHF, pulmonary hypertension, presyncope with bradycardia, mixed Alzheimer's/vascular dementia, HTN, HLD, essential tremor, GERD, arthritis and frequent UTI who presents for ED follow up of possible variable heart rates.   Patient was previously seen in 2016 in the setting of volume depletion and UTI. SHe was noted to have a heart rate in the 50s bpm on propranolol for essential tremor. Holter monitor showed NSR with short runs of SVT with occasional sinus bradycardia. Mean heart rate in the 60s bpm with a minimum heart rate of 47 bpm at 3 AM. She was tapered off propranolol with improvement in heart rates. Echo in 2016 showed an EF of 50-55%, mild to moderate mitral regurgitation with mild tricuspid regurgitation, no aortic stenosis. Prior carotid ultrasound with no evidence of obstructive disease. Nuclear stress test in 10/2015 showed no evidence of ischemia, EF 62%, low risk study. She was seen most recently by Dr. Fletcher Anon in 08/2016 for routine follow up and was doing well at that time. EKG showed sinus rhythm. Noted to have some dizziness and possible orthostatic hypotension in 09/2016. Saw neurology. MRI brain not acute.   She was seeing her PCP on 11/01/17, for dizziness, palpitations, and fatigue dating back to 08/2017. She was noted to be tachycardic with EKG showing new onset Afib with RVR, 142 bpm, no acute st/t changes. She was sent to [facility] and admitted for new onset Afib with RVR with ventricular rates in the 140s bpm. She was given IV diltiazem followed by PO metoprolol and placed on adiltiazem gtt. She was also continued on her home HCTZ and lisinopril.  Her BP dropped to 58/11 in this setting. Rapid response was called, she was given IV fluids and diltizem gtt was stopped. She was placed on an amiodarone infusion and transferred to the ICU. Troponin peaked at 0.03. BNP 366. Potassium 3.6 upon admission. TSH recently checked in 06/2017 and noted to be normal at that time. Magnesium 1.8. CBC unremarkable. Echo 11/03/17 showed EF 30-35%, diffuse HK, not technically sufficient to allow for LV diastolic function, moderate to severe MR, severely dilated left atrium measuring 54 mm, moderately dilated RV with mild systolic reduction, mildly dilated right atrium, moderate TR, PASP 55 mmHg, moderate left-sided pleural effusion. She was weaned off amiodarone and placed on digoxin with metoprolol with adequate heart rate control. Regarding her cardiomyopathy, she was also placed on lisinopril and Lasix in addition to metoprolol as above. Discharge weight of 170 pounds.   Patient called our office on 1/22 with self-reported heart rates in the 40s bpm with dizziness. She was advised to hold metoprolol and digoxin and come in for a EKF on 1/23.   Patient presented to the ED on 1/22 with dizziness/lightheadedness. In the ED she was noted to be in Afib with a ventricular rate of 95 bpm. CT head showed no acute process. Large left pleural effusion that is slightly increased from prior exam. BNP 680, troponin negative x 1, cbc unremarkable, SCr 1.02, K+ 3.4. ED MD changed her metoprolol from 75 mg bid to 25 mg bid.    She is accompanied by one of her daughters to the office today.  She notes continued intermittent shortness of breath, at times feeling like she needs to take a deep breath secondary to chest tightness.  Feels better when not wearing a bra.  Feels like lower extremity swelling may be slightly worse than her discharge swelling.  Notes a mild nonproductive cough.  BP log from home provided by the patient which demonstrates blood pressures ranging from 678 06/20/8100  systolic over 75Z-02H diastolic.  Heart rate on automated wrist cuff showing large fluctuations from a high of 78 to a low of "LOW."  There are 3 readings with a heart rate in the 40s bpm.  She denies any orthopnea.  The family feels like she is not drinking enough water.  Daughter states she hardly drinks any water.  No early satiety though family would like to transition her from 3 meals a day to small meals/snacks approximately every 2 hours.  Family is not supplementing with Boost or Ensure at this time.   Past Medical History:  Diagnosis Date  . Abdominal pain, right upper quadrant   . Benign essential tremor   . Carpal tunnel syndrome   . Chronic combined systolic and diastolic CHF (congestive heart failure) ([place])    a. TTE 10/2017: EF 30-35%, diffuse HK, not technically sufficient to allow for LV diastolic function, moderate to severe MR, severely dilated left atrium measuring 54 mm, moderately dilated RV with mild systolic reduction, mildly dilated right atrium, moderate TR, PASP 55 mmHg, moderate left-sided pleural effusion  . Dementia    a. mixed vascular and Alzheimer's  . DJD (degenerative joint disease) of knee   . GERD (gastroesophageal reflux disease)   . History of stress test    a. 10/2015 showed no evidence of ischemia, EF 62%, low risk study  . HTN (hypertension)   . Hyperlipidemia   . Persistent atrial fibrillation ([place])    a. diagnosed 10/2017; b. CHADS2VASc => 6 (CHF, HTN, age x 2, vascular disease, female); c. on Eliquis  . Syncope and collapse   . Ulnar nerve lesion   . UTI (urinary tract infection)   . Vitamin D deficiency     Past Surgical History:  Procedure Laterality Date  . Bilateral Bletharoplastices    . BREAST BIOPSY  1957 and 1962   x 2, Nml per pt  . BREAST CYST EXCISION     x 2 - nml path per pt  . CARPAL TUNNEL RELEASE  2004  . CATARACT EXTRACTION    . TOTAL KNEE ARTHROPLASTY     bilateral, total, Rondall Allegra  . TOTAL SHOULDER REPLACEMENT   08/2010   left  . TUBAL LIGATION    . UMBILICAL HERNIA REPAIR    . VAGINAL DELIVERY     1 set Twins/2 single births    Current Meds  Medication Sig  . alum & mag hydroxide-simeth (MAALOX/MYLANTA) 200-200-20 MG/5ML suspension Take 30 mLs by mouth every 6 (six) hours as needed for indigestion or heartburn.  Marland Kitchen apixaban (ELIQUIS) 5 MG TABS tablet Take 1 tablet (5 mg total) by mouth 2 (two) times daily.  . furosemide (LASIX) 20 MG tablet Take 1 tablet (20 mg total) by mouth daily.  Marland Kitchen lisinopril (PRINIVIL,ZESTRIL) 5 MG tablet Take 1 tablet (5 mg total) by mouth daily.  . metoprolol tartrate (LOPRESSOR) 25 MG tablet Take 25 mg by mouth 2 (two) times daily.  . potassium chloride (K-DUR,KLOR-CON) 10 MEQ tablet Take 1 tablet (10 mEq total) by mouth daily.    Allergies:   Amlodipine; Amoxicillin-pot  clavulanate; Penicillins; Codeine; and Codeine sulfate   Social History:  The patient  reports that  has never smoked. she has never used smokeless tobacco. She reports that she does not drink alcohol or use drugs.   Family History:  The patient's family history includes Alcohol abuse in her other; Arthritis in her mother; Brain cancer (age of onset: 4) in her father; Breast cancer in her maternal aunt; Dementia in her mother; Early death (age of onset: 54) in her father; Hypertension in her mother; Stroke in her mother.  ROS:   Review of Systems  Constitutional: Positive for malaise/fatigue. Negative for chills, diaphoresis, fever and weight loss.  HENT: Negative for congestion.   Eyes: Negative for discharge and redness.  Respiratory: Positive for cough and shortness of breath. Negative for hemoptysis, sputum production and wheezing.   Cardiovascular: Positive for leg swelling. Negative for chest pain, palpitations, orthopnea, claudication and PND.  Gastrointestinal: Negative for abdominal pain, blood in stool, heartburn, melena, nausea and vomiting.  Genitourinary: Negative for hematuria.    Musculoskeletal: Negative for falls and myalgias.  Skin: Negative for rash.  Neurological: Positive for dizziness and weakness. Negative for tingling, tremors, sensory change, speech change, focal weakness and loss of consciousness.  Endo/Heme/Allergies: Does not bruise/bleed easily.  Psychiatric/Behavioral: Negative for substance abuse. The patient is not nervous/anxious.   All other systems reviewed and are negative.    PHYSICAL EXAM:  VS:  BP 132/80 (BP Location: Left Arm, Patient Position: Sitting, Cuff Size: Normal)   Pulse (!) 103   Ht _0  (1.499 m)   Wt 168 lb 8 oz (76.4 kg)   BMI 34.03 kg/m  BMI: Body mass index is 34.03 kg/m.  Physical Exam  Constitutional: She is oriented to person, place, and time. She appears well-developed and well-nourished.  Essential tremor noted  HENT:  Head: Normocephalic and atraumatic.  Eyes: Right eye exhibits no discharge. Left eye exhibits no discharge.  Neck: Normal range of motion. No JVD present.  Cardiovascular: S1 normal and S2 normal. An irregularly irregular rhythm present. Tachycardia present. Exam reveals no distant heart sounds, no friction rub, no midsystolic click and no opening snap.  Murmur heard. High-pitched blowing holosystolic murmur is present with a grade of 2/6 at the apex. Pulses:      Posterior tibial pulses are 1+ on the right side, and 1+ on the left side.  Pulmonary/Chest: Effort normal. No accessory muscle usage. No apnea, no tachypnea and no bradypnea. No respiratory distress. She has decreased breath sounds in the left lower field. She has no wheezes. She has no rhonchi. She has rales in the left middle field. She exhibits no tenderness.  Abdominal: Soft. She exhibits no distension. There is no tenderness.  Musculoskeletal: She exhibits edema.  Trace bilateral lower extremity pitting edema to the mid shins  Neurological: She is alert and oriented to person, place, and time.  Skin: Skin is warm and dry. No  cyanosis. Nails show no clubbing.  Psychiatric: She has a normal mood and affect. Her speech is normal and behavior is normal. Judgment and thought content normal.     EKG:  Was ordered and interpreted by me today. Shows A. fib with RVR, 103 bpm, rare PVC, normal axis, nonspecific anterolateral ST-T changes  Recent Labs: 07/15/2017: TSH 2.78 11/02/2017: Magnesium 1.9 11/08/2017: ALT 25; B Natriuretic Peptide 680.0; BUN 23; Creatinine, Ser 1.02; Hemoglobin 13.2; Platelets 218; Potassium 3.4; Sodium 140  07/15/2017: Cholesterol 245; HDL 64.10; LDL Cholesterol 169; Total CHOL/HDL  Ratio 4; Triglycerides 59.0; VLDL 11.8   Estimated Creatinine Clearance: 38.6 mL/min (A) (by C-G formula based on SCr of 1.02 mg/dL (H)).   Wt Readings from Last 3 Encounters:  11/09/17 168 lb 8 oz (76.4 kg)  11/08/17 170 lb (77.1 kg)  11/04/17 170 lb 1.6 oz (77.2 kg)    Orthostatic vital signs: Lying: 132/84, 73 bpm Sitting: 120/80, 48 bpm, lightheaded Standing: 112/88, 63 bpm Standing times 3 minutes: 118/88, 71 bpm  Other studies reviewed: Additional studies/records reviewed today include: summarized above  ASSESSMENT AND PLAN:  1. Persistent A. fib with RVR: Rate is reasonably controlled in the low 100s bpm in the office today.  Suspect the documented heart rate of 48 bpm during orthostatics is erroneous as this was taken with a pulse oximeter.  Also suspect the patient reported home heart rate readings in the 40s were erroneous as these were taken with an automated wrist BP cuff.  However, patient does have a history of bradycardia on beta-blockers and had to previously have these held due to bradycardia.  We will need to keep a close eye on her heart rates on beta-blockade therapy.  If such a wide range of heart rates continue to be reported she may benefit from 24-48-hour Holter monitoring to further evaluate this.  Continue Eliquis 5 mg twice daily as she does not meet 2 of 3 reduced dosing criteria at this  time.  Given she has not been adequately anticoagulated and with her left atrial dimension of 54 mm she is unlikely to be a candidate for successful cardioversion at this time.  However, should she demonstrate continued symptoms of shortness of breath and difficult to control ventricular rates she may benefit from outpatient electrical cardioversion with amiodarone loading given her left atrial dimension after she has been adequately anticoagulated.  Increase metoprolol to 50 mg twice daily.  2. Acute on chronic combined CHF NYHA class III/pulmonary hypertension/left-sided pleural effusion: Chest x-ray taken in the ED on 1/22 demonstrated enlargement of the left-sided pleural effusion.  Suspect this is in the setting of poorly controlled A. fib with RVR.  Metoprolol increased as above.  Increase Lasix to 20 mg twice daily with added potassium repletion as detailed below.  Plan for recheck BNP, BMP, CBC, and chest x-ray on 1/28.  If there is not significant interval improvement and the left pleural effusion she may benefit from noncontrast chest CT to evaluate if this is loculated and possibly amenable to thoracentesis.  Patient and daughter would prefer to avoid thoracentesis at this time.  She may be required to hold Eliquis prior to potential thoracentesis if indicated.  I also decreased her lisinopril to 2.5 mg daily as we have escalated her Lopressor and Lasix, to augment heart rate control and diuresis, as I want to avoid significant hypotension.  3. Hypokalemia: Patient to take 30 mEq of KCl this afternoon followed by increased KCl of 10 mEq twice daily with added diuresis as above.  Recheck be met on 1/28.  4. CKD stage II: Monitor with diuresis.  Check be met on 128.  5. Mixed vascular and Alzheimer's dementia: Followed by PCP.  Discussed with Dr. Saunders Revel.  Disposition: F/u with Dr. Fletcher Anon, MD on 1/29  Current medicines are reviewed at length with the patient today.  The patient did not have any  concerns regarding medicines.  Signed, Christell Faith, PA-C 11/09/2017 3:42 PM     [address] [place], [place] [address] [phone]

## 2017-11-09 NOTE — Telephone Encounter (Signed)
Per Dr. Okey DupreEnd, pt needs f/u ED OV 1-2 days. S/w pt's daughter Rory PercyJayme who is agreeable to 3pm OV today with Eula Listenyan Dunn, PA-C She reports her mother is feeling better today. Pre-medication BP 138/119, HR 72 Metoprolol was decreased to 25mg  BID in the ED last evening. Jayme will give her morning medications now and continue to monitor VS.

## 2017-11-09 NOTE — Progress Notes (Unsigned)
metopr

## 2017-11-09 NOTE — Patient Instructions (Signed)
Medication Instructions:  Your physician has recommended you make the following change in your medication:  DECREASE lisinopril to 2.5mg  once daily INCREASE metoprolol to 50mg  twice daily INCREASE lasix to 20mg  twice daily Take an extra 30mEq of potassium this evening STARTING tomorrow, increase potassium to 10mEq twice daily   Labwork: BMET, CBC, [facility] No appointment needed.  Testing/Procedures: A chest x-ray takes a picture of the organs and structures inside the chest, including the heart, lungs, and blood vessels. This test can show several things, including, whether the heart is enlarges; whether fluid is building up in the lungs; and whether pacemaker / defibrillator leads are still in place. [facility] No appointment needed.    Follow-Up: Your physician recommends that you schedule a follow-up appointment with Dr. Kirke CorinArida on Tuesday, January 29 at 4:40pm.    Any Other Special Instructions Will Be Listed Below (If Applicable).     If you need a refill on your cardiac medications before your next appointment, please call your pharmacy.

## 2017-11-10 ENCOUNTER — Telehealth: Payer: Self-pay | Admitting: Internal Medicine

## 2017-11-10 NOTE — Telephone Encounter (Signed)
S/w patient's daughter ok per DPR. Her sister was the one who came with patient yesterday to appointment and is saying patient does not need the walker any more and that she was told at appointment that patient could use a cane instead.  Patient will soon have PT and OT as well as Home Health RN coming to visit patient. Advised patient should continue using walker until further assessment by PT and OT and follow their recommendations.  Patient is also at a 6 hour "Adult daycare" which she goes to 4 times a week. Daughter wants to make sure patient is appropriate to go. They do activities mostly while sitting such as crafts or light "pool noodle" exercises.  She may need a letter faxed to program saying patient may participate. Routing to Albertson'syan Dunn, PA-C for advice and ok to send letter?

## 2017-11-10 NOTE — Telephone Encounter (Signed)
Copied from CRM [phone]. Topic: Quick Communication - See Telephone Encounter >> Nov 10, 2017  2:16 PM Terisa Starraylor, Brittany L wrote: CRM for notification. See Telephone encounter for:   11/10/17.  [facility] verbals for OT two week one & one week one [phone]

## 2017-11-10 NOTE — Telephone Encounter (Signed)
Patient daughter called to clarify patient needs for cane vs walker  She was told by sibling that Alycia RossettiRyan specifically said patient doesn't need walker   Patient is at a 6 hr memory program and daughter was supposed to ask ryan if patient is clear to do this today   Please call asap

## 2017-11-10 NOTE — Telephone Encounter (Signed)
I distinctly remember I did not say either way.  Would prefer patient use walker as this would provide more stability for her.  Agree with home PT/OT.  I would like for her to hold off on her adult daycare until she has been followed up with and we can reassess her repeat chest x-ray and see how she is feeling overall following diuresis.

## 2017-11-10 NOTE — Telephone Encounter (Signed)
Verbals given to Esther ?

## 2017-11-10 NOTE — Telephone Encounter (Signed)
Placed in your folder for quick sign. Patient is coming in for an appointment with Dr. Shirlee LatchMcLean. She can pick up while she is here if possible.

## 2017-11-10 NOTE — Telephone Encounter (Signed)
Returned call to patient's daughter. She verbalized understanding of Ryan's recommendations. She expressed frustration with her sister and the lack of communication from her sister. Her sister was "making things up" about the office visit. We discussed and went over the patient's AVS. She was very appreciate and has the AVS.

## 2017-11-10 NOTE — Telephone Encounter (Signed)
Please advise 

## 2017-11-10 NOTE — Telephone Encounter (Signed)
Signed and placed given to you.

## 2017-11-11 ENCOUNTER — Ambulatory Visit (INDEPENDENT_AMBULATORY_CARE_PROVIDER_SITE_OTHER): Payer: Medicare Other | Admitting: Internal Medicine

## 2017-11-11 ENCOUNTER — Encounter: Payer: Self-pay | Admitting: Internal Medicine

## 2017-11-11 ENCOUNTER — Telehealth: Payer: Self-pay | Admitting: Internal Medicine

## 2017-11-11 VITALS — BP 118/82 | HR 100 | Temp 97.5°F | Ht 59.0 in | Wt 167.0 lb

## 2017-11-11 DIAGNOSIS — I420 Dilated cardiomyopathy: Secondary | ICD-10-CM

## 2017-11-11 DIAGNOSIS — R0602 Shortness of breath: Secondary | ICD-10-CM | POA: Diagnosis not present

## 2017-11-11 DIAGNOSIS — I481 Persistent atrial fibrillation: Secondary | ICD-10-CM | POA: Diagnosis not present

## 2017-11-11 DIAGNOSIS — R2689 Other abnormalities of gait and mobility: Secondary | ICD-10-CM

## 2017-11-11 DIAGNOSIS — R413 Other amnesia: Secondary | ICD-10-CM

## 2017-11-11 DIAGNOSIS — I951 Orthostatic hypotension: Secondary | ICD-10-CM | POA: Diagnosis not present

## 2017-11-11 DIAGNOSIS — J90 Pleural effusion, not elsewhere classified: Secondary | ICD-10-CM | POA: Diagnosis not present

## 2017-11-11 DIAGNOSIS — I4819 Other persistent atrial fibrillation: Secondary | ICD-10-CM

## 2017-11-11 NOTE — Telephone Encounter (Unsigned)
Copied from CRM [phone]. Topic: General - Other >> Nov 11, 2017  3:48 PM Viviann SpareWhite, Selina wrote: Reason for CRM: Patient daughter call because she would like to know if Dr. Lorin PicketScott recommend a walking device for the patient if so, should the patient be using it all the time or just when she need it. Also, the patient is receiving 24 hours care, her daughter would like to know if the doctor think she really need around the clock care or can she be by herself during the day or at night. Rory PercyJayme is requesting a call back @ [phone].

## 2017-11-11 NOTE — Telephone Encounter (Signed)
Verbals given to Amy from [facility].

## 2017-11-11 NOTE — Patient Instructions (Addendum)
Please f/u in 2 weeks sooner if needed    Pleural Effusion A pleural effusion is an abnormal buildup of fluid in the layers of tissue between your lungs and the inside of your chest (pleural space). These two layers of tissue that line both your lungs and the inside of your chest are called pleura. Usually, there is no air in the space between the pleura, only a thin layer of fluid. If left untreated, a large amount of fluid can build up and cause the lung to collapse. A pleural effusion is usually caused by another disease that requires treatment. The two main types of pleural effusion are:  Transudative pleural effusion. This happens when fluid leaks into the pleural space because of a low protein count in your blood or high blood pressure in your vessels. Heart failure often causes this.  Exudative infusion. This occurs when fluid collects in the pleural space from blocked blood vessels or lymph vessels. Some lung diseases, injuries, and cancers can cause this type of effusion.  What are the causes? Pleural effusion can be caused by:  Heart failure.  A blood clot in the lung (pulmonary embolism).  Pneumonia.  Cancer.  Liver failure (cirrhosis).  Kidney disease.  Complications from surgery, such as from open heart surgery.  What are the signs or symptoms? In some cases, pleural effusion may cause no symptoms. Symptoms can include:  Shortness of breath, especially when lying down.  Chest pain, often worse when taking a deep breath.  Fever.  Dry cough that is lasting (chronic).  Hiccups.  Rapid breathing.  An underlying condition that is causing the pleural effusion (such as heart failure, pneumonia, blood clots, tuberculosis, or cancer) may also cause additional symptoms. How is this diagnosed? Your health care provider may suspect pleural effusion based on your symptoms and medical history. Your health care provider will also do a physical exam and a chest X-ray. If the  X-ray shows there is fluid in your chest, you may need to have this fluid removed using a needle (thoracentesis) so it can be tested. You may also have:  Imaging studies of the chest, such as: ? Ultrasound. ? CT scan.  Blood tests for kidney and liver function.  How is this treated? Treatment depends on the cause of the pleural effusion. Treatment may include:  Taking antibiotic medicines to clear up an infection that is causing the pleural effusion.  Placing a tube in the chest to drain the effusion (tube thoracostomy). This procedure is often used when there is an infection in the fluid.  Surgery to remove the fibrous outer layer of tissue from the pleural space (decortication).  Thoracentesis, which can improve cough and shortness of breath.  A procedure to put medicine into the chest cavity to seal the pleural space to prevent fluid buildup (pleurodesis).  Chemotherapy and radiation therapy. These may be required in the case of cancerous (malignant) pleural effusion.  Follow these instructions at home:  Take medicines only as directed by your health care provider.  Keep track of how long you can gently exercise before you get short of breath. Try simply walking at first.  Do not use any tobacco products, including cigarettes, chewing tobacco, or electronic cigarettes. If you need help quitting, ask your health care provider.  Keep all follow-up visits as directed by your health care provider. This is important. Contact a health care provider if:  The amount of time that you are able to exercise decreases or does not  improve with time.  You have pain or signs of infection at the puncture site if you had thoracentesis. Watch for: ? Drainage. ? Redness. ? Swelling.  You have a fever. Get help right away if:  You are short of breath.  You develop chest pain.  You develop a new cough. This information is not intended to replace advice given to you by your health care  provider. Make sure you discuss any questions you have with your health care provider. Document Released: 10/04/2005 Document Revised: 03/08/2016 Document Reviewed: 02/27/2014 Elsevier Interactive Patient Education  2018 [employer]  Heart Failure Heart failure is a condition in which the heart has trouble pumping blood because it has become weak or stiff. This means that the heart does not pump blood efficiently for the body to work well. For some people with heart failure, fluid may back up into the lungs and there may be swelling (edema) in the lower legs. Heart failure is usually a long-term (chronic) condition. It is important for you to take good care of yourself and follow the treatment plan from your health care provider. What are the causes? This condition is caused by some health problems, including:  High blood pressure (hypertension). Hypertension causes the heart muscle to work harder than normal. High blood pressure eventually causes the heart to become stiff and weak.  Coronary artery disease (CAD). CAD is the buildup of cholesterol and fat (plaques) in the arteries of the heart.  Heart attack (myocardial infarction). Injured tissue, which is caused by the heart attack, does not contract as well and the heart's ability to pump blood is weakened.  Abnormal heart valves. When the heart valves do not open and close properly, the heart muscle must pump harder to keep the blood flowing.  Heart muscle disease (cardiomyopathy or myocarditis). Heart muscle disease is damage to the heart muscle from a variety of causes, such as drug or alcohol abuse, infections, or unknown causes. These can increase the risk of heart failure.  Lung disease. When the lungs do not work properly, the heart must work harder.  What increases the risk? Risk of heart failure increases as a person ages. This condition is also more likely to develop in people who:  Are overweight.  Are female.  Smoke or  chew tobacco.  Abuse alcohol or illegal drugs.  Have taken medicines that can damage the heart, such as chemotherapy drugs.  Have diabetes. ? High blood sugar (glucose) is associated with high fat (lipid) levels in the blood. ? Diabetes can also damage tiny blood vessels that carry nutrients to the heart muscle.  Have abnormal heart rhythms.  Have thyroid problems.  Have low blood counts (anemia).  What are the signs or symptoms? Symptoms of this condition include:  Shortness of breath with activity, such as when climbing stairs.  Persistent cough.  Swelling of the feet, ankles, legs, or abdomen.  Unexplained weight gain.  Difficulty breathing when lying flat (orthopnea).  Waking from sleep because of the need to sit up and get more air.  Rapid heartbeat.  Fatigue and loss of energy.  Feeling light-headed, dizzy, or close to fainting.  Loss of appetite.  Nausea.  Increased urination during the night (nocturia).  Confusion.  How is this diagnosed? This condition is diagnosed based on:  Medical history, symptoms, and a physical exam.  Diagnostic tests, which may include: ? Echocardiogram. ? Electrocardiogram (ECG). ? Chest X-ray. ? Blood tests. ? Exercise stress test. ? Radionuclide scans. ?  Cardiac catheterization and angiogram.  How is this treated? Treatment for this condition is aimed at managing the symptoms of heart failure. Medicines, behavioral changes, or other treatments may be necessary to treat heart failure. Medicines These may include:  Angiotensin-converting enzyme (ACE) inhibitors. This type of medicine blocks the effects of a blood protein called angiotensin-converting enzyme. ACE inhibitors relax (dilate) the blood vessels and help to lower blood pressure.  Angiotensin receptor blockers (ARBs). This type of medicine blocks the actions of a blood protein called angiotensin. ARBs dilate the blood vessels and help to lower blood  pressure.  Water pills (diuretics). Diuretics cause the kidneys to remove salt and water from the blood. The extra fluid is removed through urination, leaving a lower volume of blood that the heart has to pump.  Beta blockers. These improve heart muscle strength and they prevent the heart from beating too quickly.  Digoxin. This increases the force of the heartbeat.  Healthy behavior changes These may include:  Reaching and maintaining a healthy weight.  Stopping smoking or chewing tobacco.  Eating heart-healthy foods.  Limiting or avoiding alcohol.  Stopping use of street drugs (illegal drugs).  Physical activity.  Other treatments These may include:  Surgery to open blocked coronary arteries or repair damaged heart valves.  Placement of a biventricular pacemaker to improve heart muscle function (cardiac resynchronization therapy). This device paces both the right ventricle and left ventricle.  Placement of a device to treat serious abnormal heart rhythms (implantable cardioverter defibrillator, or ICD).  Placement of a device to improve the pumping ability of the heart (left ventricular assist device, or LVAD).  Heart transplant. This can cure heart failure, and it is considered for certain patients who do not improve with other therapies.  Follow these instructions at home: Medicines  Take over-the-counter and prescription medicines only as told by your health care provider. Medicines are important in reducing the workload of your heart, slowing the progression of heart failure, and improving your symptoms. ? Do not stop taking your medicine unless your health care provider told you to do that. ? Do not skip any dose of medicine. ? Refill your prescriptions before you run out of medicine. You need your medicines every day. Eating and drinking   Eat heart-healthy foods. Talk with a dietitian to make an eating plan that is right for you. ? Choose foods that contain no  trans fat and are low in saturated fat and cholesterol. Healthy choices include fresh or frozen fruits and vegetables, fish, lean meats, legumes, fat-free or low-fat dairy products, and whole-grain or high-fiber foods. ? Limit salt (sodium) if directed by your health care provider. Sodium restriction may reduce symptoms of heart failure. Ask a dietitian to recommend heart-healthy seasonings. ? Use healthy cooking methods instead of frying. Healthy methods include roasting, grilling, broiling, baking, poaching, steaming, and stir-frying.  Limit your fluid intake if directed by your health care provider. Fluid restriction may reduce symptoms of heart failure. Lifestyle  Stop smoking or using chewing tobacco. Nicotine and tobacco can damage your heart and your blood vessels. Do not use nicotine gum or patches before talking to your health care provider.  Limit alcohol intake to no more than 1 drink per day for non-pregnant women and 2 drinks per day for men. One drink equals 12 oz of beer, 5 oz of wine, or 1 oz of hard liquor. ? Drinking more than that is harmful to your heart. Tell your health care provider if you drink  alcohol several times a week. ? Talk with your health care provider about whether any level of alcohol use is safe for you. ? If your heart has already been damaged by alcohol or you have severe heart failure, drinking alcohol should be stopped completely.  Stop use of illegal drugs.  Lose weight if directed by your health care provider. Weight loss may reduce symptoms of heart failure.  Do moderate physical activity if directed by your health care provider. People who are elderly and people with severe heart failure should consult with a health care provider for physical activity recommendations. Monitor important information  Weigh yourself every day. Keeping track of your weight daily helps you to notice excess fluid sooner. ? Weigh yourself every morning after you urinate and  before you eat breakfast. ? Wear the same amount of clothing each time you weigh yourself. ? Record your daily weight. Provide your health care provider with your weight record.  Monitor and record your blood pressure as told by your health care provider.  Check your pulse as told by your health care provider. Dealing with extreme temperatures  If the weather is extremely hot: ? Avoid vigorous physical activity. ? Use air conditioning or fans or seek a cooler location. ? Avoid caffeine and alcohol. ? Wear loose-fitting, lightweight, and light-colored clothing.  If the weather is extremely cold: ? Avoid vigorous physical activity. ? Layer your clothes. ? Wear mittens or gloves, a hat, and a scarf when you go outside. ? Avoid alcohol. General instructions  Manage other health conditions such as hypertension, diabetes, thyroid disease, or abnormal heart rhythms as told by your health care provider.  Learn to manage stress. If you need help to do this, ask your health care provider.  Plan rest periods when fatigued.  Get ongoing education and support as needed.  Participate in or seek rehabilitation as needed to maintain or improve independence and quality of life.  Stay up to date with immunizations. Keeping current on pneumococcal and influenza immunizations is especially important to prevent respiratory infections.  Keep all follow-up visits as told by your health care provider. This is important. Contact a health care provider if:  You have a rapid weight gain.  You have increasing shortness of breath that is unusual for you.  You are unable to participate in your usual physical activities.  You tire easily.  You cough more than normal, especially with physical activity.  You have any swelling or more swelling in areas such as your hands, feet, ankles, or abdomen.  You are unable to sleep because it is hard to breathe.  You feel like your heart is beating quickly  (palpitations).  You become dizzy or light-headed when you stand up. Get help right away if:  You have difficulty breathing.  You notice or your family notices a change in your awareness, such as having trouble staying awake or having difficulty with concentration.  You have pain or discomfort in your chest.  You have an episode of fainting (syncope). This information is not intended to replace advice given to you by your health care provider. Make sure you discuss any questions you have with your health care provider. Document Released: 10/04/2005 Document Revised: 06/08/2016 Document Reviewed: 04/28/2016 Elsevier Interactive Patient Education  [employer]2018 [employer]

## 2017-11-11 NOTE — Telephone Encounter (Unsigned)
Copied from CRM [id number]. Topic: Quick Communication - See Telephone Encounter >> Nov 11, 2017  9:01 AM Floria RavelingStovall, Shana A wrote: CRM for notification. See Telephone encounter for: Amy with [facility]  [phone] Need verbals for Nursings, new med education  2 week 2 1 week 4 1 every other week for 3 weeks   11/11/17.

## 2017-11-11 NOTE — Telephone Encounter (Signed)
Please advise 

## 2017-11-11 NOTE — Telephone Encounter (Signed)
Given to BenedictKristen to give to them while they are here today.

## 2017-11-11 NOTE — Telephone Encounter (Signed)
Faxed referral to [facility]

## 2017-11-11 NOTE — Telephone Encounter (Signed)
Verbals given to [place] for PT x 2 weeks.

## 2017-11-11 NOTE — Telephone Encounter (Signed)
Ok

## 2017-11-11 NOTE — Telephone Encounter (Unsigned)
Copied from CRM [id number]. Topic: Quick Communication - See Telephone Encounter >> Nov 11, 2017 10:28 AM Floria RavelingStovall, Shana A wrote: CRM for notification. See Telephone encounter for: Cheryl Winters  [facility] [phone] Need verbals for PT  2 week 2   11/11/17.

## 2017-11-13 NOTE — Telephone Encounter (Signed)
Please call and notify that I would recommend a walker - this will give her extra stability.  I would recommend using now all of the time, until assessment from physical therapy.  Also, would recommend continued 24 hour care until physical therapy can assess her needs.  Have already given order for PT/OT to start.

## 2017-11-14 ENCOUNTER — Ambulatory Visit
Admission: RE | Admit: 2017-11-14 | Discharge: 2017-11-14 | Disposition: A | Discharge status: Home / Self Care | Payer: Medicare Other | Source: Ambulatory Visit | Attending: Physician Assistant | Admitting: Physician Assistant

## 2017-11-14 ENCOUNTER — Other Ambulatory Visit
Admission: RE | Admit: 2017-11-14 | Discharge: 2017-11-14 | Disposition: A | Discharge status: Home / Self Care | Payer: Medicare Other | Source: Ambulatory Visit | Attending: Physician Assistant | Admitting: Physician Assistant

## 2017-11-14 ENCOUNTER — Telehealth: Payer: Self-pay

## 2017-11-14 DIAGNOSIS — I700 Atherosclerosis of aorta: Secondary | ICD-10-CM | POA: Diagnosis not present

## 2017-11-14 DIAGNOSIS — I517 Cardiomegaly: Secondary | ICD-10-CM | POA: Insufficient documentation

## 2017-11-14 DIAGNOSIS — J90 Pleural effusion, not elsewhere classified: Secondary | ICD-10-CM

## 2017-11-14 DIAGNOSIS — I4819 Other persistent atrial fibrillation: Secondary | ICD-10-CM

## 2017-11-14 DIAGNOSIS — I4891 Unspecified atrial fibrillation: Secondary | ICD-10-CM

## 2017-11-14 DIAGNOSIS — J984 Other disorders of lung: Secondary | ICD-10-CM | POA: Diagnosis not present

## 2017-11-14 DIAGNOSIS — R0602 Shortness of breath: Secondary | ICD-10-CM

## 2017-11-14 LAB — CBC
HCT: 40 % (ref 35.0–47.0)
Hemoglobin: 13.2 g/dL (ref 12.0–16.0)
MCH: 29 pg (ref 26.0–34.0)
MCHC: 33.1 g/dL (ref 32.0–36.0)
MCV: 87.6 fL (ref 80.0–100.0)
PLATELETS: 201 10*3/uL (ref 150–440)
RBC: 4.57 MIL/uL (ref 3.80–5.20)
RDW: 14.5 % (ref 11.5–14.5)
WBC: 6 10*3/uL (ref 3.6–11.0)

## 2017-11-14 LAB — BASIC METABOLIC PANEL
Anion gap: 8 (ref 5–15)
BUN: 22 mg/dL — AB (ref 6–20)
CALCIUM: 9.2 mg/dL (ref 8.9–10.3)
CO2: 31 mmol/L (ref 22–32)
Chloride: 101 mmol/L (ref 101–111)
Creatinine, Ser: 0.96 mg/dL (ref 0.44–1.00)
GFR calc Af Amer: >60 mL/min (ref >60)
GFR, EST NON AFRICAN AMERICAN: 54 mL/min — AB (ref >60)
GLUCOSE: 102 mg/dL — AB (ref 65–99)
Potassium: 3.6 mmol/L (ref 3.5–5.1)
SODIUM: 140 mmol/L (ref 135–145)

## 2017-11-14 LAB — BRAIN NATRIURETIC PEPTIDE: B Natriuretic Peptide: 648 pg/mL — ABNORMAL HIGH (ref 0.0–100.0)

## 2017-11-14 LAB — DIGOXIN LEVEL: Digoxin Level: <0.2 ng/mL — ABNORMAL LOW (ref 0.8–2.0)

## 2017-11-14 NOTE — Telephone Encounter (Signed)
Patient DPR stated PT saw patient on Friday and recommendations were to use cane when out and no walker or cane at home that patient was stable in home.  Patient DPR dis agrees she feels her mother is weak in the afternoons and needs something more stable such as a walker. Patient daughter feels patient  Is not as active as in the past and needs something more stable.

## 2017-11-14 NOTE — Telephone Encounter (Signed)
I am ok with walker if feel needs.  Do they need a rx for walke?

## 2017-11-15 ENCOUNTER — Encounter: Payer: Self-pay | Admitting: Cardiovascular Disease

## 2017-11-15 ENCOUNTER — Ambulatory Visit (INDEPENDENT_AMBULATORY_CARE_PROVIDER_SITE_OTHER): Payer: Medicare Other | Admitting: Cardiovascular Disease

## 2017-11-15 ENCOUNTER — Encounter: Payer: Self-pay | Admitting: Internal Medicine

## 2017-11-15 VITALS — BP 120/80 | HR 106 | Ht 59.0 in | Wt 166.2 lb

## 2017-11-15 DIAGNOSIS — I5022 Chronic systolic (congestive) heart failure: Secondary | ICD-10-CM | POA: Diagnosis not present

## 2017-11-15 DIAGNOSIS — I10 Essential (primary) hypertension: Secondary | ICD-10-CM

## 2017-11-15 DIAGNOSIS — I4891 Unspecified atrial fibrillation: Secondary | ICD-10-CM | POA: Diagnosis not present

## 2017-11-15 DIAGNOSIS — J90 Pleural effusion, not elsewhere classified: Secondary | ICD-10-CM

## 2017-11-15 NOTE — Progress Notes (Signed)
Chief Complaint  Patient presents with  . Hospitalization Follow-up   Hospital f/u with daughter 1. Daughter brings in paperwork to be filled out for pt to participate in [place]  2. She c/o dizziness orthostatics checked today lying 130/82 HR 53 O2 91%, sitting 123/90 HR 57 O2 93%, standing HR 120/78 HR 51 O2 92%. Orthostatics +. She feels like her head is cloudy and jittery and dizzy since recent med changes by cardiology. 3. She reports reduce appetite and feels like food is getting stuck though denies trouble swallowing or choking on foods of liquids  4. She was seen recently in hospital for syncope admitted 1/15-1/18/19 was found to have large left pleural effusion noted on serial CXR, echo with EF 30-35% diffuse HK and valve abnormalities and elevated PAP 55 mmHg. Daughter and pt were not aware of this. She also has been having Afib with RVR.Marland Kitchen She will have f/u cards Monday for repeat CXR, labs BMET, CBC, BNP 5. She is staying with daughter but was living independently before this they want to know what plan of action should be daughter is c/w worsening memory Neurology informed daughter to have Neurology test for decision making capacity daughter is thinking pt should not be managing her bills 6. She will come back for PPD skin test Monday    Review of Systems  Constitutional: Negative for weight loss.  HENT: Negative for hearing loss.   Eyes: Negative for blurred vision.  Respiratory: Positive for shortness of breath.   Cardiovascular: Positive for leg swelling.       Chronic left leg swelling >R  Gastrointestinal:       Reduce appetite   Musculoskeletal: Positive for falls.  Skin: Negative for rash.  Neurological: Positive for dizziness and loss of consciousness.  Psychiatric/Behavioral: Positive for memory loss.   Past Medical History:  Diagnosis Date  . Abdominal pain, right upper quadrant   . Benign essential tremor   . Carpal tunnel syndrome   . Chronic combined  systolic and diastolic CHF (congestive heart failure) ([place])    a. TTE 10/2017: EF 30-35%, diffuse HK, not technically sufficient to allow for LV diastolic function, moderate to severe MR, severely dilated left atrium measuring 54 mm, moderately dilated RV with mild systolic reduction, mildly dilated right atrium, moderate TR, PASP 55 mmHg, moderate left-sided pleural effusion  . Dementia    a. mixed vascular and Alzheimer's  . DJD (degenerative joint disease) of knee   . GERD (gastroesophageal reflux disease)   . History of stress test    a. 10/2015 showed no evidence of ischemia, EF 62%, low risk study  . HTN (hypertension)   . Hyperlipidemia   . Persistent atrial fibrillation ([place])    a. diagnosed 10/2017; b. CHADS2VASc => 6 (CHF, HTN, age x 2, vascular disease, female); c. on Eliquis  . Syncope and collapse   . Systolic CHF ([place])   . Ulnar nerve lesion   . UTI (urinary tract infection)   . Vitamin D deficiency    Past Surgical History:  Procedure Laterality Date  . Bilateral Bletharoplastices    . BREAST BIOPSY  1957 and 1962   x 2, Nml per pt  . BREAST CYST EXCISION     x 2 - nml path per pt  . CARPAL TUNNEL RELEASE  2004  . CATARACT EXTRACTION    . TOTAL KNEE ARTHROPLASTY     bilateral, total, Rondall Allegra  . TOTAL SHOULDER REPLACEMENT  08/2010   left  .  TUBAL LIGATION    . UMBILICAL HERNIA REPAIR    . VAGINAL DELIVERY     1 set Twins/2 single births   Family History  Problem Relation Age of Onset  . Hypertension Mother   . Arthritis Mother   . Stroke Mother   . Dementia Mother   . Early death Father 83       from brain cancer  . Brain cancer Father 9  . Breast cancer Maternal Aunt   . Alcohol abuse Other    Social History   Socioeconomic History  . Marital status: Widowed    Spouse name: Not on file  . Number of children: 4  . Years of education: Not on file  . Highest education level: Not on file  Social Needs  . Financial resource strain: Not hard at  all  . Food insecurity - worry: Never true  . Food insecurity - inability: Never true  . Transportation needs - medical: No  . Transportation needs - non-medical: No  Occupational History  . Occupation: Retired - Producer, television/film/video  Tobacco Use  . Smoking status: Never Smoker  . Smokeless tobacco: Never Used  Substance and Sexual Activity  . Alcohol use: No  . Drug use: No  . Sexual activity: Not Currently  Other Topics Concern  . Not on file  Social History Narrative   Lives alone in [place]. Widow. Has children who live nearby.   Current Meds  Medication Sig  . alum & mag hydroxide-simeth (MAALOX/MYLANTA) 200-200-20 MG/5ML suspension Take 30 mLs by mouth every 6 (six) hours as needed for indigestion or heartburn.  Marland Kitchen apixaban (ELIQUIS) 5 MG TABS tablet Take 1 tablet (5 mg total) by mouth 2 (two) times daily.  . furosemide (LASIX) 20 MG tablet Take 1 tablet (20 mg total) by mouth 2 (two) times daily.  Marland Kitchen lisinopril (PRINIVIL,ZESTRIL) 2.5 MG tablet Take 1 tablet (2.5 mg total) by mouth daily.  . metoprolol tartrate (LOPRESSOR) 50 MG tablet Take 1 tablet (50 mg total) by mouth 2 (two) times daily.  . potassium chloride (K-DUR,KLOR-CON) 10 MEQ tablet Take 1 tablet (10 mEq total) by mouth 2 (two) times daily.   Allergies  Allergen Reactions  . Amlodipine Other (See Comments)    malaise malaise malaise  . Amoxicillin-Pot Clavulanate Other (See Comments)    Other reaction(s): Other (See Comments) Cannot remember. Cannot remember.  . Penicillins Other (See Comments)    Has patient had a PCN reaction causing immediate rash, facial/tongue/throat swelling, SOB or lightheadedness with hypotension: Unknown Has patient had a PCN reaction causing severe rash involving mucus membranes or skin necrosis: Unknown Has patient had a PCN reaction that required hospitalization: Unknown Has patient had a PCN reaction occurring within the last 10 years: No If all of the above answers are "NO",  then may proceed with Cephalosporin use. Cannot remember.  . Codeine Rash  . Codeine Sulfate Rash   Recent Results (from the past 2160 hour(s))  Basic metabolic panel     Status: Abnormal   Collection Time: 11/01/17 12:16 PM  Result Value Ref Range   Sodium 141 135 - 145 mmol/L   Potassium 3.6 3.5 - 5.1 mmol/L   Chloride 102 101 - 111 mmol/L   CO2 27 22 - 32 mmol/L   Glucose, Bld 107 (H) 65 - 99 mg/dL   BUN 19 6 - 20 mg/dL   Creatinine, Ser 0.75 0.44 - 1.00 mg/dL   Calcium 10.0 8.9 - 10.3 mg/dL  GFR calc non Af Amer >60 >60 mL/min   GFR calc Af Amer >60 >60 mL/min    Comment: (NOTE) The eGFR has been calculated using the CKD EPI equation. This calculation has not been validated in all clinical situations. eGFR's persistently <60 mL/min signify possible Chronic Kidney Disease.    Anion gap 12 5 - 15    Comment: Performed at [facility], [address] [place], [place] [address]  CBC     Status: None   Collection Time: 11/01/17 12:16 PM  Result Value Ref Range   WBC 5.4 3.6 - 11.0 K/uL   RBC 4.67 3.80 - 5.20 MIL/uL   Hemoglobin 13.5 12.0 - 16.0 g/dL   HCT 41.2 35.0 - 47.0 %   MCV 88.3 80.0 - 100.0 fL   MCH 29.0 26.0 - 34.0 pg   MCHC 32.8 32.0 - 36.0 g/dL   RDW 14.1 11.5 - 14.5 %   Platelets 210 150 - 440 K/uL    Comment: Performed at [facility], [address] [place], [place] [address]  Troponin I     Status: None   Collection Time: 11/01/17 12:16 PM  Result Value Ref Range   Troponin I <0.03 <0.03 ng/mL    Comment: Performed at [facility], [address], [place], [place] [address]  Brain natriuretic peptide     Status: Abnormal   Collection Time: 11/01/17 12:16 PM  Result Value Ref Range   B Natriuretic Peptide 366.0 (H) 0.0 - 100.0 pg/mL    Comment: Performed at [facility], [address] [place], [place] [address]  Magnesium     Status: None   Collection Time: 11/01/17 12:16 PM  Result Value Ref Range    Magnesium 1.8 1.7 - 2.4 mg/dL    Comment: Performed at [facility], [address] [place], [place] [address]  MRSA PCR Screening     Status: None   Collection Time: 11/01/17  8:13 PM  Result Value Ref Range   MRSA by PCR NEGATIVE NEGATIVE    Comment:        The GeneXpert MRSA Assay (FDA approved for NASAL specimens only), is one component of a comprehensive MRSA colonization surveillance program. It is not intended to diagnose MRSA infection nor to guide or monitor treatment for MRSA infections. Performed at [facility], [address] [place], [place] [address]   Glucose, capillary     Status: Abnormal   Collection Time: 11/01/17  8:20 PM  Result Value Ref Range   Glucose-Capillary 114 (H) 65 - 99 mg/dL  Troponin I     Status: Abnormal   Collection Time: 11/01/17  8:28 PM  Result Value Ref Range   Troponin I 0.03 (HH) <0.03 ng/mL    Comment: CRITICAL RESULT CALLED TO, READ BACK BY AND VERIFIED WITH JOSH WILLIAMSON AT 2114 11/01/2017 BY TFK. Performed at [facility], [address] [place],  [address]   CBC     Status: None   Collection Time: 11/02/17  3:31 AM  Result Value Ref Range   WBC 6.8 3.6 - 11.0 K/uL   RBC 4.48 3.80 - 5.20 MIL/uL   Hemoglobin 13.2 12.0 - 16.0 g/dL   HCT 39.5 35.0 - 47.0 %   MCV 88.2 80.0 - 100.0 fL   MCH 29.4 26.0 - 34.0 pg   MCHC 33.4 32.0 - 36.0 g/dL   RDW 14.1 11.5 - 14.5 %   Platelets 211 150 - 440  K/uL    Comment: Performed at [facility], [address] [place], [place] [address]  Urinalysis, Routine w reflex microscopic     Status: Abnormal   Collection Time: 11/02/17  4:08 AM  Result Value Ref Range   Color, Urine AMBER (A) YELLOW    Comment: BIOCHEMICALS MAY BE AFFECTED BY COLOR   APPearance HAZY (A) CLEAR   Specific Gravity, Urine 1.016 1.005 - 1.030   pH 6.0 5.0 - 8.0   Glucose, UA NEGATIVE NEGATIVE mg/dL   Hgb urine dipstick NEGATIVE NEGATIVE   Bilirubin Urine NEGATIVE  NEGATIVE   Ketones, ur NEGATIVE NEGATIVE mg/dL   Protein, ur 100 (A) NEGATIVE mg/dL   Nitrite NEGATIVE NEGATIVE   Leukocytes, UA NEGATIVE NEGATIVE   RBC / HPF 0-5 0 - 5 RBC/hpf   WBC, UA 0-5 0 - 5 WBC/hpf   Bacteria, UA NONE SEEN NONE SEEN   Squamous Epithelial / LPF 0-5 (A) NONE SEEN   Mucus PRESENT    Hyaline Casts, UA PRESENT     Comment: Performed at [facility], [address], [place], [place] [address]  Basic metabolic panel     Status: Abnormal   Collection Time: 11/02/17  5:23 AM  Result Value Ref Range   Sodium 139 135 - 145 mmol/L   Potassium 4.0 3.5 - 5.1 mmol/L   Chloride 105 101 - 111 mmol/L   CO2 25 22 - 32 mmol/L   Glucose, Bld 117 (H) 65 - 99 mg/dL   BUN 22 (H) 6 - 20 mg/dL   Creatinine, Ser 1.06 (H) 0.44 - 1.00 mg/dL   Calcium 9.1 8.9 - 10.3 mg/dL   GFR calc non Af Amer 48 (L) >60 mL/min   GFR calc Af Amer 56 (L) >60 mL/min    Comment: (NOTE) The eGFR has been calculated using the CKD EPI equation. This calculation has not been validated in all clinical situations. eGFR's persistently <60 mL/min signify possible Chronic Kidney Disease.    Anion gap 9 5 - 15    Comment: Performed at [facility], [address] [place], [place] [address]  Magnesium     Status: None   Collection Time: 11/02/17  5:23 AM  Result Value Ref Range   Magnesium 1.9 1.7 - 2.4 mg/dL    Comment: Performed at [facility], [address] [place], [place] [address]  Phosphorus     Status: None   Collection Time: 11/02/17  5:23 AM  Result Value Ref Range   Phosphorus 4.1 2.5 - 4.6 mg/dL    Comment: Performed at [facility], [address] [place], [place] [address]  Troponin I     Status: None   Collection Time: 11/02/17  5:23 AM  Result Value Ref Range   Troponin I <0.03 <0.03 ng/mL    Comment: Performed at [facility], [address] [place],  [address]  Basic metabolic panel     Status: Abnormal   Collection Time:  11/03/17  6:21 AM  Result Value Ref Range   Sodium 134 (L) 135 - 145 mmol/L   Potassium 3.7 3.5 - 5.1 mmol/L   Chloride 101 101 - 111 mmol/L   CO2 24 22 - 32 mmol/L   Glucose, Bld 102 (H) 65 - 99 mg/dL   BUN 22 (H) 6 - 20 mg/dL   Creatinine, Ser 1.15 (H) 0.44 - 1.00 mg/dL   Calcium 9.0 8.9 - 10.3 mg/dL   GFR calc non Af  Amer 43 (L) >60 mL/min   GFR calc Af Amer 50 (L) >60 mL/min    Comment: (NOTE) The eGFR has been calculated using the CKD EPI equation. This calculation has not been validated in all clinical situations. eGFR's persistently <60 mL/min signify possible Chronic Kidney Disease.    Anion gap 9 5 - 15    Comment: Performed at [facility], [address] [place], [place] [address]  CBC     Status: None   Collection Time: 11/03/17  6:21 AM  Result Value Ref Range   WBC 5.6 3.6 - 11.0 K/uL   RBC 4.54 3.80 - 5.20 MIL/uL   Hemoglobin 13.1 12.0 - 16.0 g/dL   HCT 40.1 35.0 - 47.0 %   MCV 88.4 80.0 - 100.0 fL   MCH 28.9 26.0 - 34.0 pg   MCHC 32.7 32.0 - 36.0 g/dL   RDW 14.0 11.5 - 14.5 %   Platelets 186 150 - 440 K/uL    Comment: Performed at [facility], [address] [place], [place] [address]  ECHOCARDIOGRAM COMPLETE     Status: None   Collection Time: 11/03/17  9:52 AM  Result Value Ref Range   Weight 2,704 oz   Height 59 in   BP 139/96 mmHg  Basic metabolic panel     Status: Abnormal   Collection Time: 11/04/17 10:53 AM  Result Value Ref Range   Sodium 138 135 - 145 mmol/L   Potassium 3.4 (L) 3.5 - 5.1 mmol/L   Chloride 98 (L) 101 - 111 mmol/L   CO2 30 22 - 32 mmol/L   Glucose, Bld 112 (H) 65 - 99 mg/dL   BUN 20 6 - 20 mg/dL   Creatinine, Ser 1.10 (H) 0.44 - 1.00 mg/dL   Calcium 9.0 8.9 - 10.3 mg/dL   GFR calc non Af Amer 46 (L) >60 mL/min   GFR calc Af Amer 53 (L) >60 mL/min    Comment: (NOTE) The eGFR has been calculated using the CKD EPI equation. This calculation has not been validated in all clinical situations. eGFR's  persistently <60 mL/min signify possible Chronic Kidney Disease.    Anion gap 10 5 - 15    Comment: Performed at [facility], [address] [place], [place] [address]  Comprehensive metabolic panel     Status: Abnormal   Collection Time: 11/08/17  5:37 PM  Result Value Ref Range   Sodium 140 135 - 145 mmol/L   Potassium 3.4 (L) 3.5 - 5.1 mmol/L   Chloride 101 101 - 111 mmol/L   CO2 29 22 - 32 mmol/L   Glucose, Bld 106 (H) 65 - 99 mg/dL   BUN 23 (H) 6 - 20 mg/dL   Creatinine, Ser 1.02 (H) 0.44 - 1.00 mg/dL   Calcium 8.8 (L) 8.9 - 10.3 mg/dL   Total Protein 6.2 (L) 6.5 - 8.1 g/dL   Albumin 3.5 3.5 - 5.0 g/dL   AST 29 15 - 41 U/L   ALT 25 14 - 54 U/L   Alkaline Phosphatase 45 38 - 126 U/L   Total Bilirubin 1.2 0.3 - 1.2 mg/dL   GFR calc non Af Amer 50 (L) >60 mL/min   GFR calc Af Amer 58 (L) >60 mL/min    Comment: (NOTE) The eGFR has been calculated using the CKD EPI equation. This calculation has not been validated in all clinical situations. eGFR's persistently <60 mL/min signify possible Chronic Kidney Disease.    Anion gap 10  5 - 15    Comment: Performed at [facility], [address] [place], [place] [address]  Brain natriuretic peptide     Status: Abnormal   Collection Time: 11/08/17  5:37 PM  Result Value Ref Range   B Natriuretic Peptide 680.0 (H) 0.0 - 100.0 pg/mL    Comment: Performed at [facility], [address] [place], [place] [address]  Troponin I     Status: None   Collection Time: 11/08/17  5:37 PM  Result Value Ref Range   Troponin I <0.03 <0.03 ng/mL    Comment: Performed at [facility], [address] [place], [place] [address]  CBC with Differential     Status: None   Collection Time: 11/08/17  5:37 PM  Result Value Ref Range   WBC 5.4 3.6 - 11.0 K/uL   RBC 4.47 3.80 - 5.20 MIL/uL   Hemoglobin 13.2 12.0 - 16.0 g/dL   HCT 39.1 35.0 - 47.0 %   MCV 87.4 80.0 - 100.0 fL   MCH 29.5 26.0 - 34.0 pg   MCHC  33.7 32.0 - 36.0 g/dL   RDW 14.4 11.5 - 14.5 %   Platelets 218 150 - 440 K/uL   Neutrophils Relative % 55 %   Neutro Abs 3.0 1.4 - 6.5 K/uL   Lymphocytes Relative 33 %   Lymphs Abs 1.8 1.0 - 3.6 K/uL   Monocytes Relative 8 %   Monocytes Absolute 0.4 0.2 - 0.9 K/uL   Eosinophils Relative 3 %   Eosinophils Absolute 0.2 0 - 0.7 K/uL   Basophils Relative 1 %   Basophils Absolute 0.1 0 - 0.1 K/uL    Comment: Performed at [facility], [address] [place], [place] [address]  Urinalysis, Complete w Microscopic     Status: Abnormal   Collection Time: 11/08/17  5:37 PM  Result Value Ref Range   Color, Urine YELLOW (A) YELLOW   APPearance HAZY (A) CLEAR   Specific Gravity, Urine 1.009 1.005 - 1.030   pH 7.0 5.0 - 8.0   Glucose, UA NEGATIVE NEGATIVE mg/dL   Hgb urine dipstick SMALL (A) NEGATIVE   Bilirubin Urine NEGATIVE NEGATIVE   Ketones, ur NEGATIVE NEGATIVE mg/dL   Protein, ur NEGATIVE NEGATIVE mg/dL   Nitrite NEGATIVE NEGATIVE   Leukocytes, UA MODERATE (A) NEGATIVE   RBC / HPF 0-5 0 - 5 RBC/hpf   WBC, UA 6-30 0 - 5 WBC/hpf   Bacteria, UA RARE (A) NONE SEEN   Squamous Epithelial / LPF 0-5 (A) NONE SEEN   Non Squamous Epithelial 0-5 (A) NONE SEEN    Comment: Performed at [facility], [address] [place], [place] [address]  Lactic acid, plasma     Status: None   Collection Time: 11/08/17  7:04 PM  Result Value Ref Range   Lactic Acid, Venous 1.3 0.5 - 1.9 mmol/L    Comment: Performed at [facility], [address] [place], [place] [address]  B Nat Peptide     Status: Abnormal   Collection Time: 11/14/17  6:05 PM  Result Value Ref Range   B Natriuretic Peptide 648.0 (H) 0.0 - 100.0 pg/mL    Comment: Performed at [facility], [address] [place], [place] [address]  CBC     Status: None   Collection Time: 11/14/17  6:05 PM  Result Value Ref Range   WBC 6.0 3.6 - 11.0 K/uL   RBC 4.57 3.80 - 5.20 MIL/uL  Hemoglobin 13.2 12.0  - 16.0 g/dL   HCT 40.0 35.0 - 47.0 %   MCV 87.6 80.0 - 100.0 fL   MCH 29.0 26.0 - 34.0 pg   MCHC 33.1 32.0 - 36.0 g/dL   RDW 14.5 11.5 - 14.5 %   Platelets 201 150 - 440 K/uL    Comment: Performed at [facility], [address] [place], [place] [address]  Basic metabolic panel     Status: Abnormal   Collection Time: 11/14/17  6:05 PM  Result Value Ref Range   Sodium 140 135 - 145 mmol/L   Potassium 3.6 3.5 - 5.1 mmol/L   Chloride 101 101 - 111 mmol/L   CO2 31 22 - 32 mmol/L   Glucose, Bld 102 (H) 65 - 99 mg/dL   BUN 22 (H) 6 - 20 mg/dL   Creatinine, Ser 0.96 0.44 - 1.00 mg/dL   Calcium 9.2 8.9 - 10.3 mg/dL   GFR calc non Af Amer 54 (L) >60 mL/min   GFR calc Af Amer >60 >60 mL/min    Comment: (NOTE) The eGFR has been calculated using the CKD EPI equation. This calculation has not been validated in all clinical situations. eGFR's persistently <60 mL/min signify possible Chronic Kidney Disease.    Anion gap 8 5 - 15    Comment: Performed at [facility], [address] [place], [place] [address]  Digoxin level     Status: Abnormal   Collection Time: 11/14/17  6:05 PM  Result Value Ref Range   Digoxin Level <0.2 (L) 0.8 - 2.0 ng/mL    Comment: RESULT CONFIRMED BY MANUAL DILUTION BY CAF Performed at [facility], [address] [place], [place] [address]    Objective  Body mass index is 33.73 kg/m. Wt Readings from Last 3 Encounters:  11/11/17 167 lb (75.8 kg)  11/09/17 168 lb 8 oz (76.4 kg)  11/08/17 170 lb (77.1 kg)   Temp Readings from Last 3 Encounters:  11/11/17 (!) 97.5 F (36.4 C) (Oral)  11/08/17 97.7 F (36.5 C) (Oral)  11/04/17 98.2 F (36.8 C) (Oral)   BP Readings from Last 3 Encounters:  11/11/17 118/82  11/09/17 132/80  11/08/17 (!) 144/95   Pulse Readings from Last 3 Encounters:  11/11/17 100  11/09/17 (!) 103  11/08/17 (!) 46   O2 sat room air 92%   Physical Exam  Constitutional: She is oriented to person,  place, and time and well-developed, well-nourished, and in no distress. Vital signs are normal.  HENT:  Head: Normocephalic and atraumatic.  Mouth/Throat: Oropharynx is clear and moist and mucous membranes are normal.  Eyes: Conjunctivae are normal. Pupils are equal, round, and reactive to light.  Cardiovascular: Normal rate. An irregularly irregular rhythm present.  Left > right leg edema 1 to 2+ chronic   Pulmonary/Chest: She has decreased breath sounds in the left upper field, the left middle field and the left lower field.  Decreased breath sounds right   Neurological: She is alert and oriented to person, place, and time. Gait normal. Gait normal.  Skin: Skin is warm, dry and intact.  Psychiatric: Mood, memory, affect and judgment normal.  Nursing note and vitals reviewed.   Assessment   1. Dizziness ddx of etiology with + orthostatics, also with h/o Afib with RVR rate controlled today, and lack of appetite. CT head 11/08/17 atrophy with small vessel disease stable, foci arterial vascular calcification, ethmoid sinus disease b/l.  2. Left pleural effusion ? Etiology  systolic CHF vs other  3. Systolic CHF 53-96%/DSWVTVN cardiomyopathy noted on echo 11/03/17 with elevated PAP 55 mmHG, diffuse HK and valvular abnormalities 4. Dementia  5. Afib with RVR rate controlled today  6.HM Plan  1.  Orthostatics today get up slowly  F/u with cardiology next week pending labs and repeat CXR cards considering cardioversion in future for Afib with RVR  Encouraged po intake 2. Order CT chest to further w/u and referral to pulmonary  3. Reviewed echo with daughter f/u cardiology and pulmonary for elevated PAP Lasix 20 bid taking with Kdur 10 mg bid  4.  Advised daughter to get neurology to eval. (Dr. Melrose Nakayama) For decision making capacity [facility]). 08/2017 he previously Rx Aricept 5 mg qhs but this is not on medication list, also Melatonin 5 mg qhs, gabapentin 100 mg qd then bid and rec f/u in 6 months.   Consider sooner neurology appt for [facility]  Filled out paperwork for [facility] participation in activities though I think pt should be cleared by cardiology and feeling much better as far as #1 before does [place] activities.   5. Cont Eliquis 5 mg bid, Metoprolol for rate control 50 mg bid, on ACEI F/u with cardiology next week 6. Had flu, prevnar, Tdap, zostavax  Consider update pna 23 and shingrix     Provider: Dr. Olivia Mackie McLean-Scocuzza-Internal Medicine

## 2017-11-15 NOTE — Patient Instructions (Signed)
Medication Instructions:  Your physician recommends that you continue on your current medications as directed. Please refer to the Current Medication list given to you today.   Labwork: none  Testing/Procedures: none  Follow-Up: You have been referred to Pulmonology within the next 2 days.  Your physician recommends that you schedule a follow-up appointment in: 1 MONTH WITH DR ARIDA.     If you need a refill on your cardiac medications before your next appointment, please call your pharmacy.

## 2017-11-15 NOTE — Telephone Encounter (Addendum)
I have printed DME for walker. In quick sign.

## 2017-11-15 NOTE — Progress Notes (Signed)
Cardiology Office Note   Date:  11/15/2017   ID:  Cheryl Winters, DOB 26-Jul-1934, MRN [id number]  PCP:  Dale Lincoln, MD  Cardiologist:   Lorine Bears, MD   Chief Complaint  Patient presents with  . OTHER    Chest tightness, dizziness, blue fingertips and sob. Pt has been worse since last hospital visit. Meds reviewed verbally with pt.      History of Present Illness: Cheryl Winters is a 84 y.o. female who presents for a follow-up regarding persistent atrial fibrillation chronic systolic heart failure, left pleural effusion and history of bradycardia.   She has chronic medical conditions that include hypertension, dementia, hyperlipidemia and frequent UTI. She had previous bradycardia in 2016 that improved after stopping propranolol. She was hospitalized on January 15 with atrial fibrillation with rapid ventricular response which was a new diagnosis.  She was started on IV diltiazem and oral metoprolol.  Home blood pressure medications were continued and as a result she developed hypotension that required transfer to the ICU.  Echocardiogram showed an EF of 30-35% with diffuse hypokinesis, moderate to severe mitral regurgitation, severely dilated left atrium, moderately dilated right ventricle with mildly reduced systolic function and moderate pulmonary hypertension with peak systolic pressure of 55 mmHg. She has not been doing very well since hospitalization with continued fatigue, dizziness and low energy.  This is reported mostly by her daughter who is with her today.  She lives about 20 minutes away but has been assisting her mother with medications especially with underlying dementia.  Her appetite has been poor.  The dose of furosemide was increased recently to twice daily.  Past Medical History:  Diagnosis Date  . Abdominal pain, right upper quadrant   . Benign essential tremor   . Carpal tunnel syndrome   . Chronic combined systolic and diastolic CHF (congestive heart  failure) (HCC)    a. TTE 10/2017: EF 30-35%, diffuse HK, not technically sufficient to allow for LV diastolic function, moderate to severe MR, severely dilated left atrium measuring 54 mm, moderately dilated RV with mild systolic reduction, mildly dilated right atrium, moderate TR, PASP 55 mmHg, moderate left-sided pleural effusion  . Dementia    a. mixed vascular and Alzheimer's  . DJD (degenerative joint disease) of knee   . GERD (gastroesophageal reflux disease)   . History of stress test    a. 10/2015 showed no evidence of ischemia, EF 62%, low risk study  . HTN (hypertension)   . Hyperlipidemia   . Persistent atrial fibrillation (HCC)    a. diagnosed 10/2017; b. CHADS2VASc => 6 (CHF, HTN, age x 2, vascular disease, female); c. on Eliquis  . Syncope and collapse   . Systolic CHF (HCC)   . Ulnar nerve lesion   . UTI (urinary tract infection)   . Vitamin D deficiency     Past Surgical History:  Procedure Laterality Date  . Bilateral Bletharoplastices    . BREAST BIOPSY  1957 and 1962   x 2, Nml per pt  . BREAST CYST EXCISION     x 2 - nml path per pt  . CARPAL TUNNEL RELEASE  2004  . CATARACT EXTRACTION    . TOTAL KNEE ARTHROPLASTY     bilateral, total, Marcy Panning  . TOTAL SHOULDER REPLACEMENT  08/2010   left  . TUBAL LIGATION    . UMBILICAL HERNIA REPAIR    . VAGINAL DELIVERY     1 set Twins/2 single births  Current Outpatient Medications  Medication Sig Dispense Refill  . alum & mag hydroxide-simeth (MAALOX/MYLANTA) 200-200-20 MG/5ML suspension Take 30 mLs by mouth every 6 (six) hours as needed for indigestion or heartburn. 355 mL 0  . apixaban (ELIQUIS) 5 MG TABS tablet Take 1 tablet (5 mg total) by mouth 2 (two) times daily. 60 tablet 0  . furosemide (LASIX) 20 MG tablet Take 1 tablet (20 mg total) by mouth 2 (two) times daily. 60 tablet 0  . lisinopril (PRINIVIL,ZESTRIL) 2.5 MG tablet Take 1 tablet (2.5 mg total) by mouth daily. 90 tablet 3  . metoprolol  tartrate (LOPRESSOR) 50 MG tablet Take 1 tablet (50 mg total) by mouth 2 (two) times daily. 60 tablet 3  . potassium chloride (K-DUR,KLOR-CON) 10 MEQ tablet Take 1 tablet (10 mEq total) by mouth 2 (two) times daily. 60 tablet 0   No current facility-administered medications for this visit.     Allergies:   Amlodipine; Amoxicillin-pot clavulanate; Penicillins; Codeine; and Codeine sulfate    Social History:  The patient  reports that  has never smoked. she has never used smokeless tobacco. She reports that she does not drink alcohol or use drugs.   Family History:  The patient's family history includes Alcohol abuse in her other; Arthritis in her mother; Brain cancer (age of onset: 7242) in her father; Breast cancer in her maternal aunt; Dementia in her mother; Early death (age of onset: 8542) in her father; Hypertension in her mother; Stroke in her mother.    ROS:  Please see the history of present illness.   Otherwise, review of systems are positive for none.   All other systems are reviewed and negative.    PHYSICAL EXAM: VS:  BP 120/80 (BP Location: Left Arm, Patient Position: Sitting, Cuff Size: Normal)   Pulse (!) 106   Ht 4\' 11"  (1.499 m)   Wt 166 lb 4 oz (75.4 kg)   BMI 33.58 kg/m  , BMI Body mass index is 33.58 kg/m. GEN: Well nourished, well developed, in no acute distress  HEENT: normal  Neck: no JVD, carotid bruits, or masses Cardiac: Irregularly irregular and mildly tachycardic; no murmurs, rubs, or gallops,no edema  Respiratory: Diminished breath sound at the left base with few crackles at the right base, normal work of breathing GI: soft, nontender, nondistended, + BS MS: no deformity or atrophy  Skin: warm and dry, no rash Neuro:  Strength and sensation are intact Psych: euthymic mood, full affect   EKG:  EKG is ordered today. EKG showed atrial fibrillation with RVR and low voltage.  Recent Labs: 07/15/2017: TSH 2.78 11/02/2017: Magnesium 1.9 11/08/2017: ALT  25 11/14/2017: B Natriuretic Peptide 648.0; BUN 22; Creatinine, Ser 0.96; Hemoglobin 13.2; Platelets 201; Potassium 3.6; Sodium 140    Lipid Panel    Component Value Date/Time   CHOL 245 (H) 07/15/2017 0817   TRIG 59.0 07/15/2017 0817   HDL 64.10 07/15/2017 0817   CHOLHDL 4 07/15/2017 0817   VLDL 11.8 07/15/2017 0817   LDLCALC 169 (H) 07/15/2017 0817   LDLDIRECT 123.9 08/27/2013 0814      Wt Readings from Last 3 Encounters:  11/15/17 166 lb 4 oz (75.4 kg)  11/11/17 167 lb (75.8 kg)  11/09/17 168 lb 8 oz (76.4 kg)        ASSESSMENT AND PLAN:  1.  Persistent atrial fibrillation: She continues to be mildly tachycardic and appears to be symptomatic from this.  Given significantly dilated left atrium and reduced ejection fraction,  I think it is going to be difficult to get her back into sinus rhythm without an antiarrhythmic medication.  The best option is probably amiodarone which can be started before planned cardioversion.  For now, continue treatment with metoprolol and anticoagulation with Eliquis.  2.  Persistent left pleural effusion: I reviewed her chest x-ray from yesterday which continues to show persistent left pleural effusion: The exact etiology of this is not entirely clear.  Although it could be due to heart failure, the location of the left side is somewhat unusual.  I do think she requires thoracentesis for diagnostic and therapeutic reasons.  I am referring her to pulmonary to manage this.  This should be addressed as soon as possible as anticoagulation might need to be interrupted for thoracentesis.  I want her to be on anticoagulation without interruption so that cardioversion can be performed in the near future.  The sooner we address the left pleural effusion the better.  3.  Chronic systolic heart failure: Reduced EF is likely due to A. fib with RVR.  However, underlying ischemia cannot be excluded.  Given her age and dementia, I do not think she is going to be a good  candidate for cardiac catheterization.  Recommend continuing medical therapy.    Disposition:   Referred to pulmonary for evaluation of persistent left pleural effusion.  Follow-up with me in 1 month.  Signed,  Lorine Bears, MD  11/15/2017 4:45 PM    [facility]

## 2017-11-16 NOTE — Telephone Encounter (Signed)
rx signed and placed in your chair.

## 2017-11-16 NOTE — Telephone Encounter (Signed)
Patient scheduled  FYI

## 2017-11-16 NOTE — Telephone Encounter (Signed)
Corrected order

## 2017-11-16 NOTE — Telephone Encounter (Signed)
I can see her Monday at 12:00.  Let me know if this is a problem.

## 2017-11-16 NOTE — Telephone Encounter (Signed)
Patient daughter stated that she is concerned because her mother has changed at times she is fine but other times she is confused and is not eating regularly, Example ( she ask her daughter if the toilet was in the bathroom) patient daughter says at times she is normal other times she seems really different.  Patient daughter ask if PCP could work her maybe this week or next that she is really concerned.

## 2017-11-17 ENCOUNTER — Ambulatory Visit (INDEPENDENT_AMBULATORY_CARE_PROVIDER_SITE_OTHER): Payer: Medicare Other | Admitting: Internal Medicine

## 2017-11-17 ENCOUNTER — Other Ambulatory Visit: Payer: Self-pay | Admitting: *Deleted

## 2017-11-17 ENCOUNTER — Telehealth: Payer: Self-pay | Admitting: Cardiovascular Disease

## 2017-11-17 ENCOUNTER — Ambulatory Visit
Admission: RE | Admit: 2017-11-17 | Discharge: 2017-11-17 | Disposition: A | Discharge status: Home / Self Care | Payer: Medicare Other | Source: Ambulatory Visit | Attending: Internal Medicine | Admitting: Internal Medicine

## 2017-11-17 ENCOUNTER — Ambulatory Visit
Admission: RE | Admit: 2017-11-17 | Discharge: 2017-11-17 | Disposition: A | Discharge status: Home / Self Care | Payer: Medicare Other | Source: Ambulatory Visit | Attending: Interventional Radiology | Admitting: Interventional Radiology

## 2017-11-17 ENCOUNTER — Encounter: Payer: Self-pay | Admitting: Internal Medicine

## 2017-11-17 VITALS — BP 120/66 | HR 85 | Ht 59.0 in | Wt 163.0 lb

## 2017-11-17 DIAGNOSIS — J90 Pleural effusion, not elsewhere classified: Secondary | ICD-10-CM

## 2017-11-17 LAB — BODY FLUID CELL COUNT WITH DIFFERENTIAL
EOS FL: 0 %
LYMPHS FL: 82 %
Monocyte-Macrophage-Serous Fluid: 8 %
NEUTROPHIL FLUID: 10 %
Total Nucleated Cell Count, Fluid: 922 cu mm

## 2017-11-17 LAB — PROTEIN, PLEURAL OR PERITONEAL FLUID: Total protein, fluid: <3 g/dL

## 2017-11-17 LAB — GLUCOSE, PLEURAL OR PERITONEAL FLUID: GLUCOSE FL: 122 mg/dL

## 2017-11-17 LAB — LACTATE DEHYDROGENASE, PLEURAL OR PERITONEAL FLUID: LD, Fluid: 66 U/L — ABNORMAL HIGH (ref 3–23)

## 2017-11-17 MED ORDER — APIXABAN 5 MG PO TABS: 5.0000 mg | ORAL_TABLET | Freq: Two times a day (BID) | ORAL | 3 refills | Status: DC

## 2017-11-17 MED ORDER — FUROSEMIDE 20 MG PO TABS: 20.0000 mg | ORAL_TABLET | Freq: Two times a day (BID) | ORAL | 3 refills | Status: DC

## 2017-11-17 MED ORDER — POTASSIUM CHLORIDE CRYS ER 10 MEQ PO TBCR: 10.0000 meq | EXTENDED_RELEASE_TABLET | Freq: Two times a day (BID) | ORAL | 3 refills | Status: AC

## 2017-11-17 NOTE — Telephone Encounter (Addendum)
Cheryl Winters,   Dr. Kirke CorinArida saw this patient on Tuesday- do you mind calling her daughter, Cheryl Winters (ok per DPR)?

## 2017-11-17 NOTE — Patient Instructions (Signed)
PLAN FOR US GUIDED THORACENTESIS TODAY

## 2017-11-17 NOTE — Procedures (Signed)
  Procedure: US Left THoracentesis  EBL:   minimal Complications:  none immediate CXR to follow.  [facility].  Thora Lance. Shatora Weatherbee MD Main # [phone] Pager  [phone]

## 2017-11-17 NOTE — Telephone Encounter (Signed)
°*  STAT* If patient is at the pharmacy, call can be transferred to refill team.   1. Which medications need to be refilled? (please list name of each medication and dose if known)     Furosemide 20 po BID    Eliquis 5 mg po BID    Kdur 10 meq po BID      2. Which pharmacy/location (including street and city if local pharmacy) is medication to be sent to?     [facility] [address] [place]  3. Do they need a 30 day or 90 day supply? 90

## 2017-11-17 NOTE — Telephone Encounter (Signed)
Patient daughter Cheryl Winters calling in regards to last appt with Dr Kirke CorinArida Has questions and would like clarification based on what was mentioned during visit  Please call

## 2017-11-17 NOTE — Telephone Encounter (Signed)
Requested Prescriptions   Signed Prescriptions Disp Refills  . apixaban (ELIQUIS) 5 MG TABS tablet 180 tablet 3    Sig: Take 1 tablet (5 mg total) by mouth 2 (two) times daily.    Authorizing Provider: Lorine BearsARIDA, MUHAMMAD A    Ordering User: Shawnie DapperLOPEZ, Ellionna Buckbee C  . furosemide (LASIX) 20 MG tablet 180 tablet 3    Sig: Take 1 tablet (20 mg total) by mouth 2 (two) times daily.    Authorizing Provider: Lorine BearsARIDA, MUHAMMAD A    Ordering User: Shawnie DapperLOPEZ, Shaneece Stockburger C  . potassium chloride (K-DUR,KLOR-CON) 10 MEQ tablet 180 tablet 3    Sig: Take 1 tablet (10 mEq total) by mouth 2 (two) times daily.    Authorizing Provider: Lorine BearsARIDA, MUHAMMAD A    Ordering User: Kendrick FriesLOPEZ, Samhitha Rosen C

## 2017-11-17 NOTE — Telephone Encounter (Signed)
Requested Prescriptions   Signed Prescriptions Disp Refills  . apixaban (ELIQUIS) 5 MG TABS tablet 180 tablet 3    Sig: Take 1 tablet (5 mg total) by mouth 2 (two) times daily.    Authorizing Provider: ARIDA, MUHAMMAD A    Ordering User: Harlem Bula C  . furosemide (LASIX) 20 MG tablet 180 tablet 3    Sig: Take 1 tablet (20 mg total) by mouth 2 (two) times daily.    Authorizing Provider: ARIDA, MUHAMMAD A    Ordering User: Dorisann Schwanke C  . potassium chloride (K-DUR,KLOR-CON) 10 MEQ tablet 180 tablet 3    Sig: Take 1 tablet (10 mEq total) by mouth 2 (two) times daily.    Authorizing Provider: ARIDA, MUHAMMAD A    Ordering User: Perkins Molina C    

## 2017-11-17 NOTE — Progress Notes (Signed)
Name: Cheryl Winters MRN: [id number] DOB: [birth year]     CONSULTATION DATE: 1.31.19 REFERRING MD : ARIDA  CHIEF COMPLAINT: SOB  STUDIES:  CXR 1.28.19 I have Independently reviewed images of  CXR   on 11/17/2017 Interpretation: LEFT SIDED OPACITY, probable effusion Present since 1.15.19  ECHO 1.17.19 Systolic function was   moderately to severely reduced. The estimated ejection fraction   was in the range of 30% to 35%. Diffuse hypokinesis. Regional   wall motion abnormalities  HISTORY OF PRESENT ILLNESS:  84 yo pleasantly demented white female seen today for abnormal CXR with LefT sided opacity most likley from pleural effusion  Patient presented to the hospital approximately 3 weeks ago for acute shortness of breath found to have a new diagnosis of atrial fibrillation with RVR subsequent chest x-ray showed a left-sided opacity with pleural effusion Patient had an echocardiogram done at the same time showed an EF of 30-35% with reduced and severe hypokinesis  Patient presented with shortness of breath and has been increasing over the last several weeks No chest pain no fevers no chills There is no active signs of infection at this time  Patient is on oral anticoagulation therapy for her atrial fibrillation Patient has dyspnea on exertion as well  Patient sitting in chair resting comfortably Most of the history is provided by the daughter  I have reviewed the chest x-ray findings with the patient and family The plan is to obtain an ultrasound-guided thoracentesis for therapeutic intervention       PAST MEDICAL HISTORY :   has a past medical history of Abdominal pain, right upper quadrant, Benign essential tremor, Carpal tunnel syndrome, Chronic combined systolic and diastolic CHF (congestive heart failure) (HCC), Dementia, DJD (degenerative joint disease) of knee, GERD (gastroesophageal reflux disease), History of stress test, HTN (hypertension), Hyperlipidemia,  Persistent atrial fibrillation (HCC), Syncope and collapse, Systolic CHF (HCC), Ulnar nerve lesion, UTI (urinary tract infection), and Vitamin D deficiency.  has a past surgical history that includes Vaginal delivery; Breast cyst excision; Umbilical hernia repair; Total knee arthroplasty; Total shoulder replacement (08/2010); Carpal tunnel release (2004); Tubal ligation; Cataract extraction; Bilateral Bletharoplastices; and Breast biopsy (1957 and 1962). Prior to Admission medications   Medication Sig Start Date End Date Taking? Authorizing Provider  alum & mag hydroxide-simeth (MAALOX/MYLANTA) 200-200-20 MG/5ML suspension Take 30 mLs by mouth every 6 (six) hours as needed for indigestion or heartburn. 11/04/17   Shaune Pollack, MD  apixaban (ELIQUIS) 5 MG TABS tablet Take 1 tablet (5 mg total) by mouth 2 (two) times daily. 11/04/17   Shaune Pollack, MD  furosemide (LASIX) 20 MG tablet Take 1 tablet (20 mg total) by mouth 2 (two) times daily. 11/09/17   Dunn, Raymon Mutton, PA-C  lisinopril (PRINIVIL,ZESTRIL) 2.5 MG tablet Take 1 tablet (2.5 mg total) by mouth daily. 11/09/17 02/07/18  Sondra Barges, PA-C  metoprolol tartrate (LOPRESSOR) 50 MG tablet Take 1 tablet (50 mg total) by mouth 2 (two) times daily. 11/09/17   Dunn, Raymon Mutton, PA-C  potassium chloride (K-DUR,KLOR-CON) 10 MEQ tablet Take 1 tablet (10 mEq total) by mouth 2 (two) times daily. 11/09/17   Sondra Barges, PA-C   Allergies  Allergen Reactions  . Amlodipine Other (See Comments)    malaise malaise malaise  . Amoxicillin-Pot Clavulanate Other (See Comments)    Other reaction(s): Other (See Comments) Cannot remember. Cannot remember.  . Penicillins Other (See Comments)    Has patient had a PCN reaction causing immediate rash, facial/tongue/throat swelling,  SOB or lightheadedness with hypotension: Unknown Has patient had a PCN reaction causing severe rash involving mucus membranes or skin necrosis: Unknown Has patient had a PCN reaction that required  hospitalization: Unknown Has patient had a PCN reaction occurring within the last 10 years: No If all of the above answers are "NO", then may proceed with Cephalosporin use. Cannot remember.  . Codeine Rash  . Codeine Sulfate Rash    FAMILY HISTORY:  family history includes Alcohol abuse in her other; Arthritis in her mother; Brain cancer (age of onset: 38) in her father; Breast cancer in her maternal aunt; Dementia in her mother; Early death (age of onset: 72) in her father; Hypertension in her mother; Stroke in her mother. SOCIAL HISTORY:  reports that  has never smoked. she has never used smokeless tobacco. She reports that she does not drink alcohol or use drugs.  REVIEW OF SYSTEMS:   Constitutional: Negative for fever, chills, weight loss, malaise/fatigue and diaphoresis.  HENT: Negative for hearing loss, ear pain, nosebleeds, congestion, sore throat, neck pain, tinnitus and ear discharge.   Eyes: Negative for blurred vision, double vision, photophobia, pain, discharge and redness.  Respiratory: Negative for cough, hemoptysis, sputum production, +shortness of breath, -wheezing and -stridor.   Cardiovascular: Negative for chest pain, palpitations, orthopnea, claudication, leg swelling and PND.  Gastrointestinal: Negative for heartburn, nausea, vomiting, abdominal pain, diarrhea, constipation, blood in stool and melena.  Genitourinary: Negative for dysuria, urgency, frequency, hematuria and flank pain.  Musculoskeletal: Negative for myalgias, back pain, joint pain and falls.  Skin: Negative for itching and rash.  Neurological: Negative for dizziness, tingling, tremors, sensory change, speech change, focal weakness, seizures, loss of consciousness, weakness and headaches.  Endo/Heme/Allergies: Negative for environmental allergies and polydipsia. Does not bruise/bleed easily.  ALL OTHER ROS ARE NEGATIVE    VITAL SIGNS: BP 120/66 (BP Location: Right Arm, Cuff Size: Normal)   Pulse 85    Ht 4\' 11"  (1.499 m)   Wt 163 lb (73.9 kg)   SpO2 90%   BMI 32.92 kg/m    Physical Examination:   GENERAL:NAD, no fevers, chills, no weakness no fatigue HEAD: Normocephalic, atraumatic.  EYES: Pupils equal, round, reactive to light. Extraocular muscles intact. No scleral icterus.  MOUTH: Moist mucosal membrane.   EAR, NOSE, THROAT: Clear without exudates. No external lesions.  NECK: Supple. No thyromegaly. No nodules. No JVD.  PULMONARY:CTA B/L no wheezes, no crackles, + rhonchi decreased BS at left side CARDIOVASCULAR: S1 and S2. IRRegular rate and rhythm. No murmurs, rubs, or gallops. No edema.  GASTROINTESTINAL: Soft, nontender, nondistended. No masses. Positive bowel sounds.  MUSCULOSKELETAL: No swelling, clubbing, or edema. Range of motion full in all extremities.  NEUROLOGIC: Cranial nerves II through XII are intact. No gross focal neurological deficits.  SKIN: No ulceration, lesions, rashes, or cyanosis. Skin warm and dry. Turgor intact.  PSYCHIATRIC: Mood, affect within normal limits. The patient is awake, alert and oriented x 3. Insight, judgment intact.      Recent Labs  Lab 11/14/17 1805  NA 140  K 3.6  CL 101  CO2 31  BUN 22*  CREATININE 0.96  GLUCOSE 102*   Recent Labs  Lab 11/14/17 1805  HGB 13.2  HCT 40.0  WBC 6.0  PLT 201   No results found.  ASSESSMENT / PLAN: 84 year old pleasantly demented white female with a new and recent diagnosis of atrial fibrillation in the setting of systolic congestive heart failure with an ejection fraction of 30-35% complicated by  left-sided opacification most likely related to pleural effusion  At this time the pleural effusion is most likely caused from her CHF Recommend ultrasound-guided thoracentesis at this time for therapeutic and further intervention will also obtain some lab work  The risks and benefits were explained to the patient and family All questions answered Follow up cardiology     Patient/Family  are satisfied with Plan of action and management. All questions answered Follow up as needed and in 3 months   Machaela Caterino Santiago Gladavid Julieth Tugman, M.D.  [facility]  Medical Director [facility] Medical Director [facility] Cardio-P[facility] Department

## 2017-11-18 ENCOUNTER — Telehealth: Payer: Self-pay | Admitting: Internal Medicine

## 2017-11-18 ENCOUNTER — Other Ambulatory Visit: Payer: Self-pay

## 2017-11-18 LAB — PROTEIN, BODY FLUID (OTHER): Total Protein, Body Fluid Other: 2.2 g/dL

## 2017-11-18 LAB — PH, BODY FLUID: pH, Body Fluid: 7.7

## 2017-11-18 NOTE — Telephone Encounter (Signed)
I spoke with patient's daughter, Rory PercyJayme who has questions regarding POC post thoracentesis. Reviewed Dr. Jari SportsmanArida's recommendations from Jan 29 OV. Daughter verbalized understanding and will have patient follow up with Eula Listenyan Dunn, PA-C as scheduled.

## 2017-11-18 NOTE — Telephone Encounter (Signed)
Returned call and notified per Dr.Ram cough normal for about 24 hrs. Patient may use cough syrup or drops. Nothing further needed.

## 2017-11-18 NOTE — Telephone Encounter (Signed)
Pt daughter states that since pt procedure yesterday, pt has been coughing, asks if this is normal and can she take a cough drop. Please call to discuss.

## 2017-11-20 LAB — BODY FLUID CULTURE: Culture: NO GROWTH

## 2017-11-21 ENCOUNTER — Ambulatory Visit: Payer: Medicare Other | Admitting: Internal Medicine

## 2017-11-21 LAB — CYTOLOGY - NON PAP

## 2017-11-21 MED ORDER — APIXABAN 5 MG PO TABS: 5.0000 mg | ORAL_TABLET | Freq: Two times a day (BID) | ORAL | 3 refills | Status: DC

## 2017-11-21 NOTE — Telephone Encounter (Signed)
Refill Request.  

## 2017-11-22 ENCOUNTER — Telehealth: Payer: Self-pay | Admitting: *Deleted

## 2017-11-22 ENCOUNTER — Telehealth: Payer: Self-pay | Admitting: Cardiovascular Disease

## 2017-11-22 ENCOUNTER — Ambulatory Visit: Payer: Medicare Other | Attending: Internal Medicine

## 2017-11-22 ENCOUNTER — Other Ambulatory Visit: Payer: Self-pay | Admitting: *Deleted

## 2017-11-22 ENCOUNTER — Telehealth: Payer: Self-pay | Admitting: Internal Medicine

## 2017-11-22 DIAGNOSIS — R05 Cough: Secondary | ICD-10-CM

## 2017-11-22 DIAGNOSIS — R059 Cough, unspecified: Secondary | ICD-10-CM

## 2017-11-22 LAB — CHOLESTEROL, BODY FLUID: Cholesterol, Fluid: 32 mg/dL

## 2017-11-22 MED ORDER — APIXABAN 5 MG PO TABS: 5.0000 mg | ORAL_TABLET | Freq: Two times a day (BID) | ORAL | 3 refills | Status: DC

## 2017-11-22 NOTE — Telephone Encounter (Signed)
Please see note below. 

## 2017-11-22 NOTE — Telephone Encounter (Signed)
Jayme notified. cxr ordered. appt scheduled for 11/23/17 @9 .

## 2017-11-22 NOTE — Telephone Encounter (Signed)
Pt daughter calling stating pt is needing a refill on Eliquis   [facility] [address]  They told them that they were unable to refill it  For we need to contact insurance company for this to be approved   Please advise

## 2017-11-22 NOTE — Telephone Encounter (Signed)
Requested Prescriptions   Signed Prescriptions Disp Refills  . apixaban (ELIQUIS) 5 MG TABS tablet 180 tablet 3    Sig: Take 1 tablet (5 mg total) by mouth 2 (two) times daily.    Authorizing Provider: ARIDA, MUHAMMAD A    Ordering User: Mckale Haffey C    

## 2017-11-22 NOTE — Telephone Encounter (Signed)
Requested Prescriptions   Signed Prescriptions Disp Refills  . apixaban (ELIQUIS) 5 MG TABS tablet 180 tablet 3    Sig: Take 1 tablet (5 mg total) by mouth 2 (two) times daily.    Authorizing Provider: ARIDA, MUHAMMAD A    Ordering User: Sandar Krinke C    

## 2017-11-22 NOTE — Telephone Encounter (Signed)
Pt daughter calling stating after fluid removal on Thursday home health nurse came on Friday and could still hear fluid in lungs   She states Friday/Saturday she was not doing well.  She still has the cough, it is not as bad as the weekend but it is still there Sometimes the cough medicine works and sometimes it doesn't she states  Would like some advise on this Please call back

## 2017-11-22 NOTE — Telephone Encounter (Signed)
Patient needs CXR  And to be seen tomorrow AM if possibe

## 2017-11-22 NOTE — Telephone Encounter (Signed)
Pt requiring PA for Eliquis. Pt's voucher has expired. Prior authorization contact # [phone]

## 2017-11-22 NOTE — Telephone Encounter (Signed)
FYI:  After ordering cxr and scheduling office visit 11/23/17 patient daughter has called back to cancel appt. She stated home health nurse came into to access and she didn't hear any fluid in lungs.

## 2017-11-23 ENCOUNTER — Telehealth: Payer: Self-pay | Admitting: Internal Medicine

## 2017-11-23 ENCOUNTER — Ambulatory Visit: Payer: Medicare Other | Admitting: Internal Medicine

## 2017-11-23 NOTE — Telephone Encounter (Signed)
I think the prescribing provider's nurse handles PAs. I am only authorized to provide refills on NOACs.

## 2017-11-23 NOTE — Telephone Encounter (Signed)
Pt' daughter dropped off FL2 paper to be completed. Paper is up front in Dr. Roby LoftsScott's color folder.

## 2017-11-24 ENCOUNTER — Telehealth: Payer: Self-pay | Admitting: Cardiovascular Disease

## 2017-11-24 NOTE — Telephone Encounter (Signed)
Pt daughter calling stating pt just saw her neurologist Dr Malvin JohnsPotter He placed her on Citalopram for anti depression Just wants to make sure this is okay to take for patient has heart issues  Please call back

## 2017-11-24 NOTE — Telephone Encounter (Signed)
Updated pt's daughter, Rory PercyJayme, who reports pt has one more week of Eliquis before needing refill

## 2017-11-24 NOTE — Telephone Encounter (Signed)
Pt daughter retuning our call  °Please call back ° °

## 2017-11-24 NOTE — Telephone Encounter (Signed)
Reviewed with Eula Listenyan Dunn, PA-C who recommends neurology choose a different SSRI as celexa and eliquis together can increase risk of bleeding.  I have left a message on daughter Jayme's VM to contact the office.

## 2017-11-24 NOTE — Telephone Encounter (Signed)
Reviewed Ryan's recommendations w/daughter, Jayme, who verbalized understanding.

## 2017-11-24 NOTE — Telephone Encounter (Signed)
Patient daughter calling to check status of eliquis PA please call

## 2017-11-24 NOTE — Telephone Encounter (Signed)
Awaiting PA decision.  Key [id number]

## 2017-11-25 ENCOUNTER — Telehealth: Payer: Self-pay | Admitting: Cardiovascular Disease

## 2017-11-25 DIAGNOSIS — Z0279 Encounter for issue of other medical certificate: Secondary | ICD-10-CM

## 2017-11-25 NOTE — Telephone Encounter (Signed)
Notified pt's daughter, Cheryl Winters, that Eliquis PA has been rejected.  Patient has 7 days of Eliquis left and she does not think patient was given a 30 day free card. Jayme will call the pharmacy to inquire. I have started the claim rejection form through [facility]  Key [id number]

## 2017-11-25 NOTE — Telephone Encounter (Signed)
Placed in red folder  

## 2017-11-25 NOTE — Telephone Encounter (Signed)
Please call daughter Rory PercyJayme regarding Eliquis PA.

## 2017-11-28 ENCOUNTER — Ambulatory Visit: Payer: Medicare Other | Admitting: Internal Medicine

## 2017-11-28 NOTE — Telephone Encounter (Signed)
Signed.  Will place in box on return to office.

## 2017-11-29 ENCOUNTER — Telehealth: Payer: Self-pay | Admitting: Cardiovascular Disease

## 2017-11-29 NOTE — Telephone Encounter (Signed)
S/w patient's daughter, Rory PercyJayme. We discussed the appeal process. I suggested they see if Xarelto is covered on patient's drug benefit and to call us back and let us know if it is. Otherwise, we can start the appeal process.

## 2017-11-29 NOTE — Telephone Encounter (Signed)
Spoke with Rory PercyJayme (daughter) stated that the Eliquis was denied and would like a phone call back regarding the cost of Eliquis since the patient will be out of medication this Friday.

## 2017-11-29 NOTE — Telephone Encounter (Signed)
Spoke with Cheryl Winters to let her know form is ready. Will place up front for pick up.

## 2017-11-29 NOTE — Telephone Encounter (Signed)
Pt daughter calling stating the coupon we gave her the pharmacy would not accept it  She states in the hospital they gave her a coupon for $75 dollars for eliquis and thinks this is why they did not accept this coupon   Would like a call back for patient will be completely would this Friday

## 2017-11-30 ENCOUNTER — Ambulatory Visit: Payer: Medicare Other

## 2017-11-30 ENCOUNTER — Other Ambulatory Visit: Payer: Self-pay

## 2017-11-30 MED ORDER — APIXABAN 5 MG PO TABS: 5.0000 mg | ORAL_TABLET | Freq: Two times a day (BID) | ORAL | 3 refills | Status: DC

## 2017-11-30 NOTE — Telephone Encounter (Signed)
Daughter Rory PercyJayme called back stating  per insurance, xarelto would also need PA. States she [facility], [phone], and was told we had not sent Eliquis PA request.  Researched information. [facility] always [facility] which was linked with PA request that was denied.  I called [facility] and received Key [id number]. Completed online PA request.  Request was approved. Caseid: [id number] Coverage dates: 10/31/2017 - 11/30/2018.  I have notified daughter Rory PercyJayme.  Provided samples until prescription is shipped.  Samples of this drug were given to the patient, quantity 2 boxes, Lot Number [id number] Exp: 6/21

## 2017-11-30 NOTE — Telephone Encounter (Signed)
Pt calling asking about appeal

## 2017-12-01 ENCOUNTER — Telehealth: Payer: Self-pay | Admitting: Cardiovascular Disease

## 2017-12-01 MED ORDER — APIXABAN 5 MG PO TABS: 5.0000 mg | ORAL_TABLET | Freq: Two times a day (BID) | ORAL | 4 refills | Status: DC

## 2017-12-01 NOTE — Telephone Encounter (Signed)
Spoke with patients daughter and reviewed that [facility] has not processed prescriptions because of different address and phone number. Reviewed with her that samples are up front for her to pick up and that she will also need to verify with [facility] patient information. She reports that her mother goes into a nursing home tomorrow and wonders if they will send medications to them. Reviewed that she will need to check with facility because their process is probably different. Instructed her to pick up samples, check with facility, and then [facility] to determine process for obtaining medication. She verbalized understanding of our conversation, agreement with plan, and had no further questions at this time.

## 2017-12-01 NOTE — Telephone Encounter (Signed)
Spoke with patients daughter who is very upset over not being able to get prescription for patient. Advised that I would give them a call regarding prescriptions and then will let her know the outcome. She was appreciative for the call back and assistance.

## 2017-12-01 NOTE — Telephone Encounter (Signed)
Please call regarding Eliquis rx.

## 2017-12-01 NOTE — Telephone Encounter (Addendum)
Spoke with Elnita Maxwellheryl at [facility]. She reviewed patient information and did not see prescription that was sent in yesterday. She did see that [facility] with cost of $97.28 towards deductible. Resubmitted prescription and they still did not see it come through. Call then transferred to pharmacy and after further review it was determined that patient information did not match and that is why they have not received it. They have a different address and phone number listed for the patient. Provided patient information and prescription instructions for them to process. Will notify patients daughter of this information.

## 2017-12-02 ENCOUNTER — Ambulatory Visit: Payer: Medicare Other | Admitting: Internal Medicine

## 2017-12-12 ENCOUNTER — Telehealth: Payer: Self-pay

## 2017-12-12 NOTE — Telephone Encounter (Signed)
Spoke with patients daughter regarding [facility] paper work. Daughter advised that [facility]. She is In a facility. Med list in our system is correct. Shredded paper work.

## 2017-12-15 ENCOUNTER — Ambulatory Visit: Payer: Medicare Other | Admitting: Physician Assistant

## 2017-12-24 NOTE — Progress Notes (Signed)
Cardiology Office Note Date:  12/28/2017  Patient ID:  Cheryl Winters, DOB 06-10-1934, MRN [id number] PCP:  Dale [place] Gorin, MD  Cardiologist:  Dr. Kirke Corin, MD    Chief Complaint: Follow up  History of Present Illness: Cheryl Winters is a 84 y.o. female with history of persistent Afib on Eliquis, chronic systolic CHF, left pleural effusion, bradycardia that improved with stopping of propranolol in 2016, HTN, HLD, dementia, and frequent UTI who presents for follow up of her Afib.   She was admitted in 10/2017 with new onset Afib with RVR. She was rate controlled and started on Eliquis. Her home medications were continued in additon to rate controlling medications (metoprolol and IV Cardizem), and as a result she required transfer to the ICU for hypotension. Echo showed an EF of 30-35%, diffuse hypokinesis, moderate to severe mitral regurgitation, severely dilated left atrium, moderately dilated RV with mildly reduced RVSF, moderate pulmonary hypertension with a PASP of 55 mmHg. She was most recently seen by Dr. Kirke Corin on 11/15/17 and continued to note fatigue and dizziness. She was referred to pulmonology for evaluation of her persistent left pleural effusion. She underwent successful ultrasound-guided thoracentesis on 11/17/2017, with approximately 550 mL of clear yellow fluid aspirated. Labs on this fluid showed no growth, protein of 2.2 (transudate), LDH 66, cytology with mixed inflammation and reactive mesothelial cells. There has been considerable difficulty in getting the patient's DOAC approved with her insurance leading to a significant delay on their part.   She comes in today accompanied by 2 daughters.  Family indicates the patient has had worsening cough, shortness of breath, lower extremity swelling, decreased appetite/early satiety.  Cough is productive of clear to white to yellow sputum. Weight is down 11 pounds today compared to her last office visit with [place] on 11/15/2017 (166-->155  pounds).  She has been compliant with all medications as far as the family knows as the patient lives in an assisted living facility.  She also has been dealing with episodes of increased somnolence and will fall asleep mid sentence or while attempting to eat.  Patient denies any chest pain, nausea, vomiting, dizziness, presyncope, or syncope.  Patient does tell me today that "something just does not feel right."  History is obtained from the 2 daughters given the patient's underlying dementia.   Past Medical History:  Diagnosis Date  . Abdominal pain, right upper quadrant   . Benign essential tremor   . Carpal tunnel syndrome   . Chronic combined systolic and diastolic CHF (congestive heart failure) (HCC)    a. TTE 10/2017: EF 30-35%, diffuse HK, not technically sufficient to allow for LV diastolic function, moderate to severe MR, severely dilated left atrium measuring 54 mm, moderately dilated RV with mild systolic reduction, mildly dilated right atrium, moderate TR, PASP 55 mmHg, moderate left-sided pleural effusion  . Dementia    a. mixed vascular and Alzheimer's  . DJD (degenerative joint disease) of knee   . GERD (gastroesophageal reflux disease)   . History of stress test    a. 10/2015 showed no evidence of ischemia, EF 62%, low risk study  . HTN (hypertension)   . Hyperlipidemia   . Persistent atrial fibrillation (HCC)    a. diagnosed 10/2017; b. CHADS2VASc => 6 (CHF, HTN, age x 2, vascular disease, female); c. on Eliquis  . Syncope and collapse   . Systolic CHF (HCC)   . Ulnar nerve lesion   . UTI (urinary tract infection)   . Vitamin D deficiency  Past Surgical History:  Procedure Laterality Date  . Bilateral Bletharoplastices    . BREAST BIOPSY  1957 and 1962   x 2, Nml per pt  . BREAST CYST EXCISION     x 2 - nml path per pt  . CARPAL TUNNEL RELEASE  2004  . CATARACT EXTRACTION    . TOTAL KN[place] AR[place]LASTY     [place]  . TOTAL SHOULDER  REPLACEMENT  08/2010   left  . TUBAL LIGATION    . UMBILICAL HERNIA REPAIR    . VAGINAL DELIVERY     1 set Twins/2 single births    Current Meds  Medication Sig  . alum & mag hydroxide-simeth (MAALOX/MYLANTA) 200-200-20 MG/5ML suspension Take 30 mLs by mouth every 6 (six) hours as needed for indigestion or heartburn.  Marland Kitchen. apixaban (ELIQUIS) 5 MG TABS tablet Take 1 tablet (5 mg total) by mouth 2 (two) times daily.  . furosemide (LASIX) 20 MG tablet Take 1 tablet (20 mg total) by mouth 2 (two) times daily.  Marland Kitchen. lisinopril (PRINIVIL,ZESTRIL) 2.5 MG tablet Take 1 tablet (2.5 mg total) by mouth daily.  . metoprolol tartrate (LOPRESSOR) 50 MG tablet Take 1 tablet (50 mg total) by mouth 2 (two) times daily.  . potassium chloride (K-DUR,KLOR-CON) 10 MEQ tablet Take 1 tablet (10 mEq total) by mouth 2 (two) times daily.    Allergies:   Amlodipine; Amoxicillin-pot clavulanate; Penicillins; Codeine; and Codeine sulfate   Social History:  The patient  reports that  has never smoked. she has never used smokeless tobacco. She reports that she does not drink alcohol or use drugs.   Family History:  The patient's family history includes Alcohol abuse in her other; Arthritis in her mother; Brain cancer (age of onset: 6442) in her father; Breast cancer in her maternal aunt; Dementia in her mother; Early death (age of onset: 7342) in her father; Hypertension in her mother; Stroke in her mother.  ROS:   Review of Systems  Unable to perform ROS: Dementia     PHYSICAL EXAM:  VS:  BP 100/80 (BP Location: Right Arm, Patient Position: Sitting, Cuff Size: Normal)   Pulse (!) 124   Ht 4\' 11"  (1.499 m)   Wt 155 lb (70.3 kg)   BMI 31.31 kg/m  BMI: Body mass index is 31.31 kg/m.  Physical Exam  Constitutional: She is oriented to person, place, and time. She appears well-developed and well-nourished.  HENT:  Head: Normocephalic and atraumatic.  Eyes: Right eye exhibits no discharge. Left eye exhibits no discharge.   Neck: Normal range of motion. JVD present.  JVD elevated approximately 8-10 cm  Cardiovascular: S1 normal and S2 normal. An irregularly irregular rhythm present. Tachycardia present. Exam reveals no distant heart sounds, no friction rub, no midsystolic click and no opening snap.  Murmur heard. High-pitched blowing holosystolic murmur is present with a grade of 3/6 at the apex. Pulses:      Posterior tibial pulses are 2+ on the right side, and 2+ on the left side.  Pulmonary/Chest: Effort normal. No respiratory distress. She has decreased breath sounds. She has no wheezes. She has rales in the right lower field and the left lower field. She exhibits no tenderness.  Abdominal: Soft. She exhibits no distension. There is no tenderness.  Musculoskeletal: She exhibits edema.  1-2+ bilateral pitting edema to the knees  Neurological: She is alert and oriented to person, place, and time.  Skin: Skin is warm and dry. No cyanosis. Nails  show no clubbing.  Psychiatric: She has a normal mood and affect. Her speech is normal and behavior is normal. Judgment and thought content normal.     EKG:  Was ordered and interpreted by me today. Shows A. fib with RVR, 124 bpm, low voltage QRS, possible prior septal infarct, possible prior inferior infarct, nonspecific ST-T changes  Recent Labs: 07/15/2017: TSH 2.78 11/02/2017: Magnesium 1.9 11/08/2017: ALT 25 11/14/2017: B Natriuretic Peptide 648.0; BUN 22; Creatinine, Ser 0.96; Hemoglobin 13.2; Platelets 201; Potassium 3.6; Sodium 140  07/15/2017: Cholesterol 245; HDL 64.10; LDL Cholesterol 169; Total CHOL/HDL Ratio 4; Triglycerides 59.0; VLDL 11.8   CrCl cannot be calculated (Patient's most recent lab result is older than the maximum 21 days allowed.).   Wt Readings from Last 3 Encounters:  12/28/17 155 lb (70.3 kg)  11/17/17 163 lb (73.9 kg)  11/15/17 166 lb 4 oz (75.4 kg)     Other studies reviewed: Additional studies/records reviewed today include:  summarized above  ASSESSMENT AND PLAN:  1. Persistent Afib with RVR: Remains in Afib with ventricular rates in the 120s bpm. Her blood pressure of 100/80 precludes titration of metoprolol for added rate control. Her soft BP and cardiomyopathy preclude addition of diltiazem for added rate control.  Discussed with MD.  We will hold her metoprolol at this time given her relative hypotension.  We will give her a one-time dose of IV digoxin 0.5 mg followed by amiodarone infusion for rate control with the bolus being given over 60 minutes in an effort to prevent hypotension.  Continue Eliquis 5 mg twice daily. CHADS2VASc at least 6 (CHF, HTN, age x 2, vascular disease, female).  She will benefit from attempted electrical cardioversion in the near future though we will need to have her loaded with amiodarone prior to attempting this given her dilated left atrium.  2. Acute on chronic chronic systolic CHF: She does appear grossly volume overloaded at this time. Possibly in the setting of her history of Afib with RVR. However, underlying ischemia cannot be ruled out. Given her advanced age and comorbid conditions, including dementia, she has been felt to not be a good candidate for invasive ischemic evaluation.  Discussed with MD.  We will plan for direct admission for inpatient rate control and IV diuresis.  Due to her A. fib with RVR, volume overload, and relative hypotension we are unable to achieve this as an outpatient.  For now, we will need to hold her metoprolol as we will need blood pressure room for diuresis.  We will also hold her lisinopril.  We will plan to resume beta-blocker and ACE inhibitor therapy as heart rate and blood pressure allow during her hospital admission.  Relative hypotension precludes addition of spironolactone at this time.  Strict I's and O's and daily weights.  3. Left-sided pleural effusion: Status post ultrasound-guided thoracentesis 11/17/2017. Follow up CXR showed improved left  lower lung aeration.   4. Dementia: Per PCP. Unlikely to be a good candidate for invasive ischemic evaluation of there cardiomyopathy as above.   5. HTN: Blood pressure on the soft side today at 100/80.  Hold metoprolol and lisinopril as above to allow for adequate diuresis.  6. Somnolence: Needs ABG to evaluate for CO2 retention.  Disposition: F/u with Dr. Kirke Corin following hospital admission.  Current medicines are reviewed at length with the patient today.  The patient did not have any concerns regarding medicines.  Signed, Eula Listen, PA-C 12/28/2017 2:11 PM     [facility] -  [address], [place] [address] [phone]

## 2017-12-28 ENCOUNTER — Inpatient Hospital Stay
Admission: AD | Admit: 2017-12-28 | Discharge: 2017-12-30 | DRG: 308 | Disposition: A | Discharge status: Skilled Nursing Facility | Payer: Medicare Other | Source: Ambulatory Visit | Attending: Internal Medicine | Admitting: Internal Medicine

## 2017-12-28 ENCOUNTER — Ambulatory Visit (INDEPENDENT_AMBULATORY_CARE_PROVIDER_SITE_OTHER): Payer: Medicare Other | Admitting: Physician Assistant

## 2017-12-28 ENCOUNTER — Inpatient Hospital Stay: Payer: Medicare Other

## 2017-12-28 ENCOUNTER — Other Ambulatory Visit: Payer: Self-pay

## 2017-12-28 ENCOUNTER — Encounter: Payer: Self-pay | Admitting: Physician Assistant

## 2017-12-28 VITALS — BP 100/80 | HR 124 | Ht 59.0 in | Wt 155.0 lb

## 2017-12-28 DIAGNOSIS — I481 Persistent atrial fibrillation: Secondary | ICD-10-CM

## 2017-12-28 DIAGNOSIS — Z7901 Long term (current) use of anticoagulants: Secondary | ICD-10-CM | POA: Diagnosis not present

## 2017-12-28 DIAGNOSIS — I5023 Acute on chronic systolic (congestive) heart failure: Secondary | ICD-10-CM

## 2017-12-28 DIAGNOSIS — Z880 Allergy status to penicillin: Secondary | ICD-10-CM | POA: Diagnosis not present

## 2017-12-28 DIAGNOSIS — E785 Hyperlipidemia, unspecified: Secondary | ICD-10-CM | POA: Diagnosis present

## 2017-12-28 DIAGNOSIS — Z888 Allergy status to other drugs, medicaments and biological substances status: Secondary | ICD-10-CM | POA: Diagnosis not present

## 2017-12-28 DIAGNOSIS — R531 Weakness: Secondary | ICD-10-CM | POA: Diagnosis present

## 2017-12-28 DIAGNOSIS — I4819 Other persistent atrial fibrillation: Secondary | ICD-10-CM

## 2017-12-28 DIAGNOSIS — I10 Essential (primary) hypertension: Secondary | ICD-10-CM | POA: Diagnosis not present

## 2017-12-28 DIAGNOSIS — I4891 Unspecified atrial fibrillation: Secondary | ICD-10-CM | POA: Diagnosis not present

## 2017-12-28 DIAGNOSIS — R000 Tachycardia, unspecified: Secondary | ICD-10-CM | POA: Diagnosis present

## 2017-12-28 DIAGNOSIS — F0390 Unspecified dementia without behavioral disturbance: Secondary | ICD-10-CM | POA: Diagnosis present

## 2017-12-28 DIAGNOSIS — Z7189 Other specified counseling: Secondary | ICD-10-CM | POA: Diagnosis not present

## 2017-12-28 DIAGNOSIS — Z515 Encounter for palliative care: Secondary | ICD-10-CM | POA: Diagnosis not present

## 2017-12-28 DIAGNOSIS — R400 Somnolence: Secondary | ICD-10-CM

## 2017-12-28 DIAGNOSIS — I340 Nonrheumatic mitral (valve) insufficiency: Secondary | ICD-10-CM | POA: Diagnosis present

## 2017-12-28 DIAGNOSIS — Z96619 Presence of unspecified artificial shoulder joint: Secondary | ICD-10-CM | POA: Diagnosis present

## 2017-12-28 DIAGNOSIS — R0602 Shortness of breath: Secondary | ICD-10-CM

## 2017-12-28 DIAGNOSIS — J9601 Acute respiratory failure with hypoxia: Secondary | ICD-10-CM | POA: Diagnosis present

## 2017-12-28 DIAGNOSIS — G309 Alzheimer's disease, unspecified: Secondary | ICD-10-CM | POA: Diagnosis not present

## 2017-12-28 DIAGNOSIS — Z96659 Presence of unspecified artificial knee joint: Secondary | ICD-10-CM | POA: Diagnosis present

## 2017-12-28 DIAGNOSIS — I420 Dilated cardiomyopathy: Secondary | ICD-10-CM | POA: Diagnosis present

## 2017-12-28 DIAGNOSIS — Z96653 Presence of artificial knee joint, bilateral: Secondary | ICD-10-CM | POA: Diagnosis present

## 2017-12-28 DIAGNOSIS — J90 Pleural effusion, not elsewhere classified: Secondary | ICD-10-CM | POA: Diagnosis present

## 2017-12-28 DIAGNOSIS — I959 Hypotension, unspecified: Secondary | ICD-10-CM | POA: Diagnosis present

## 2017-12-28 DIAGNOSIS — J918 Pleural effusion in other conditions classified elsewhere: Secondary | ICD-10-CM | POA: Diagnosis present

## 2017-12-28 DIAGNOSIS — I110 Hypertensive heart disease with heart failure: Secondary | ICD-10-CM | POA: Diagnosis present

## 2017-12-28 DIAGNOSIS — Z79899 Other long term (current) drug therapy: Secondary | ICD-10-CM

## 2017-12-28 DIAGNOSIS — R946 Abnormal results of thyroid function studies: Secondary | ICD-10-CM | POA: Diagnosis present

## 2017-12-28 DIAGNOSIS — Z8249 Family history of ischemic heart disease and other diseases of the circulatory system: Secondary | ICD-10-CM | POA: Diagnosis not present

## 2017-12-28 DIAGNOSIS — F0280 Dementia in other diseases classified elsewhere without behavioral disturbance: Secondary | ICD-10-CM | POA: Diagnosis not present

## 2017-12-28 DIAGNOSIS — I272 Pulmonary hypertension, unspecified: Secondary | ICD-10-CM | POA: Diagnosis present

## 2017-12-28 DIAGNOSIS — G250 Essential tremor: Secondary | ICD-10-CM | POA: Diagnosis present

## 2017-12-28 DIAGNOSIS — Z885 Allergy status to narcotic agent status: Secondary | ICD-10-CM

## 2017-12-28 DIAGNOSIS — K219 Gastro-esophageal reflux disease without esophagitis: Secondary | ICD-10-CM | POA: Diagnosis present

## 2017-12-28 DIAGNOSIS — I5043 Acute on chronic combined systolic (congestive) and diastolic (congestive) heart failure: Secondary | ICD-10-CM | POA: Diagnosis present

## 2017-12-28 LAB — BLOOD GAS, ARTERIAL
Acid-Base Excess: 2.8 mmol/L — ABNORMAL HIGH (ref 0.0–2.0)
Bicarbonate: 27.2 mmol/L (ref 20.0–28.0)
FIO2: 0.28
O2 Saturation: 97.4 %
PATIENT TEMPERATURE: 37
pCO2 arterial: 40 mmHg (ref 32.0–48.0)
pH, Arterial: 7.44 (ref 7.350–7.450)
pO2, Arterial: 92 mmHg (ref 83.0–108.0)

## 2017-12-28 LAB — COMPREHENSIVE METABOLIC PANEL
ALBUMIN: 3.4 g/dL — AB (ref 3.5–5.0)
ALK PHOS: 52 U/L (ref 38–126)
ALT: 19 U/L (ref 14–54)
ANION GAP: 9 (ref 5–15)
AST: 27 U/L (ref 15–41)
BUN: 27 mg/dL — ABNORMAL HIGH (ref 6–20)
CO2: 25 mmol/L (ref 22–32)
Calcium: 9.1 mg/dL (ref 8.9–10.3)
Chloride: 104 mmol/L (ref 101–111)
Creatinine, Ser: 0.97 mg/dL (ref 0.44–1.00)
GFR calc Af Amer: >60 mL/min (ref >60)
GFR calc non Af Amer: 53 mL/min — ABNORMAL LOW (ref >60)
GLUCOSE: 128 mg/dL — AB (ref 65–99)
POTASSIUM: 3.7 mmol/L (ref 3.5–5.1)
SODIUM: 138 mmol/L (ref 135–145)
Total Bilirubin: 1.2 mg/dL (ref 0.3–1.2)
Total Protein: 6.4 g/dL — ABNORMAL LOW (ref 6.5–8.1)

## 2017-12-28 LAB — MAGNESIUM: Magnesium: 1.9 mg/dL (ref 1.7–2.4)

## 2017-12-28 LAB — CBC
HCT: 40.9 % (ref 35.0–47.0)
HEMOGLOBIN: 13.6 g/dL (ref 12.0–16.0)
MCH: 28.5 pg (ref 26.0–34.0)
MCHC: 33.3 g/dL (ref 32.0–36.0)
MCV: 85.6 fL (ref 80.0–100.0)
Platelets: 162 10*3/uL (ref 150–440)
RBC: 4.78 MIL/uL (ref 3.80–5.20)
RDW: 16.1 % — AB (ref 11.5–14.5)
WBC: 4.7 10*3/uL (ref 3.6–11.0)

## 2017-12-28 LAB — TSH: TSH: 22.49 u[IU]/mL — ABNORMAL HIGH (ref 0.350–4.500)

## 2017-12-28 LAB — MRSA PCR SCREENING: MRSA BY PCR: NEGATIVE

## 2017-12-28 LAB — DIGOXIN LEVEL: Digoxin Level: 2.5 ng/mL — ABNORMAL HIGH (ref 0.8–2.0)

## 2017-12-28 MED ORDER — ACETAMINOPHEN 650 MG RE SUPP: 650.0000 mg | Freq: Four times a day (QID) | RECTAL | Status: DC | PRN

## 2017-12-28 MED ORDER — AMIODARONE HCL IN DEXTROSE 360-4.14 MG/200ML-% IV SOLN
30.0000 mg/h | INTRAVENOUS | Status: DC
  Administered 2017-12-28 – 2017-12-29 (×3): 30 mg/h via INTRAVENOUS
  Filled 2017-12-28 (×3): qty 200

## 2017-12-28 MED ORDER — ONDANSETRON HCL 4 MG PO TABS: 4.0000 mg | ORAL_TABLET | Freq: Four times a day (QID) | ORAL | Status: DC | PRN

## 2017-12-28 MED ORDER — APIXABAN 5 MG PO TABS
5.0000 mg | ORAL_TABLET | Freq: Two times a day (BID) | ORAL | Status: DC
  Administered 2017-12-28 – 2017-12-30 (×4): 5 mg via ORAL
  Filled 2017-12-28 (×4): qty 1

## 2017-12-28 MED ORDER — ONDANSETRON HCL 4 MG/2ML IJ SOLN: 4.0000 mg | Freq: Four times a day (QID) | INTRAMUSCULAR | Status: DC | PRN

## 2017-12-28 MED ORDER — DIGOXIN 0.25 MG/ML IJ SOLN
0.5000 mg | Freq: Once | INTRAMUSCULAR | Status: AC
  Administered 2017-12-28: 0.5 mg via INTRAVENOUS
  Filled 2017-12-28: qty 2

## 2017-12-28 MED ORDER — FAMOTIDINE 20 MG PO TABS
20.0000 mg | ORAL_TABLET | Freq: Two times a day (BID) | ORAL | Status: DC
  Administered 2017-12-28 – 2017-12-30 (×4): 20 mg via ORAL
  Filled 2017-12-28 (×4): qty 1

## 2017-12-28 MED ORDER — POTASSIUM CHLORIDE CRYS ER 10 MEQ PO TBCR: 10.0000 meq | EXTENDED_RELEASE_TABLET | Freq: Two times a day (BID) | ORAL | Status: DC

## 2017-12-28 MED ORDER — ALUM & MAG HYDROXIDE-SIMETH 200-200-20 MG/5ML PO SUSP: 30.0000 mL | Freq: Four times a day (QID) | ORAL | Status: DC | PRN

## 2017-12-28 MED ORDER — POTASSIUM CHLORIDE CRYS ER 10 MEQ PO TBCR
10.0000 meq | EXTENDED_RELEASE_TABLET | Freq: Two times a day (BID) | ORAL | Status: DC
  Administered 2017-12-28 – 2017-12-30 (×4): 10 meq via ORAL
  Filled 2017-12-28 (×4): qty 1

## 2017-12-28 MED ORDER — AMIODARONE HCL IN DEXTROSE 360-4.14 MG/200ML-% IV SOLN
60.0000 mg/h | INTRAVENOUS | Status: AC
  Administered 2017-12-28: 60 mg/h via INTRAVENOUS
  Filled 2017-12-28: qty 200

## 2017-12-28 MED ORDER — FUROSEMIDE 10 MG/ML IJ SOLN
20.0000 mg | Freq: Two times a day (BID) | INTRAMUSCULAR | Status: DC
  Administered 2017-12-28 – 2017-12-29 (×3): 20 mg via INTRAVENOUS
  Filled 2017-12-28 (×3): qty 2

## 2017-12-28 MED ORDER — APIXABAN 5 MG PO TABS: 5.0000 mg | ORAL_TABLET | Freq: Two times a day (BID) | ORAL | Status: DC

## 2017-12-28 MED ORDER — ACETAMINOPHEN 325 MG PO TABS: 650.0000 mg | ORAL_TABLET | Freq: Four times a day (QID) | ORAL | Status: DC | PRN

## 2017-12-28 NOTE — Progress Notes (Signed)
MD was made aware of patient digoxin level 2.5, no new order , continue to monitor

## 2017-12-28 NOTE — Plan of Care (Signed)
  Education: Knowledge of General Education information will improve 12/28/2017 1701 - Progressing by Tomie ChinaJackson, Drucilla Cumber Cecelie, RN   Education: Knowledge of General Education information will improve 12/28/2017 1701 - Progressing by Tomie ChinaJackson, Malee Grays Cecelie, RN   Education: Knowledge of General Education information will improve 12/28/2017 1701 - Progressing by Tomie ChinaJackson, Takashi Korol Cecelie, RN   Education: Knowledge of General Education information will improve 12/28/2017 1701 - Progressing by Tomie ChinaJackson, Liannah Yarbough Cecelie, RN   Education: Knowledge of General Education information will improve 12/28/2017 1701 - Progressing by Tomie ChinaJackson, Sherron Mapp Cecelie, RN   Education: Knowledge of General Education information will improve 12/28/2017 1701 - Progressing by Tomie ChinaJackson, Dijon Cosens Cecelie, RN

## 2017-12-28 NOTE — Patient Instructions (Signed)
Medication Instructions: - Your physician recommends that you continue on your current medications as directed. Please refer to the Current Medication list given to you today.  Labwork: - none ordered in clinic  Procedures/Testing: - none ordered in clinic  Follow-Up: - [facility] are being Ad[facility] for atrial fibrillation  Any Additional Special Instructions Will Be Listed Below (If Applicable).     If you need a refill on your cardiac medications before your next appointment, please call your pharmacy.

## 2017-12-28 NOTE — Care Management (Signed)
Direct admit for persistent  A fib with RVR.  She is not a good candidate for invasive ischemic  workup.  She was referred to [facility] at time of discharge in jan 2019, but now there is documentation in the chart that patient is from a facility.  She has dementia and family was unable to meet her care needs in the home setting.  Will investigate

## 2017-12-28 NOTE — H&P (Addendum)
[facility] [place] at [facility]   PATIENT NAME: Cheryl Winters    MR#:  [id number]  DATE OF BIRTH:  [birth year]  DATE OF ADMISSION:  12/28/2017  PRIMARY CARE PHYSICIAN: Dale Derby, MD   REQUESTING/REFERRING PHYSICIAN: Eula Listen  CHIEF COMPLAINT:  Sent in for rapid atrial fibrillation.  HISTORY OF PRESENT ILLNESS:  Cheryl Winters  is a 84 y.o. female with a known history of atrial fibrillation, pleural effusion and congestive heart failure.  She was seen in cardiology office today and found in rapid atrial fibrillation.  She states that she has not been feeling good.  She has had some lower extremity edema.  She has been feeling weak.  She is living at [facility] assisted living.  She recently had a thoracentesis removing fluid off the lung in January.  Patient has a history of dementia.  History obtained from family at bedside and old chart.  PAST MEDICAL HISTORY:   Past Medical History:  Diagnosis Date  . Abdominal pain, right upper quadrant   . Benign essential tremor   . Carpal tunnel syndrome   . Chronic combined systolic and diastolic CHF (congestive heart failure) (HCC)    a. TTE 10/2017: EF 30-35%, diffuse HK, not technically sufficient to allow for LV diastolic function, moderate to severe MR, severely dilated left atrium measuring 54 mm, moderately dilated RV with mild systolic reduction, mildly dilated right atrium, moderate TR, PASP 55 mmHg, moderate left-sided pleural effusion  . Dementia    a. mixed vascular and Alzheimer's  . DJD (degenerative joint disease) of knee   . GERD (gastroesophageal reflux disease)   . History of stress test    a. 10/2015 showed no evidence of ischemia, EF 62%, low risk study  . HTN (hypertension)   . Hyperlipidemia   . Persistent atrial fibrillation (HCC)    a. diagnosed 10/2017; b. CHADS2VASc => 6 (CHF, HTN, age x 2, vascular disease, female); c. on Eliquis  . Syncope and collapse   . Systolic CHF (HCC)   .  Ulnar nerve lesion   . UTI (urinary tract infection)   . Vitamin D deficiency     PAST SURGICAL HISTORY:   Past Surgical History:  Procedure Laterality Date  . Bilateral Bletharoplastices    . BREAST BIOPSY  1957 and 1962   x 2, Nml per pt  . BREAST CYST EXCISION     x 2 - nml path per pt  . CARPAL TUNNEL RELEASE  2004  . CATARACT EXTRACTION    . TOTAL KNEE ARTHROPLASTY     bilateral, total, Marcy Panning  . TOTAL SHOULDER REPLACEMENT  08/2010   left  . TUBAL LIGATION    . UMBILICAL HERNIA REPAIR    . VAGINAL DELIVERY     1 set Twins/2 single births    SOCIAL HISTORY:   Social History   Tobacco Use  . Smoking status: Never Smoker  . Smokeless tobacco: Never Used  Substance Use Topics  . Alcohol use: No    FAMILY HISTORY:   Family History  Problem Relation Age of Onset  . Hypertension Mother   . Arthritis Mother   . Stroke Mother   . Dementia Mother   . Early death Father 18       from brain cancer  . Brain cancer Father 13  . Breast cancer Maternal Aunt   . Alcohol abuse Other     DRUG ALLERGIES:   Allergies  Allergen Reactions  . Amlodipine  Other (See Comments)    malaise malaise malaise  . Amoxicillin-Pot Clavulanate Other (See Comments)    Other reaction(s): Other (See Comments) Cannot remember. Cannot remember.  . Penicillins Other (See Comments)    Has patient had a PCN reaction causing immediate rash, facial/tongue/throat swelling, SOB or lightheadedness with hypotension: Unknown Has patient had a PCN reaction causing severe rash involving mucus membranes or skin necrosis: Unknown Has patient had a PCN reaction that required hospitalization: Unknown Has patient had a PCN reaction occurring within the last 10 years: No If all of the above answers are "NO", then may proceed with Cephalosporin use. Cannot remember.  . Codeine Rash  . Codeine Sulfate Rash    REVIEW OF SYSTEMS:  CONSTITUTIONAL: No fever, chills or sweats.  Positive for cold  feeling.  Positive for fatigue.  Positive for weight loss.  Poor appetite. EYES: No blurred or double vision.  EARS, NOSE, AND THROAT: No tinnitus or ear pain. No sore throat RESPIRATORY: Positive for cough and shortness of breath no.  wheezing or hemoptysis.  CARDIOVASCULAR: No chest pain, orthopnea.  Positive for edema GASTROINTESTINAL: No nausea, vomiting, diarrhea or abdominal pain. No blood in bowel movements GENITOURINARY: No dysuria, hematuria.  ENDOCRINE: No polyuria, nocturia,  HEMATOLOGY: No anemia, easy bruising or bleeding SKIN: No rash or lesion. MUSCULOSKELETAL: No joint pain or arthritis.   NEUROLOGIC: No tingling, numbness, weakness.  Positive for dizziness PSYCHIATRY: No anxiety or depression.   MEDICATIONS AT HOME:   Prior to Admission medications   Medication Sig Start Date End Date Taking? Authorizing Provider  alum & mag hydroxide-simeth (MAALOX/MYLANTA) 200-200-20 MG/5ML suspension Take 30 mLs by mouth every 6 (six) hours as needed for indigestion or heartburn. 11/04/17   Shaune Pollack, MD  apixaban (ELIQUIS) 5 MG TABS tablet Take 1 tablet (5 mg total) by mouth 2 (two) times daily. 12/01/17   Iran Ouch, MD  furosemide (LASIX) 20 MG tablet Take 1 tablet (20 mg total) by mouth 2 (two) times daily. 11/17/17   Iran Ouch, MD  lisinopril (PRINIVIL,ZESTRIL) 2.5 MG tablet Take 1 tablet (2.5 mg total) by mouth daily. 11/09/17 02/07/18  Sondra Barges, PA-C  metoprolol tartrate (LOPRESSOR) 50 MG tablet Take 1 tablet (50 mg total) by mouth 2 (two) times daily. 11/09/17   Dunn, Raymon Mutton, PA-C  potassium chloride (K-DUR,KLOR-CON) 10 MEQ tablet Take 1 tablet (10 mEq total) by mouth 2 (two) times daily. 11/17/17   Iran Ouch, MD      VITAL SIGNS:  Blood pressure 120/85, pulse (!) 120, temperature 97.6 F (36.4 C), temperature source Oral, resp. rate 18, height 4\' 11"  (1.499 m), weight 70.2 kg (154 lb 11.2 oz), SpO2 100 %.  Patient did have a pulse ox of 87% on room  air PHYSICAL EXAMINATION:  GENERAL:  84 y.o.-year-old patient lying in the bed with no acute distress.  EYES: Pupils equal, round, reactive to light and accommodation. No scleral icterus. Extraocular muscles intact.  HEENT: Head atraumatic, normocephalic. Oropharynx and nasopharynx clear.  NECK:  Supple, no jugular venous distention. No thyroid enlargement, no tenderness.  LUNGS:  decreased breath sounds left base, no wheezing, rales,rhonchi or crepitation. No use of accessory muscles of respiration.  CARDIOVASCULAR: S1, S2  irregularly irregular tachycardic . No murmurs, rubs, or gallops.  ABDOMEN: Soft, nontender, nondistended. Bowel sounds present. No organomegaly or mass.  EXTREMITIES: 2+ pedal edema, cyanosis, or clubbing.  NEUROLOGIC: Cranial nerves II through XII are intact. Muscle strength 5/5 in all  extremities. Sensation intact. Gait not checked.  PSYCHIATRIC: The patient is alert and oriented x 3.  SKIN: No rash, lesion, or ulcer.   LABORATORY PANEL:   Laboratory data ordered  RADIOLOGY:  Chest x-ray shows left pleural effusion  EKG:  Telemetry monitoring shows atrial fibrillation 120 bpm  IMPRESSION AND PLAN:   1.  Acute hypoxic respiratory failure.  Oxygen supplementation.  Check pulse ox on a daily basis.  Pulse ox was 87% on room air. 2.  Atrial fibrillation with rapid ventricular response.  Patient placed on amiodarone drip and digoxin.  Patient also on Eliquis 3.  Left pleural effusion.  Await chest x-ray official report by radiologist.  4.  Acute on chronic systolic congestive heart failure.  Patient placed on IV Lasix.  Holding metoprolol at this time with relative hypotension.  Holding ACE inhibitor at this time. 5.  Dementia. 6.  Essential hypertension.  Blood pressure in outpatient office was on the lower side.  Hold metoprolol and ACE inhibitor at this point. 7.  Fatigue.  Check TSH. 8.  Weakness physical therapy evaluation 9. gerd add pepcid  I have  reviewed the chest x-ray, telemetry monitoring and laboratory data is ordered.  Management plans discussed with the patient, family and they are in agreement.  CODE STATUS: full code  TOTAL TIME TAKING CARE OF THIS PATIENT: 50 minutes.    Alford Highlandichard Elgie Maziarz M.D on 12/28/2017 at 7:05 PM  Between 7am to 6pm - Pager - [phone]  After 6pm call admission pager [phone]  [facility] Office  [phone]  CC: Primary care physician; Dale DurhamScott, Charlene, MD

## 2017-12-29 DIAGNOSIS — I4891 Unspecified atrial fibrillation: Secondary | ICD-10-CM

## 2017-12-29 DIAGNOSIS — I5023 Acute on chronic systolic (congestive) heart failure: Secondary | ICD-10-CM

## 2017-12-29 DIAGNOSIS — R400 Somnolence: Secondary | ICD-10-CM

## 2017-12-29 DIAGNOSIS — G309 Alzheimer's disease, unspecified: Secondary | ICD-10-CM

## 2017-12-29 DIAGNOSIS — R0602 Shortness of breath: Secondary | ICD-10-CM

## 2017-12-29 DIAGNOSIS — J90 Pleural effusion, not elsewhere classified: Secondary | ICD-10-CM

## 2017-12-29 DIAGNOSIS — I420 Dilated cardiomyopathy: Secondary | ICD-10-CM

## 2017-12-29 DIAGNOSIS — Z7189 Other specified counseling: Secondary | ICD-10-CM

## 2017-12-29 DIAGNOSIS — Z515 Encounter for palliative care: Secondary | ICD-10-CM

## 2017-12-29 DIAGNOSIS — F0280 Dementia in other diseases classified elsewhere without behavioral disturbance: Secondary | ICD-10-CM

## 2017-12-29 LAB — BASIC METABOLIC PANEL
ANION GAP: 10 (ref 5–15)
BUN: 25 mg/dL — ABNORMAL HIGH (ref 6–20)
CO2: 25 mmol/L (ref 22–32)
Calcium: 9 mg/dL (ref 8.9–10.3)
Chloride: 104 mmol/L (ref 101–111)
Creatinine, Ser: 0.95 mg/dL (ref 0.44–1.00)
GFR calc non Af Amer: 55 mL/min — ABNORMAL LOW (ref >60)
Glucose, Bld: 98 mg/dL (ref 65–99)
POTASSIUM: 3.5 mmol/L (ref 3.5–5.1)
SODIUM: 139 mmol/L (ref 135–145)

## 2017-12-29 LAB — CBC
HEMATOCRIT: 40.4 % (ref 35.0–47.0)
HEMOGLOBIN: 13.2 g/dL (ref 12.0–16.0)
MCH: 28.1 pg (ref 26.0–34.0)
MCHC: 32.7 g/dL (ref 32.0–36.0)
MCV: 86.1 fL (ref 80.0–100.0)
PLATELETS: 166 10*3/uL (ref 150–440)
RBC: 4.69 MIL/uL (ref 3.80–5.20)
RDW: 16 % — ABNORMAL HIGH (ref 11.5–14.5)
WBC: 4.9 10*3/uL (ref 3.6–11.0)

## 2017-12-29 LAB — DIGOXIN LEVEL: Digoxin Level: 0.4 ng/mL — ABNORMAL LOW (ref 0.8–2.0)

## 2017-12-29 LAB — T4, FREE: Free T4: 1.03 ng/dL (ref 0.61–1.12)

## 2017-12-29 MED ORDER — ADULT MULTIVITAMIN W/MINERALS CH
1.0000 | ORAL_TABLET | Freq: Every day | ORAL | Status: DC
  Administered 2017-12-30: 1 via ORAL
  Filled 2017-12-29: qty 1

## 2017-12-29 MED ORDER — ENSURE ENLIVE PO LIQD
237.0000 mL | Freq: Two times a day (BID) | ORAL | Status: DC
  Administered 2017-12-30: 237 mL via ORAL

## 2017-12-29 MED ORDER — LEVOTHYROXINE SODIUM 25 MCG PO TABS
25.0000 ug | ORAL_TABLET | Freq: Every day | ORAL | Status: DC
  Administered 2017-12-29 – 2017-12-30 (×2): 25 ug via ORAL
  Filled 2017-12-29 (×2): qty 1

## 2017-12-29 MED ORDER — HALOPERIDOL LACTATE 5 MG/ML IJ SOLN
1.0000 mg | Freq: Four times a day (QID) | INTRAMUSCULAR | Status: DC | PRN
  Administered 2017-12-29: 1 mg via INTRAMUSCULAR
  Filled 2017-12-29: qty 1

## 2017-12-29 MED ORDER — RISPERIDONE 0.5 MG PO TABS
0.5000 mg | ORAL_TABLET | Freq: Two times a day (BID) | ORAL | Status: DC | PRN
  Administered 2017-12-29: 0.5 mg via ORAL
  Filled 2017-12-29 (×3): qty 1

## 2017-12-29 MED ORDER — METOPROLOL TARTRATE 25 MG PO TABS
25.0000 mg | ORAL_TABLET | Freq: Four times a day (QID) | ORAL | Status: DC
  Administered 2017-12-29 – 2017-12-30 (×3): 25 mg via ORAL
  Filled 2017-12-29 (×6): qty 1

## 2017-12-29 MED ORDER — POTASSIUM CHLORIDE CRYS ER 20 MEQ PO TBCR
40.0000 meq | EXTENDED_RELEASE_TABLET | Freq: Once | ORAL | Status: AC
Start: 2017-12-29 — End: 2017-12-29
  Administered 2017-12-29: 40 meq via ORAL
  Filled 2017-12-29: qty 2

## 2017-12-29 MED ORDER — HALOPERIDOL LACTATE 5 MG/ML IJ SOLN
1.0000 mg | Freq: Four times a day (QID) | INTRAMUSCULAR | Status: DC | PRN
  Administered 2017-12-30: 1 mg via INTRAVENOUS
  Filled 2017-12-29: qty 1

## 2017-12-29 NOTE — Consult Note (Signed)
Cardiology Consultation:   Patient ID: Cheryl CooksRita Steele [facility]; [id number]; [birth year]   Admit date: 12/28/2017 Date of Consult: 12/29/2017  Primary Care Provider: Dale Winters, Charlene, MD Primary Cardiologist: Cheryl Winters   Patient Profile:   Cheryl Winters is a 84 y.o. female with a hx of persistent Afib on Eliquis, chronic systolic CHF, left pleural effusion, bradycardia that improved with stopping of propranolol in 2016, HTN, HLD, dementia, and frequent UTI who was directly admitted from the office on 3/13 with Afib with RVR and acute on chronic systolic CHF at the request of Cheryl Winters.  History of Present Illness:   Cheryl Winters was admitted in 10/2017 with new onset Afib with RVR. She was rate controlled and started on Eliquis. Her home medications were continued in additon to rate controlling medications (metoprolol and IV Cardizem), and as a result she required transfer to the ICU for hypotension. Echo showed an EF of 30-35%, diffuse hypokinesis, moderate to severe mitral regurgitation, severely dilated left atrium, moderately dilated RV with mildly reduced RVSF, moderate pulmonary hypertension with a PASP of 55 mmHg. She was most recently seen by Cheryl Winters on 11/15/17 and continued to note fatigue and dizziness. She was referred to pulmonology for evaluation of her persistent left pleural effusion. She underwent successful ultrasound-guided thoracentesis on 11/17/2017, with approximately 550 mL of clear yellow fluid aspirated. Labs on this fluid showed no growth, protein of 2.2 (transudate), LDH 66, cytology with mixed inflammation and reactive mesothelial cells. There has been considerable difficulty in getting the patient's DOAC approved with her insurance leading to a significant delay on their part. She presented to the office for routine follow up on 3/13 accompanied by 2 daughters.  Family indicated the patient has had worsening cough, shortness of breath, lower extremity swelling, decreased  appetite/early satiety.  Cough was productive of clear to white to yellow sputum. Weight was down 11 pounds compared to her last office visit with us on 11/15/2017 (166-->155 pounds).  She had been compliant with all medications as far as the family knows as the patient lives in an assisted living facility.  She was also dealing with episodes of increased somnolence and will fall asleep mid sentence or while attempting to eat.  Patient denied any chest pain, nausea, vomiting, dizziness, presyncope, or syncope.  Patient does tell me today that "something just does not feel right." History is obtained from the 2 daughters given the patient's underlying dementia.   Upon her admission on 3/13 she was given IV Lasix 20 mg, IV digoxin, and placed on an amiodarone infusion. Her heart rate continues to run in the 110s to 120s bpm, in Afib. CXR showed a moderate to large left pleural effusion with left lower lobe atelectasis. ABG noted. WBC 4.7, HGB 13.6, Na 138, K+ 3.7-->3.5, glucose 128, BUN 27, SCr 0.97, albumin 3.4, TSH 22.490, magnesium 1.9, digoxin 0.4 this morning. BP imoproved into the 120s systolic.     Past Medical History:  Diagnosis Date  . Abdominal pain, right upper quadrant   . Benign essential tremor   . Carpal tunnel syndrome   . Chronic combined systolic and diastolic CHF (congestive heart failure) (HCC)    a. TTE 10/2017: EF 30-35%, diffuse HK, not technically sufficient to allow for LV diastolic function, moderate to severe MR, severely dilated left atrium measuring 54 mm, moderately dilated RV with mild systolic reduction, mildly dilated right atrium, moderate TR, PASP 55 mmHg, moderate left-sided pleural effusion  . Dementia  a. mixed vascular and Alzheimer's  . DJD (degenerative joint disease) of knee   . GERD (gastroesophageal reflux disease)   . History of stress test    a. 10/2015 showed no evidence of ischemia, EF 62%, low risk study  . HTN (hypertension)   . Hyperlipidemia   .  Persistent atrial fibrillation (HCC)    a. diagnosed 10/2017; b. CHADS2VASc => 6 (CHF, HTN, age x 2, vascular disease, female); c. on Eliquis  . Syncope and collapse   . Systolic CHF (HCC)   . Ulnar nerve lesion   . UTI (urinary tract infection)   . Vitamin D deficiency     Past Surgical History:  Procedure Laterality Date  . Bilateral Bletharoplastices    . BREAST BIOPSY  1957 and 1962   x 2, Nml per pt  . BREAST CYST EXCISION     x 2 - nml path per pt  . CARPAL TUNNEL RELEASE  2004  . CATARACT EXTRACTION    . TOTAL KNEE ARTHROPLASTY     bilateral, total, Cheryl Winters  . TOTAL SHOULDER REPLACEMENT  08/2010   left  . TUBAL LIGATION    . UMBILICAL HERNIA REPAIR    . VAGINAL DELIVERY     1 set Twins/2 single births     Home Meds: Prior to Admission medications   Medication Sig Start Date End Date Taking? Authorizing Provider  alum & mag hydroxide-simeth (MAALOX/MYLANTA) 200-200-20 MG/5ML suspension Take 30 mLs by mouth every 6 (six) hours as needed for indigestion or heartburn. 11/04/17   Cheryl Pollack, MD  apixaban (ELIQUIS) 5 MG TABS tablet Take 1 tablet (5 mg total) by mouth 2 (two) times daily. 12/01/17   Cheryl Ouch, MD  furosemide (LASIX) 20 MG tablet Take 1 tablet (20 mg total) by mouth 2 (two) times daily. 11/17/17   Cheryl Ouch, MD  lisinopril (PRINIVIL,ZESTRIL) 2.5 MG tablet Take 1 tablet (2.5 mg total) by mouth daily. 11/09/17 02/07/18  Cheryl Barges, PA-C  metoprolol tartrate (LOPRESSOR) 50 MG tablet Take 1 tablet (50 mg total) by mouth 2 (two) times daily. 11/09/17   Cheryl Winters, Cheryl Mutton, PA-C  potassium chloride (K-DUR,KLOR-CON) 10 MEQ tablet Take 1 tablet (10 mEq total) by mouth 2 (two) times daily. 11/17/17   Cheryl Ouch, MD    Inpatient Medications: Scheduled Meds: . apixaban  5 mg Oral BID  . famotidine  20 mg Oral BID  . furosemide  20 mg Intravenous BID  . potassium chloride  10 mEq Oral BID   Continuous Infusions: . amiodarone 30 mg/hr (12/28/17 2305)     PRN Meds: acetaminophen **OR** acetaminophen, alum & mag hydroxide-simeth, ondansetron **OR** ondansetron (ZOFRAN) IV  Allergies:   Allergies  Allergen Reactions  . Amlodipine Other (See Comments)    malaise malaise malaise  . Amoxicillin-Pot Clavulanate Other (See Comments)    Other reaction(s): Other (See Comments) Cannot remember. Cannot remember.  . Penicillins Other (See Comments)    Has patient had a PCN reaction causing immediate rash, facial/tongue/throat swelling, SOB or lightheadedness with hypotension: Unknown Has patient had a PCN reaction causing severe rash involving mucus membranes or skin necrosis: Unknown Has patient had a PCN reaction that required hospitalization: Unknown Has patient had a PCN reaction occurring within the last 10 years: No If all of the above answers are "NO", then may proceed with Cephalosporin use. Cannot remember.  . Codeine Rash  . Codeine Sulfate Rash    Social History:   Social History  Socioeconomic History  . Marital status: Widowed    Spouse name: Not on file  . Number of children: 4  . Years of education: Not on file  . Highest education level: Not on file  Social Needs  . Financial resource strain: Not hard at all  . Food insecurity - worry: Never true  . Food insecurity - inability: Never true  . Transportation needs - medical: No  . Transportation needs - non-medical: No  Occupational History  . Occupation: Retired - Biomedical scientist  Tobacco Use  . Smoking status: Never Smoker  . Smokeless tobacco: Never Used  Substance and Sexual Activity  . Alcohol use: No  . Drug use: No  . Sexual activity: Not Currently  Other Topics Concern  . Not on file  Social History Narrative   Lives alone in [place]. Widow. Has children who live nearby.     Family History:  Family History  Problem Relation Age of Onset  . Hypertension Mother   . Arthritis Mother   . Stroke Mother   . Dementia Mother   . Early death  Father 60       from brain cancer  . Brain cancer Father 25  . Breast cancer Maternal Aunt   . Alcohol abuse Other     ROS:  Review of Systems  Unable to perform ROS: Dementia      Physical Exam/Data:   Vitals:   12/28/17 1652 12/28/17 1738 12/28/17 1943 12/29/17 0349  BP: 120/85  107/71 122/87  Pulse: (!) 120  68 94  Resp: 18  17 16   Temp: 97.6 F (36.4 C)  97.8 F (36.6 C) 97.6 F (36.4 C)  TempSrc: Oral  Oral Oral  SpO2: (!) 87% 100% 99% 97%  Weight: 154 lb 11.2 oz (70.2 kg)     Height: 4\' 11"  (1.499 m)       Intake/Output Summary (Last 24 hours) at 12/29/2017 0727 Last data filed at 12/29/2017 0611 Gross per 24 hour  Intake 305.05 ml  Output 800 ml  Net -494.95 ml   Filed Weights   12/28/17 1652  Weight: 154 lb 11.2 oz (70.2 kg)   Body mass index is 31.25 kg/m.   Physical Exam: General: Elderly and frail appearing, in no acute distress. Head: Normocephalic, atraumatic, sclera non-icteric, no xanthomas, nares without discharge.  Neck: Negative for carotid bruits. JVD elevated. Lungs: Diminished breath sounds bilaterally with bibasilar crackles. Breathing is unlabored. Heart: Tachycardic, irregularly irregular with S1 S2. III/VI systolic murmur at the apex, no rubs, or gallops appreciated. Abdomen: Soft, non-tender, non-distended with normoactive bowel sounds. No hepatomegaly. No rebound/guarding. No obvious abdominal masses. Msk:  Strength and tone appear normal for age. Extremities: No clubbing or cyanosis. 1+ bilateral pitting edema. Distal pedal pulses are 2+ and equal bilaterally. Neuro: Alert. No facial asymmetry. No focal deficit. Moves all extremities spontaneously. Psych:  Responds to questions with a normal affect.   EKG:  The EKG was personally reviewed and demonstrates: A. fib with RVR, 124 bpm, low voltage QRS, possible prior septal infarct, possible prior inferior infarct, nonspecific ST-T changes Telemetry:  Telemetry was personally reviewed and  demonstrates: Afib with RVR, 110s to 120s bpm  Weights: Filed Weights   12/28/17 1652  Weight: 154 lb 11.2 oz (70.2 kg)    Relevant CV Studies: TTE 10/2017: Study Conclusions  - Left ventricle: The cavity size was normal. Systolic function was   moderately to severely reduced. The estimated ejection fraction   was in  the range of 30% to 35%. Diffuse hypokinesis. Regional   wall motion abnormalities cannot be excluded. The study is not   technically sufficient to allow evaluation of LV diastolic   function. - Mitral valve: There was moderate to severe regurgitation. - Left atrium: The atrium was severely dilated. - Right ventricle: The cavity size was moderately dilated. Wall   thickness was normal. Systolic function was mildly reduced. - Right atrium: The atrium was mildly dilated. - Tricuspid valve: There was moderate regurgitation. - Pulmonary arteries: Systolic pressure was moderately elevated. PA   peak pressure: 55 mm Hg (S).  Impressions:  - Moderate sized left pleural effusion noted.  Laboratory Data:  Chemistry Recent Labs  Lab 12/28/17 1955 12/29/17 0520  NA 138 139  K 3.7 3.5  CL 104 104  CO2 25 25  GLUCOSE 128* 98  BUN 27* 25*  CREATININE 0.97 0.95  CALCIUM 9.1 9.0  GFRNONAA 53* 55*  GFRAA >60 >60  ANIONGAP 9 10    Recent Labs  Lab 12/28/17 1955  PROT 6.4*  ALBUMIN 3.4*  AST 27  ALT 19  ALKPHOS 52  BILITOT 1.2   Hematology Recent Labs  Lab 12/28/17 1955 12/29/17 0520  WBC 4.7 4.9  RBC 4.78 4.69  HGB 13.6 13.2  HCT 40.9 40.4  MCV 85.6 86.1  MCH 28.5 28.1  MCHC 33.3 32.7  RDW 16.1* 16.0*  PLT 162 166   Cardiac EnzymesNo results for input(s): TROPONINI in the last 168 hours. No results for input(s): TROPIPOC in the last 168 hours.  BNPNo results for input(s): BNP, PROBNP in the last 168 hours.  DDimer No results for input(s): DDIMER in the last 168 hours.  Radiology/Studies:  Dg Chest Port 1 View  Result Date:  12/28/2017 IMPRESSION: Moderate to large left pleural effusion with left lower lobe atelectasis. Cardiomegaly. Electronically Signed   By: Charlett Nose M.D.   On: 12/28/2017 19:34    Assessment and Plan:   1. Acute on chronic systolic CHF: -She remains volume overloaded -Continue IV Lasix 20 mg bid with KCl repletion -Add Lopressor as below, consolidate to Toprol when heart rate is better controlled -Lisinopril held upon admission secondary to soft BP, resume when able -Has not been on spironolactone given soft BP, start when able -Daily weights, strict Is and Os -No need to repeat echo given recent study in 10/2017  2. Persistent Afib with RVR: -Ventricular rates remain tachycardic into the 120s bpm -Start lopressor 25 mg q 6 hours for added rate control -Continue amiodarone infusion for rate control -Would ideally like to avoid CCB given her cardiomyopathy -Will try to avoid digoxin given her advanced age and frail state -Continue Eliquis 5 mg bid (does not meet 2/3 reduced dosing criteria) -May benefit from DCCV after she has been adequately loaded with amiodarone  3. Recurrent left pleural effusion: -May benefit from repeat thoracentesis  -Lasix as above  4. Dementia: -Unlikely to be a good candidate for invasive ischemic evaluation of there cardiomyopathy as above.  5. HTN: -BP not as soft -Follow  6. Abnormal TSH: -Needs further workup -Defer to IM  7. Dispo: -Would benefit from Palliative Care consult to discuss GOC   For questions or updates, please contact [facility] Please consult [email or web address] for contact info under Cardiology/STEMI.   Signed, Eula Listen, PA-C [facility] Pager: [phone] 12/29/2017, 7:27 AM

## 2017-12-29 NOTE — Evaluation (Signed)
Physical Therapy Evaluation Patient Details Name: Cheryl Winters MRN: [id number] DOB: 1933/12/15 Today's Date: 12/29/2017   History of Present Illness  Pt admitted for acute on chronic systolic CHF following pt experiencing rapid a-fib.  PMH includes dementia, Vit. D defic., UTI, ulnar nn. lesion, HLD, GERD, DJD, CHF, carpal tunnel, benign essential tumor and pain in R upper abdominal quadrant.  Clinical Impression  Pt is an 84 year old female who has recently moved into [employer].  She is independent with a RW at baseline and demonstrated independence with bed mobility and STS transfer.  PT provided supervision during 20 ft of ambulation with RW to move obstacles in hospital room.  Pt was resistant to PT education and repeated that she did not understand why she had to do a PT evaluation when "I have been moving fine my whole life".  Pt's daughter was present during evaluation to answer questions.  Pt is not a candidate for PT at this time due to functioning at baseline level of mobility and cognitive status.    Follow Up Recommendations No PT follow up    Equipment Recommendations       Recommendations for Other Services       Precautions / Restrictions Precautions Precautions: Fall Restrictions Weight Bearing Restrictions: No      Mobility  Bed Mobility Overal bed mobility: Independent                Transfers Overall transfer level: Modified independent Equipment used: Rolling walker (2 wheeled)             General transfer comment: Pt able to stand with safe use of RW.  Required no VC's for hand placement.  Ambulation/Gait Ambulation/Gait assistance: Supervision Ambulation Distance (Feet): 20 Feet Assistive device: Rolling walker (2 wheeled)     Gait velocity interpretation: at or above normal speed for age/gender General Gait Details: Pt ambulated 20 ft around bed to recliner with RW, requiring supervision for management of obstacles and precautions  due to pt being hospitalized.  Pt presented with moderate foot clearance and maintains a flexed posture when using RW, though she does keep RW in close proximity to body.  Stairs            Wheelchair Mobility    Modified Rankin (Stroke Patients Only)       Balance Overall balance assessment: Modified Independent                                           Pertinent Vitals/Pain Pain Assessment: No/denies pain    Home Living Family/patient expects to be discharged to:: Assisted living               Home Equipment: Dan Humphreys - 2 wheels Additional Comments: Pt's daughter available throughout evaluation to answer questions.    Prior Function Level of Independence: Needs assistance   Gait / Transfers Assistance Needed: Pt uses RW to ambulate in hallway at ALF.  ADL's / Homemaking Assistance Needed: Pt bathes and dresses herself but daughter is concerned that she is not bathing frequently enough.        Hand Dominance        Extremity/Trunk Assessment   Upper Extremity Assessment Upper Extremity Assessment: Generalized weakness    Lower Extremity Assessment Lower Extremity Assessment: Overall WFL for tasks assessed    Cervical / Trunk Assessment Cervical / Trunk  Assessment: Kyphotic  Communication   Communication: No difficulties  Cognition Arousal/Alertness: Awake/alert Behavior During Therapy: Restless Overall Cognitive Status: History of cognitive impairments - at baseline                                 General Comments: Pt has a dx of dementia and resisted PT throughout evaluation, stating that she doesn't understand why she would need to be evaluated.  PT educated pt concerning importance of PT evaluation after being hospitalized throughout evaluation.      General Comments      Exercises     Assessment/Plan    PT Assessment Patent does not need any further PT services  PT Problem List         PT Treatment  Interventions      PT Goals (Current goals can be found in the Care Plan section)  Acute Rehab PT Goals Patient Stated Goal: To return to a program that she participated in previously at [facility] so that she can remain active. PT Goal Formulation: With patient/family Time For Goal Achievement: 01/12/18 Potential to Achieve Goals: Good    Frequency     Barriers to discharge        Co-evaluation               AM-PAC PT "6 Clicks" Daily Activity  Outcome Measure Difficulty turning over in bed (including adjusting bedclothes, sheets and blankets)?: None Difficulty moving from lying on back to sitting on the side of the bed? : None Difficulty sitting down on and standing up from a chair with arms (e.g., wheelchair, bedside commode, etc,.)?: None Help needed moving to and from a bed to chair (including a wheelchair)?: A Little Help needed walking in hospital room?: A Little Help needed climbing 3-5 steps with a railing? : A Little 6 Click Score: 21    End of Session Equipment Utilized During Treatment: Gait belt;Oxygen Activity Tolerance: Patient tolerated treatment well;Treatment limited secondary to agitation Patient left: in chair;with call bell/phone within reach;with chair alarm set Nurse Communication: Mobility status PT Visit Diagnosis: Muscle weakness (generalized) (M62.81)    Time: [id number] PT Time Calculation (min) (ACUTE ONLY): 30 min   Charges:   PT Evaluation $PT Eval Low Complexity: 1 Low     PT G Codes:   PT G-Codes **NOT FOR INPATIENT CLASS** Functional Assessment Tool Used: AM-PAC 6 Clicks Basic Mobility    Glenetta HewSarah Kellee Sittner, PT, DPT   Glenetta HewSarah Tauna Macfarlane 12/29/2017, 9:09 AM

## 2017-12-29 NOTE — Consult Note (Signed)
Consultation Note Date: 12/29/2017   Patient Name: Cheryl Winters  DOB: 11-25-1933  MRN: [id number]  Age / Sex: 84 y.o., female  PCP: Dale DurhamScott, Charlene, MD Referring Physician: Adrian SaranMody, Sital, MD  Reason for Consultation: Establishing goals of care  HPI/Patient Profile: Cheryl Winters  is a 84 y.o. female with a known history of atrial fibrillation, pleural effusion and congestive heart failure.  She was seen in cardiology office yesterday and found in rapid atrial fibrillation. She recently had a thoracentesis removing fluid off the lung in January.  Patient has a history of dementia.     Clinical Assessment and Goals of Care: Patient sitting in bedside chair. She is confused, does not know where she is and perseverates on bank accounts and property for sale even with refocusing conversation. Spoke with her daughter Rory PercyJayme, [place]. She states her father died in [id number] and she has enjoyed living alone. She was driving until January though it scared the family.  As of 1 month ago, her mother moved into an assisted living facility. She states she tried to keep her at home, but it did not work for the family and was cost prohibitive. She tells me [name] is supposed to use a walker optimally, but at least a cane, however her mother does not want to use a device. She will not use hearing aids or any other equipment and states her mother refuses to realize she is aging.     She states she has 2 sisters with which there are family dynamic issues; she tells me one is difficult to reach, and another who was recently removed as POA.   We discussed diagnosis, prognosis, GOC, EOL wishes disposition and options.    Concepts specific to code status, artifical feeding and hydration, IV antibiotics and rehospitalization was had.  The difference between an aggressive medical intervention path  and a comfort care path for this patient at this  time was discussed.  Values and goals of care important to patient and family were attempted to be elicited. Natural trajectory and expectations at EOL were discussed.   She states she understands her mother's dementia cannot be reversed and is progressive. Jayme tells me her mother had always told her to do what she could. She also tells me sometimes she does not want to take medications and asks for food, but does not want to eat it once it is in front of her, and has forgotten she asked for it.    Jayme tells me she needs to try to speak with her sisters to discuss options and plans, and may set up a meeting with our team tomorrow to discuss further. Phone number provided.      SUMMARY OF RECOMMENDATIONS    POA is daughter Rory PercyJayme. GOC discussed, understands prognosis and leaning toward hospice but wants to talk with family first.    Code Status/Advance Care Planning  Full code    Symptom Management:   Per primary team.   Palliative Prophylaxis:   Eye Care and  Oral Care   Prognosis:  < 6 months  CHF EF 30-35%. Thoracentesis in January, likely thoracentesis this hospitalization. A fib with RVR on amio drip. Dementia  Discharge Planning: To Be Determined      Primary Diagnoses: Present on Admission: . Acute on chronic systolic CHF (congestive heart failure), NYHA class 3 (HCC) . Atrial fibrillation with RVR (HCC) . Dilated cardiomyopathy (HCC) . Pleural effusion . Dementia . Hypertension . Somnolence   I have reviewed the medical record, interviewed the patient and family, and examined the patient. The following aspects are pertinent.  Past Medical History:  Diagnosis Date  . Abdominal pain, right upper quadrant   . Benign essential tremor   . Carpal tunnel syndrome   . Chronic combined systolic and diastolic CHF (congestive heart failure) (HCC)    a. TTE 10/2017: EF 30-35%, diffuse HK, not technically sufficient to allow for LV diastolic function, moderate to severe  MR, severely dilated left atrium measuring 54 mm, moderately dilated RV with mild systolic reduction, mildly dilated right atrium, moderate TR, PASP 55 mmHg, moderate left-sided pleural effusion  . Dementia    a. mixed vascular and Alzheimer's  . DJD (degenerative joint disease) of knee   . GERD (gastroesophageal reflux disease)   . History of stress test    a. 10/2015 showed no evidence of ischemia, EF 62%, low risk study  . HTN (hypertension)   . Hyperlipidemia   . Persistent atrial fibrillation (HCC)    a. diagnosed 10/2017; b. CHADS2VASc => 6 (CHF, HTN, age x 2, vascular disease, female); c. on Eliquis  . Syncope and collapse   . Systolic CHF (HCC)   . Ulnar nerve lesion   . UTI (urinary tract infection)   . Vitamin D deficiency    Social History   Socioeconomic History  . Marital status: Widowed    Spouse name: None  . Number of children: 4  . Years of education: None  . Highest education level: None  Social Needs  . Financial resource strain: Not hard at all  . Food insecurity - worry: Never true  . Food insecurity - inability: Never true  . Transportation needs - medical: No  . Transportation needs - non-medical: No  Occupational History  . Occupation: Retired - Biomedical scientist  Tobacco Use  . Smoking status: Never Smoker  . Smokeless tobacco: Never Used  Substance and Sexual Activity  . Alcohol use: No  . Drug use: No  . Sexual activity: Not Currently  Other Topics Concern  . None  Social History Narrative   Lives alone in [place]. Widow. Has children who live nearby.   Family History  Problem Relation Age of Onset  . Hypertension Mother   . Arthritis Mother   . Stroke Mother   . Dementia Mother   . Early death Father 97       from brain cancer  . Brain cancer Father 30  . Breast cancer Maternal Aunt   . Alcohol abuse Other    Scheduled Meds: . apixaban  5 mg Oral BID  . famotidine  20 mg Oral BID  . furosemide  20 mg Intravenous BID  .  metoprolol tartrate  25 mg Oral Q6H  . potassium chloride  10 mEq Oral BID   Continuous Infusions: . amiodarone 30 mg/hr (12/29/17 1103)   PRN Meds:.acetaminophen **OR** acetaminophen, alum & mag hydroxide-simeth, ondansetron **OR** ondansetron (ZOFRAN) IV Medications Prior to Admission:  Prior to Admission medications   Medication Sig Start  Date End Date Taking? Authorizing Provider  alum & mag hydroxide-simeth (MAALOX/MYLANTA) 200-200-20 MG/5ML suspension Take 30 mLs by mouth every 6 (six) hours as needed for indigestion or heartburn. 11/04/17   Shaune Pollack, MD  apixaban (ELIQUIS) 5 MG TABS tablet Take 1 tablet (5 mg total) by mouth 2 (two) times daily. 12/01/17   Iran Ouch, MD  furosemide (LASIX) 20 MG tablet Take 1 tablet (20 mg total) by mouth 2 (two) times daily. 11/17/17   Iran Ouch, MD  lisinopril (PRINIVIL,ZESTRIL) 2.5 MG tablet Take 1 tablet (2.5 mg total) by mouth daily. 11/09/17 02/07/18  Sondra Barges, PA-C  metoprolol tartrate (LOPRESSOR) 50 MG tablet Take 1 tablet (50 mg total) by mouth 2 (two) times daily. 11/09/17   Dunn, Raymon Mutton, PA-C  potassium chloride (K-DUR,KLOR-CON) 10 MEQ tablet Take 1 tablet (10 mEq total) by mouth 2 (two) times daily. 11/17/17   Iran Ouch, MD   Allergies  Allergen Reactions  . Amlodipine Other (See Comments)    malaise malaise malaise  . Amoxicillin-Pot Clavulanate Other (See Comments)    Other reaction(s): Other (See Comments) Cannot remember. Cannot remember.  . Penicillins Other (See Comments)    Has patient had a PCN reaction causing immediate rash, facial/tongue/throat swelling, SOB or lightheadedness with hypotension: Unknown Has patient had a PCN reaction causing severe rash involving mucus membranes or skin necrosis: Unknown Has patient had a PCN reaction that required hospitalization: Unknown Has patient had a PCN reaction occurring within the last 10 years: No If all of the above answers are "NO", then may proceed with  Cephalosporin use. Cannot remember.  . Codeine Rash  . Codeine Sulfate Rash   Review of Systems  All other systems reviewed and are negative.   Physical Exam  Constitutional: No distress.  Pulmonary/Chest: Effort normal.  Neurological: She is alert.  Confused    Vital Signs: BP 121/80 (BP Location: Left Arm)   Pulse 91   Temp 97.9 F (36.6 C) (Oral)   Resp 17   Ht 4\' 11"  (1.499 m)   Wt 70.2 kg (154 lb 11.2 oz)   SpO2 97%   BMI 31.25 kg/m  Pain Assessment: No/denies pain       SpO2: SpO2: 97 % O2 Device:SpO2: 97 % O2 Flow Rate: .O2 Flow Rate (L/min): 2 L/min  IO: Intake/output summary:   Intake/Output Summary (Last 24 hours) at 12/29/2017 1239 Last data filed at 12/29/2017 1008 Gross per 24 hour  Intake 545.05 ml  Output 800 ml  Net -254.95 ml    LBM: Last BM Date: 12/25/17 Baseline Weight: Weight: 70.2 kg (154 lb 11.2 oz) Most recent weight: Weight: 70.2 kg (154 lb 11.2 oz)     Palliative Assessment/Data: 50%     Time In: 12:20 Time Out: 1:10 Time Total: 50 min Greater than 50%  of this time was spent counseling and coordinating care related to the above assessment and plan.  Signed by: Morton Stall, NP   Please contact Palliative Medicine Team phone at [phone] for questions and concerns.  For individual provider: See Loretha Stapler

## 2017-12-29 NOTE — Progress Notes (Signed)
Family Meeting Note  Advance Directive:yes  Today a meeting took place with the family.  Patient is unable to participate due ZO:XWRUEAto:Lacked capacity dementia   The following clinical team members were present during this meeting:MD  The following were discussed:Patient's diagnosis: CHF systolic acute Dementia Persistent A fib  Recurrent pleural effusion    Patient's progosis: Unable to determine and Goals for treatment: Full Code  Additional follow-up to be provided: palliative Care consult for goals of care 2 daughters who are POA will need to be present for  Meeting with PC    Time spent during discussion:16 minutes  Heath Badon, MD

## 2017-12-29 NOTE — Care Management (Signed)
Palliative consult is pending.  Currently on amiodarone drip and may require thoracentesis. Previous procedure in jan 2019.  Updated CSW that it has been reported patient is from [facility]

## 2017-12-29 NOTE — Progress Notes (Signed)
Pt is very confused, agitated, and combative/ pt has pulled out her IV and refusing to wear tele monitor/ MD paged/ orders given for haldol/ will continue to monitor closely/ family at bedside

## 2017-12-29 NOTE — Progress Notes (Signed)
[facility] [place] [facility]   PATIENT NAME: Cheryl SchlatterRita Eroh    MR#:  [id number]  DATE OF BIRTH:  [birth year]  SUBJECTIVE:   Patient with dementia    REVIEW OF SYSTEMS:    dementia  Tolerating Diet: yes      DRUG ALLERGIES:   Allergies  Allergen Reactions  . Amlodipine Other (See Comments)    malaise malaise malaise  . Amoxicillin-Pot Clavulanate Other (See Comments)    Other reaction(s): Other (See Comments) Cannot remember. Cannot remember.  . Penicillins Other (See Comments)    Has patient had a PCN reaction causing immediate rash, facial/tongue/throat swelling, SOB or lightheadedness with hypotension: Unknown Has patient had a PCN reaction causing severe rash involving mucus membranes or skin necrosis: Unknown Has patient had a PCN reaction that required hospitalization: Unknown Has patient had a PCN reaction occurring within the last 10 years: No If all of the above answers are "NO", then may proceed with Cephalosporin use. Cannot remember.  . Codeine Rash  . Codeine Sulfate Rash    VITALS:  Blood pressure 121/80, pulse 91, temperature 97.9 F (36.6 C), temperature source Oral, resp. rate 17, height 4\' 11"  (1.499 m), weight 70.2 kg (154 lb 11.2 oz), SpO2 98 %.  PHYSICAL EXAMINATION:  Constitutional: Appears well-developed and well-nourished. No distress. HENT: Normocephalic. Marland Kitchen. Oropharynx is clear and moist.  Eyes: Conjunctivae and EOM are normal. PERRLA, no scleral icterus.  Neck: Normal ROM. Neck supple. + JVD. No tracheal deviation. CVS: tachy irr, irr 2/6 SEM no gallops, no carotid bruit.  Pulmonary: Normal respiratory effort with decreased breath sounds left base  Abdominal: Soft. BS +,  no distension, tenderness, rebound or guarding.  Musculoskeletal: Normal range of motion. 1+ LLE no tenderness.  Neuro: Alert. CN 2-12 grossly intact. No focal deficits. Skin: Skin is warm and dry. No rash noted. Psychiatric:  Dementia     LABORATORY PANEL:   CBC Recent Labs  Lab 12/29/17 0520  WBC 4.9  HGB 13.2  HCT 40.4  PLT 166   ------------------------------------------------------------------------------------------------------------------  Chemistries  Recent Labs  Lab 12/28/17 1955 12/29/17 0520  NA 138 139  K 3.7 3.5  CL 104 104  CO2 25 25  GLUCOSE 128* 98  BUN 27* 25*  CREATININE 0.97 0.95  CALCIUM 9.1 9.0  MG 1.9  --   AST 27  --   ALT 19  --   ALKPHOS 52  --   BILITOT 1.2  --    ------------------------------------------------------------------------------------------------------------------  Cardiac Enzymes No results for input(s): TROPONINI in the last 168 hours. ------------------------------------------------------------------------------------------------------------------  RADIOLOGY:  Dg Chest Port 1 View  Result Date: 12/28/2017 CLINICAL DATA:  Chronic systolic CHF. Atrial fibrillation. Left pleural effusion. EXAM: PORTABLE CHEST 1 VIEW COMPARISON:  11/17/2017 FINDINGS: Cardiomegaly. Moderate to large left pleural effusion with left lower lobe atelectasis. Right lung clear. No overt edema. No acute bony abnormality. IMPRESSION: Moderate to large left pleural effusion with left lower lobe atelectasis. Cardiomegaly. Electronically Signed   By: Charlett NoseKevin  Dover M.D.   On: 12/28/2017 19:34     ASSESSMENT AND PLAN:   84 year old female with a history of atrial fibrillation, chronic systolic heart failure ejection fraction 30-35% and moderate pulmonary hypertension who presented from cardiologist's office due to rapid atrial fibrillation and increased lower extremity edema.  1. Acute on chronic systolic heart failure ejection fraction 30-35%: Continue IV Lasix with potassium repletion when necessary Continue to monitor intake and output with daily weight CHF clinic upon discharge  Patient has recurrent left pleural effusion Chest x-ray planned for tomorrow and if there is  still a large pleural effusion then patient will need to undergo repeat thoracentesis.   2. Persistent atrial fibrillation with RVR: Continue Lopressor Continue amiodarone gtt, with plan to change oral tomorrow Continue Eliquis Try to avoid digoxin as per cardiology  3. Dementia:  4. Essential hypertension: Continue metoprolol    Discussed with cardiology    Management plans discussed with the patient's daughter and she is in agreement.  CODE STATUS: Full  TOTAL TIME TAKING CARE OF THIS PATIENT: 30 minutes.     POSSIBLE D/C 2 days, DEPENDING ON CLINICAL CONDITION.   Jyden Kromer M.D on 12/29/2017 at 8:41 AM  Between 7am to 6pm - Pager - [phone] After 6pm go to [email or web address] - password [employer]  [facility]  Office  [phone]  CC: Primary care physician; Dale , MD  Note: This dictation was prepared with Dragon dictation along with smaller phrase technology. Any transcriptional errors that result from this process are unintentional.

## 2017-12-29 NOTE — Plan of Care (Signed)
Patient is impulsive and ignore instructions for using call bell due to dementia. Increase rounding.

## 2017-12-29 NOTE — Progress Notes (Signed)
Initial Nutrition Assessment  DOCUMENTATION CODES:   Obesity unspecified  INTERVENTION:   Ensure Enlive po BID, each supplement provides 350 kcal and 20 grams of protein  MVI daily  Liberalize diet  NUTRITION DIAGNOSIS:   Inadequate oral intake related to acute illness as evidenced by per patient/family report.  GOAL:   Patient will meet greater than or equal to 90% of their needs  MONITOR:   PO intake, Supplement acceptance, Weight trends, Labs, I & O's, Skin  REASON FOR ASSESSMENT:   Malnutrition Screening Tool    ASSESSMENT:   84 y.o. female with history of persistent Afib on Eliquis, chronic systolic CHF, left pleural effusion, bradycardia that improved with stopping of propranolol in 2016, HTN, HLD, dementia, and frequent UTI who presents for follow up of her Afib.    Unable to see pt today. Pt with AMS. Per chart review, pt's family reports poor appetite and oral intake pta. Family reports that pt will ask for food but then not eat it once its in front of her. Per chart, pt has lost 12lbs(7%) over the past 6 weeks; this is significant given the time frame. Pt documented to have eaten 75% of her breakfast this morning. RD will add supplements and liberalize diet as a heart healthy diet restricts protein also. Palliative care consult today; pt leaning towards hospice but awaiting discussions with other family members.   Medications reviewed and include: pepcid, lasix, KCl  Labs reviewed: BUN 25(H)  Unable to complete Nutrition-Focused physical exam at this time.   Diet Order:  Diet NPO time specified Except for: Sips with Meds Diet 2 gram sodium Room service appropriate? Yes; Fluid consistency: Thin  EDUCATION NEEDS:   Not appropriate for education at this time  Skin: Reviewed RN Assessment  Last BM:  3/10  Height:   Ht Readings from Last 1 Encounters:  12/28/17 4\' 11"  (1.499 m)    Weight:   Wt Readings from Last 1 Encounters:  12/28/17 154 lb 11.2  oz (70.2 kg)    Ideal Body Weight:  44.5 kg  BMI:  Body mass index is 31.25 kg/m.  Estimated Nutritional Needs:   Kcal:  1200-1400kcal/day   Protein:  70-77g/day   Fluid:  per MD  Betsey Holidayasey Auriel Kist MS, RD, LDN Pager #[phone] After Hours Pager: [phone].

## 2017-12-30 ENCOUNTER — Telehealth: Payer: Self-pay | Admitting: Physician Assistant

## 2017-12-30 ENCOUNTER — Inpatient Hospital Stay: Payer: Medicare Other

## 2017-12-30 ENCOUNTER — Telehealth: Payer: Self-pay | Admitting: Cardiovascular Disease

## 2017-12-30 ENCOUNTER — Telehealth: Payer: Self-pay | Admitting: *Deleted

## 2017-12-30 LAB — BASIC METABOLIC PANEL
ANION GAP: 16 — AB (ref 5–15)
BUN: 25 mg/dL — ABNORMAL HIGH (ref 6–20)
CHLORIDE: 98 mmol/L — AB (ref 101–111)
CO2: 23 mmol/L (ref 22–32)
Calcium: 9.5 mg/dL (ref 8.9–10.3)
Creatinine, Ser: 1.3 mg/dL — ABNORMAL HIGH (ref 0.44–1.00)
GFR calc non Af Amer: 37 mL/min — ABNORMAL LOW (ref >60)
GFR, EST AFRICAN AMERICAN: 43 mL/min — AB (ref >60)
GLUCOSE: 156 mg/dL — AB (ref 65–99)
POTASSIUM: 4.5 mmol/L (ref 3.5–5.1)
Sodium: 137 mmol/L (ref 135–145)

## 2017-12-30 MED ORDER — AMIODARONE HCL 200 MG PO TABS
400.0000 mg | ORAL_TABLET | Freq: Two times a day (BID) | ORAL | Status: DC
  Administered 2017-12-30: 400 mg via ORAL
  Filled 2017-12-30: qty 2

## 2017-12-30 MED ORDER — FUROSEMIDE 20 MG PO TABS: 20.0000 mg | ORAL_TABLET | Freq: Two times a day (BID) | ORAL | 0 refills | Status: DC

## 2017-12-30 MED ORDER — RISPERIDONE 0.5 MG PO TABS: 0.5000 mg | ORAL_TABLET | Freq: Two times a day (BID) | ORAL | 0 refills | Status: AC | PRN

## 2017-12-30 MED ORDER — ENSURE ENLIVE PO LIQD: 237.0000 mL | Freq: Two times a day (BID) | ORAL | 12 refills | Status: AC

## 2017-12-30 MED ORDER — AMIODARONE HCL 200 MG PO TABS: 400.0000 mg | ORAL_TABLET | Freq: Two times a day (BID) | ORAL | 0 refills | Status: DC

## 2017-12-30 MED ORDER — METOPROLOL TARTRATE 50 MG PO TABS
50.0000 mg | ORAL_TABLET | Freq: Two times a day (BID) | ORAL | Status: DC
  Administered 2017-12-30: 50 mg via ORAL
  Filled 2017-12-30: qty 1

## 2017-12-30 NOTE — Procedures (Signed)
PROCEDURE SUMMARY:  Successful US guided left thoracentesis. Yielded 450 mL of clear yellow fluid. Pt tolerated procedure well. No immediate complications.   Post procedure chest X-ray reveals no pneumothorax  WENDY S BLAIR PA-C 12/30/2017 11:59 AM

## 2017-12-30 NOTE — Telephone Encounter (Signed)
Transition Care Management Follow-up Telephone Call  How have you been since you were released from the hospital? So far patient is doing well as of this time.   Do you understand why you were in the hospital? yes   Do you understand the discharge instrcutions? yes  Items Reviewed:  Medications reviewed: yes  Allergies reviewed: yes  Dietary changes reviewed: yes  Referrals reviewed: yes   Functional Questionnaire:   Activities of Daily Living (ADLs):   She states they are independent in the following: daughter staed patient has changed she cannot do for herself at all. States they require assistance with the following: ambulation, bathing and hygiene, feeding, continence, grooming, toileting and dressing   Any transportation issues/concerns?: no   Any patient concerns? Yes, daughter feels patient needs memory care.   Confirmed importance and date/time of follow-up visits scheduled: yes   Confirmed with patient if condition begins to worsen call PCP or go to the ER.  Patient was given the Call-a-Nurse line [phone]: Yes.

## 2017-12-30 NOTE — Telephone Encounter (Signed)
Patient still currently admitted- she will be a TCM call for Monday 01/02/18.  Presumably she will be going back to [facility]. Phone: [phone]

## 2017-12-30 NOTE — Telephone Encounter (Signed)
Copied from CRM [id number]. Topic: Appointment Scheduling - Scheduling Inquiry for Clinic >> Dec 30, 2017 10:35 AM Diana EvesHoyt, Maryann B wrote: Reason for CRM: pt needing hospital follow up in 1 week with Dr. Lorin PicketScott. Being discharged today 12/30/17. Diagnosis : afib with rbr

## 2017-12-30 NOTE — Discharge Summary (Addendum)
[facility] [place] at [facility]   PATIENT NAME: Rosalyn Archambault    MR#:  [id number]  DATE OF BIRTH:  11-15-1933  DATE OF ADMISSION:  12/28/2017 ADMITTING PHYSICIAN: Ihor Austin, MD  DATE OF DISCHARGE: 12/30/2017  PRIMARY CARE PHYSICIAN: Dale [place], MD    ADMISSION DIAGNOSIS:  A fib with RVR  DISCHARGE DIAGNOSIS:  Principal Problem:   Acute on chronic systolic CHF (congestive heart failure), NYHA class 3 (HCC) Active Problems:   Hypertension   Atrial fibrillation with RVR (HCC)   Pleural effusion   Dilated cardiomyopathy (HCC)   Dementia   Somnolence   SECONDARY DIAGNOSIS:   Past Medical History:  Diagnosis Date  . Abdominal pain, right upper quadrant   . Benign essential tremor   . Carpal tunnel syndrome   . Chronic combined systolic and diastolic CHF (congestive heart failure) (HCC)    a. TTE 10/2017: EF 30-35%, diffuse HK, not technically sufficient to allow for LV diastolic function, moderate to severe MR, severely dilated left atrium measuring 54 mm, moderately dilated RV with mild systolic reduction, mildly dilated right atrium, moderate TR, PASP 55 mmHg, moderate left-sided pleural effusion  . Dementia    a. mixed vascular and Alzheimer's  . DJD (degenerative joint disease) of knee   . GERD (gastroesophageal reflux disease)   . History of stress test    a. 10/2015 showed no evidence of ischemia, EF 62%, low risk study  . HTN (hypertension)   . Hyperlipidemia   . Persistent atrial fibrillation (HCC)    a. diagnosed 10/2017; b. CHADS2VASc => 6 (CHF, HTN, age x 2, vascular disease, female); c. on Eliquis  . Syncope and collapse   . Systolic CHF (HCC)   . Ulnar nerve lesion   . UTI (urinary tract infection)   . Vitamin D deficiency     HOSPITAL COURSE:  84 year old female with a history of atrial fibrillation, chronic systolic heart failure ejection fraction 30-35% and moderate pulmonary hypertension who presented from cardiologist's office  due to rapid atrial fibrillation and increased lower extremity edema.  1. Acute on chronic systolic heart failure ejection fraction 30-35%: Patient is Euvolemic She will resume home dose of Lasix Continue Metoprolol  CHF clinic upon discharge  2. Persistent atrial fibrillation with RVR: Continue Metoprolol and she was started on AMIODarone She will continue Eliquis Try to avoid digoxin as per cardiology  3. Dementia: Continue PRN Risperdal  4. Essential hypertension: Continue metoprolol  5.Pleural effusion, left: She underwent thoracentesis before discharge with 450 mL removed.  6.  Elevated TSH with normal T4 Patient will benefit from outpatient repeat TSH  Outpatient Palliative care consult    DISCHARGE CONDITIONS AND DIET:   Stable Cardiac diet  CONSULTS OBTAINED:  Treatment Team:  Antonieta Iba, MD  DRUG ALLERGIES:   Allergies  Allergen Reactions  . Amlodipine Other (See Comments)    malaise malaise malaise  . Amoxicillin-Pot Clavulanate Other (See Comments)    Other reaction(s): Other (See Comments) Cannot remember. Cannot remember.  . Penicillins Other (See Comments)    Has patient had a PCN reaction causing immediate rash, facial/tongue/throat swelling, SOB or lightheadedness with hypotension: Unknown Has patient had a PCN reaction causing severe rash involving mucus membranes or skin necrosis: Unknown Has patient had a PCN reaction that required hospitalization: Unknown Has patient had a PCN reaction occurring within the last 10 years: No If all of the above answers are "NO", then may proceed with Cephalosporin use. Cannot remember.  Marland Kitchen  Codeine Rash  . Codeine Sulfate Rash    DISCHARGE MEDICATIONS:   Allergies as of 12/30/2017      Reactions   Amlodipine Other (See Comments)   malaise malaise malaise   Amoxicillin-pot Clavulanate Other (See Comments)   Other reaction(s): Other (See Comments) Cannot remember. Cannot remember.    Penicillins Other (See Comments)   Has patient had a PCN reaction causing immediate rash, facial/tongue/throat swelling, SOB or lightheadedness with hypotension: Unknown Has patient had a PCN reaction causing severe rash involving mucus membranes or skin necrosis: Unknown Has patient had a PCN reaction that required hospitalization: Unknown Has patient had a PCN reaction occurring within the last 10 years: No If all of the above answers are "NO", then may proceed with Cephalosporin use. Cannot remember.   Codeine Rash   Codeine Sulfate Rash      Medication List    STOP taking these medications   lisinopril 2.5 MG tablet Commonly known as:  PRINIVIL,ZESTRIL     TAKE these medications   alum & mag hydroxide-simeth 200-200-20 MG/5ML suspension Commonly known as:  MAALOX/MYLANTA Take 30 mLs by mouth every 6 (six) hours as needed for indigestion or heartburn.   amiodarone 200 MG tablet Commonly known as:  PACERONE Take 2 tablets (400 mg total) by mouth 2 (two) times daily. 400 mg twice a day for 1 week then 200 mg twice daily   apixaban 5 MG Tabs tablet Commonly known as:  ELIQUIS Take 1 tablet (5 mg total) by mouth 2 (two) times daily.   feeding supplement (ENSURE ENLIVE) Liqd Take 237 mLs by mouth 2 (two) times daily between meals.   furosemide 20 MG tablet Commonly known as:  LASIX Take 1 tablet (20 mg total) by mouth 2 (two) times daily. 40 mg in am and 20 mg at 1400 daily What changed:  additional instructions   metoprolol tartrate 50 MG tablet Commonly known as:  LOPRESSOR Take 1 tablet (50 mg total) by mouth 2 (two) times daily.   potassium chloride 10 MEQ tablet Commonly known as:  K-DUR,KLOR-CON Take 1 tablet (10 mEq total) by mouth 2 (two) times daily.   risperiDONE 0.5 MG tablet Commonly known as:  RISPERDAL Take 1 tablet (0.5 mg total) by mouth 2 (two) times daily as needed (aggitation).         Today   CHIEF COMPLAINT:   patient with some agitation  yesterday   VITAL SIGNS:  Blood pressure (!) 88/62, pulse 82, temperature 98.3 F (36.8 C), resp. rate 18, height 4\' 11"  (1.499 m), weight 70.1 kg (154 lb 9.6 oz), SpO2 96 %.   REVIEW OF SYSTEMS:  Review of Systems  Unable to perform ROS: Dementia     PHYSICAL EXAMINATION:  GENERAL:  84 y.o.-year-old patient lying in the bed with no acute distress.  NECK:  Supple, no jugular venous distention. No thyroid enlargement, no tenderness.  LUNGS: Normal breath sounds bilaterally, no wheezing, rales,rhonchi  No use of accessory muscles of respiration.  CARDIOVASCULAR: S1, S2 normal. No murmurs, rubs, or gallops.  ABDOMEN: Soft, non-tender, non-distended. Bowel sounds present. No organomegaly or mass.  EXTREMITIES: No pedal edema, cyanosis, or clubbing.  PSYCHIATRIC: The patient is alert and oriented x name not place time  SKIN: No obvious rash, lesion, or ulcer.   DATA REVIEW:   CBC Recent Labs  Lab 12/29/17 0520  WBC 4.9  HGB 13.2  HCT 40.4  PLT 166    Chemistries  Recent Labs  Lab 12/28/17  1955  12/30/17 0434  NA 138   < > 137  K 3.7   < > 4.5  CL 104   < > 98*  CO2 25   < > 23  GLUCOSE 128*   < > 156*  BUN 27*   < > 25*  CREATININE 0.97   < > 1.30*  CALCIUM 9.1   < > 9.5  MG 1.9  --   --   AST 27  --   --   ALT 19  --   --   ALKPHOS 52  --   --   BILITOT 1.2  --   --    < > = values in this interval not displayed.    Cardiac Enzymes No results for input(s): TROPONINI in the last 168 hours.  Microbiology Results  @MICRORSLT48 @  RADIOLOGY:  Dg Chest 1 View  Result Date: 12/30/2017 CLINICAL DATA:  Left thoracentesis with removal of 450 mL fluid. EXAM: CHEST  1 VIEW COMPARISON:  One-view chest x-ray from the same day at 6 a.m. FINDINGS: Heart is enlarged. Atherosclerotic calcifications are again seen at the aortic arch. There is a significant decrease in the left pleural effusion. Retrocardiac opacity is again noted. There is no pneumothorax. The right lung  is clear. Left shoulder replacement is noted. IMPRESSION: 1. Significant decrease in left pleural effusion without pneumothorax. 2.  Aortic Atherosclerosis (ICD10-I70.0). Electronically Signed   By: Marin Roberts M.D.   On: 12/30/2017 11:23   Dg Chest Port 1 View  Result Date: 12/30/2017 CLINICAL DATA:  Shortness of Breath EXAM: PORTABLE CHEST 1 VIEW COMPARISON:  12/28/2017 FINDINGS: Cardiac shadow is stable. Aortic calcifications are again seen. Left-sided pleural effusion is again noted with likely underlying atelectasis. The right lung remains clear. Postsurgical changes in left shoulder are noted. IMPRESSION: Stable left pleural effusion. Electronically Signed   By: Alcide Clever M.D.   On: 12/30/2017 08:56   Dg Chest Port 1 View  Result Date: 12/28/2017 CLINICAL DATA:  Chronic systolic CHF. Atrial fibrillation. Left pleural effusion. EXAM: PORTABLE CHEST 1 VIEW COMPARISON:  11/17/2017 FINDINGS: Cardiomegaly. Moderate to large left pleural effusion with left lower lobe atelectasis. Right lung clear. No overt edema. No acute bony abnormality. IMPRESSION: Moderate to large left pleural effusion with left lower lobe atelectasis. Cardiomegaly. Electronically Signed   By: Charlett Nose M.D.   On: 12/28/2017 19:34      Allergies as of 12/30/2017      Reactions   Amlodipine Other (See Comments)   malaise malaise malaise   Amoxicillin-pot Clavulanate Other (See Comments)   Other reaction(s): Other (See Comments) Cannot remember. Cannot remember.   Penicillins Other (See Comments)   Has patient had a PCN reaction causing immediate rash, facial/tongue/throat swelling, SOB or lightheadedness with hypotension: Unknown Has patient had a PCN reaction causing severe rash involving mucus membranes or skin necrosis: Unknown Has patient had a PCN reaction that required hospitalization: Unknown Has patient had a PCN reaction occurring within the last 10 years: No If all of the above answers are  "NO", then may proceed with Cephalosporin use. Cannot remember.   Codeine Rash   Codeine Sulfate Rash      Medication List    STOP taking these medications   lisinopril 2.5 MG tablet Commonly known as:  PRINIVIL,ZESTRIL     TAKE these medications   alum & mag hydroxide-simeth 200-200-20 MG/5ML suspension Commonly known as:  MAALOX/MYLANTA Take 30 mLs by mouth every 6 (six) hours  as needed for indigestion or heartburn.   amiodarone 200 MG tablet Commonly known as:  PACERONE Take 2 tablets (400 mg total) by mouth 2 (two) times daily. 400 mg twice a day for 1 week then 200 mg twice daily   apixaban 5 MG Tabs tablet Commonly known as:  ELIQUIS Take 1 tablet (5 mg total) by mouth 2 (two) times daily.   feeding supplement (ENSURE ENLIVE) Liqd Take 237 mLs by mouth 2 (two) times daily between meals.   furosemide 20 MG tablet Commonly known as:  LASIX Take 1 tablet (20 mg total) by mouth 2 (two) times daily. 40 mg in am and 20 mg at 1400 daily What changed:  additional instructions   metoprolol tartrate 50 MG tablet Commonly known as:  LOPRESSOR Take 1 tablet (50 mg total) by mouth 2 (two) times daily.   potassium chloride 10 MEQ tablet Commonly known as:  K-DUR,KLOR-CON Take 1 tablet (10 mEq total) by mouth 2 (two) times daily.   risperiDONE 0.5 MG tablet Commonly known as:  RISPERDAL Take 1 tablet (0.5 mg total) by mouth 2 (two) times daily as needed (aggitation).          Management plans discussed with the patient's family and they are in agreement. Stable for discharge   Patient should follow up with cardiology  CODE STATUS:     Code Status Orders  (From admission, onward)        Start     Ordered   12/28/17 1816  Full code  Continuous     12/28/17 1815    Code Status History    Date Active Date Inactive Code Status Order ID Comments User Context   11/01/2017 17:20 11/04/2017 17:16 Full Code [id number]  Shaune Pollackhen, Qing, MD Inpatient    Advance Directive  Documentation     Most Recent Value  Type of Advance Directive  Healthcare Power of Attorney, Living will  Pre-existing out of facility DNR order (yellow form or pink MOST form)  No data  "MOST" Form in Place?  No data      TOTAL TIME TAKING CARE OF THIS PATIENT: 38 minutes.    Note: This dictation was prepared with Dragon dictation along with smaller phrase technology. Any transcriptional errors that result from this process are unintentional.  Terrell Ostrand M.D on 12/30/2017 at 12:32 PM  Between 7am to 6pm - Pager - [phone] After 6pm go to [email or web address] - Social research officer, governmentpassword [email or web address]  [facility]  Office  [phone]  CC: Primary care physician; Dale DurhamScott, Charlene, MD

## 2017-12-30 NOTE — [place] FL2 (Addendum)
[place] MEDICAID FL2 LEVEL OF CARE SCREENING TOOL     IDENTIFICATION  Patient Name: Cheryl Winters Birthdate: December 22, 1933 Sex: female Admission Date (Current Location): 12/28/2017  [place] and [place] Number:  Chiropodist and Address:  [facility], [address], [place], [place] [address]      Provider Number: [id number]  Attending Physician Name and Address:  Adrian Saran, MD  Relative Name and Phone Number:       Current Level of Care: Hospital Recommended Level of Care: Assisted Living Facility Prior Approval Number:    Date Approved/Denied:   PASRR Number:    Discharge Plan: (ALF)    Current Diagnoses: Patient Active Problem List   Diagnosis Date Noted  . Acute on chronic systolic CHF (congestive heart failure), NYHA class 3 (HCC) 12/28/2017  . Somnolence 12/28/2017  . Orthostatic hypotension 11/11/2017  . Dilated cardiomyopathy (HCC) 11/03/2017  . Dementia 11/03/2017  . Pleural effusion 11/02/2017  . A-fib (HCC) 11/01/2017  . Atrial fibrillation with RVR (HCC) 11/01/2017  . Memory change 06/27/2016  . Acute cystitis without hematuria 11/03/2015  . Lightheadedness 10/31/2015  . Bradycardia 09/19/2014  . Health care maintenance 09/02/2014  . Medicare annual wellness visit, subsequent 08/21/2012  . Tremor, essential 08/21/2012  . Hypertension 11/22/2011  . Hyperlipidemia 11/22/2011    Orientation RESPIRATION BLADDER Height & Weight     Self  Normal Continent Weight: 154 lb 9.6 oz (70.1 kg) Height:  4\' 11"  (149.9 cm)  BEHAVIORAL SYMPTOMS/MOOD NEUROLOGICAL BOWEL NUTRITION STATUS  (in hopsital pulled out IV and took off telemetry uniit) (none) Continent Diet(cardiac)  AMBULATORY STATUS COMMUNICATION OF NEEDS Skin   Limited Assist Verbally Normal                       Personal Care Assistance Level of Assistance  Bathing, Dressing, Feeding Bathing Assistance: Limited assistance Feeding assistance: Limited  assistance Dressing Assistance: Limited assistance     Functional Limitations Info  (no issues reported)          SPECIAL CARE FACTORS FREQUENCY                       Contractures Contractures Info: Not present    Additional Factors Info  Code Status Code Status Info: full               Medication List     STOP taking these medications   lisinopril 2.5 MG tablet Commonly known as:  PRINIVIL,ZESTRIL     TAKE these medications   alum & mag hydroxide-simeth 200-200-20 MG/5ML suspension Commonly known as:  MAALOX/MYLANTA Take 30 mLs by mouth every 6 (six) hours as needed for indigestion or heartburn.   amiodarone 200 MG tablet Commonly known as:  PACERONE Take 2 tablets (400 mg total) by mouth 2 (two) times daily. 400 mg twice a day for 1 week then 200 mg twice daily   apixaban 5 MG Tabs tablet Commonly known as:  ELIQUIS Take 1 tablet (5 mg total) by mouth 2 (two) times daily.   feeding supplement (ENSURE ENLIVE) Liqd Take 237 mLs by mouth 2 (two) times daily between meals.   furosemide 20 MG tablet Commonly known as:  LASIX Take 1 tablet (20 mg total) by mouth 2 (two) times daily. 40 mg in am and 20 mg at 1400 daily What changed:  additional instructions   metoprolol tartrate 50 MG tablet Commonly known as:  LOPRESSOR Take 1  tablet (50 mg total) by mouth 2 (two) times daily.   potassium chloride 10 MEQ tablet Commonly known as:  K-DUR,KLOR-CON Take 1 tablet (10 mEq total) by mouth 2 (two) times daily.   risperiDONE 0.5 MG tablet Commonly known as:  RISPERDAL Take 1 tablet (0.5 mg total) by mouth 2 (two) times daily as needed (aggitation).         York SpanielMonica Laveta Gilkey, [place]

## 2017-12-30 NOTE — Telephone Encounter (Signed)
TCM....  Patient is being discharged later today  They saw Rytam Dunn om hospital   They are scheduled to see Alycia RossettiRyan  on 01/10/18   They were seen for Afib and RVR     Please call

## 2017-12-30 NOTE — Progress Notes (Signed)
[facility] - [place] at [facility]   PATIENT NAME: Cheryl Winters    MR#:  [id number]  DATE OF BIRTH:  17-May-1934  SUBJECTIVE:   Patient with dementia Was c/o chest pressure  Nurse spoek with Eula Listen   REVIEW OF SYSTEMS:    dementia  Tolerating Diet: yes      DRUG ALLERGIES:   Allergies  Allergen Reactions  . Amlodipine Other (See Comments)    malaise malaise malaise  . Amoxicillin-Pot Clavulanate Other (See Comments)    Other reaction(s): Other (See Comments) Cannot remember. Cannot remember.  . Penicillins Other (See Comments)    Has patient had a PCN reaction causing immediate rash, facial/tongue/throat swelling, SOB or lightheadedness with hypotension: Unknown Has patient had a PCN reaction causing severe rash involving mucus membranes or skin necrosis: Unknown Has patient had a PCN reaction that required hospitalization: Unknown Has patient had a PCN reaction occurring within the last 10 years: No If all of the above answers are "NO", then may proceed with Cephalosporin use. Cannot remember.  . Codeine Rash  . Codeine Sulfate Rash    VITALS:  Blood pressure 105/69, pulse 73, temperature 98.3 F (36.8 C), resp. rate 18, height 4\' 11"  (1.499 m), weight 70.1 kg (154 lb 9.6 oz), SpO2 93 %.  PHYSICAL EXAMINATION:  Constitutional: Appears well-developed and well-nourished. No distress. HENT: Normocephalic. Marland Kitchen Oropharynx is clear and moist.  Eyes: Conjunctivae and EOM are normal. PERRLA, no scleral icterus.  Neck: Normal ROM. Neck supple.. No tracheal deviation. CVS: tachy irr, irr 2/6 SEM no gallops, no carotid bruit.  Pulmonary: Normal respiratory effort with decreased breath sounds left base  Abdominal: Soft. BS +,  no distension, tenderness, rebound or guarding.  Musculoskeletal: Normal range of motion. 1+ LLE no tenderness.  Neuro: Alert. CN 2-12 grossly intact. No focal deficits. Skin: Skin is warm and dry. No rash noted. Psychiatric:  Dementia     LABORATORY PANEL:   CBC Recent Labs  Lab 12/29/17 0520  WBC 4.9  HGB 13.2  HCT 40.4  PLT 166   ------------------------------------------------------------------------------------------------------------------  Chemistries  Recent Labs  Lab 12/28/17 1955  12/30/17 0434  NA 138   < > 137  K 3.7   < > 4.5  CL 104   < > 98*  CO2 25   < > 23  GLUCOSE 128*   < > 156*  BUN 27*   < > 25*  CREATININE 0.97   < > 1.30*  CALCIUM 9.1   < > 9.5  MG 1.9  --   --   AST 27  --   --   ALT 19  --   --   ALKPHOS 52  --   --   BILITOT 1.2  --   --    < > = values in this interval not displayed.   ------------------------------------------------------------------------------------------------------------------  Cardiac Enzymes No results for input(s): TROPONINI in the last 168 hours. ------------------------------------------------------------------------------------------------------------------  RADIOLOGY:  Dg Chest Port 1 View  Result Date: 12/30/2017 CLINICAL DATA:  Shortness of Breath EXAM: PORTABLE CHEST 1 VIEW COMPARISON:  12/28/2017 FINDINGS: Cardiac shadow is stable. Aortic calcifications are again seen. Left-sided pleural effusion is again noted with likely underlying atelectasis. The right lung remains clear. Postsurgical changes in left shoulder are noted. IMPRESSION: Stable left pleural effusion. Electronically Signed   By: Alcide Clever M.D.   On: 12/30/2017 08:56   Dg Chest Port 1 View  Result Date: 12/28/2017 CLINICAL DATA:  Chronic systolic  CHF. Atrial fibrillation. Left pleural effusion. EXAM: PORTABLE CHEST 1 VIEW COMPARISON:  11/17/2017 FINDINGS: Cardiomegaly. Moderate to large left pleural effusion with left lower lobe atelectasis. Right lung clear. No overt edema. No acute bony abnormality. IMPRESSION: Moderate to large left pleural effusion with left lower lobe atelectasis. Cardiomegaly. Electronically Signed   By: Charlett NoseKevin  Dover M.D.   On: 12/28/2017  19:34     ASSESSMENT AND PLAN:   84 year old female with a history of atrial fibrillation, chronic systolic heart failure ejection fraction 30-35% and moderate pulmonary hypertension who presented from cardiologist's office due to rapid atrial fibrillation and increased lower extremity edema.  1. Acute on chronic systolic heart failure ejection fraction 30-35%: Patient is Euvolemic Stop Lasix creatinine is elevated. Continue Metoprolol Continue to monitor intake and output with daily weight CHF clinic upon discharge Patient has recurrent left pleural effusion Chest x-ray shows stable pleural effusion  2. Persistent atrial fibrillation with RVR: Continue Lopressor Continue amiodarone gtt, with plan to change oral hopefully today Continue Eliquis Try to avoid digoxin as per cardiology  3. Dementia: Continue PRN Haldol and  4. Essential hypertension: Continue metoprolol  5.Pleural effusion, left: IR for thoracentesis today  6.  Elevated TSH with normal T4 Patient will benefit from outpatient repeat TSH  Appreciate care consultation  Discussed with cardiology    Management plans discussed with the patient's daughter and she is in agreement.  CODE STATUS: Full  TOTAL TIME TAKING CARE OF THIS PATIENT: 24 minutes.     POSSIBLE D/C tomorrow, DEPENDING ON CLINICAL CONDITION.   Delpha Perko M.D on 12/30/2017 at 9:32 AM  Between 7am to 6pm - Pager - [phone] After 6pm go to [email or web address] - password [email or web address]  [facility]  Office  [phone]  CC: Primary care physician; Dale DurhamScott, Charlene, MD  Note: This dictation was prepared with Dragon dictation along with smaller phrase technology. Any transcriptional errors that result from this process are unintentional.

## 2017-12-30 NOTE — Progress Notes (Signed)
Patient is very disoriented. She has removed telemetry box and iv access. Patient has removed ID badge and alert bracelets from her arm Patient is impulsive at times. Administered haldol and placed patient in low bed.

## 2017-12-30 NOTE — Clinical Social Work Note (Signed)
Clinical Social Work Assessment  Patient Details  Name: Cheryl Winters MRN: [id number] Date of Birth: 14-Nov-1933  Date of referral:  12/30/17               Reason for consult:  Discharge Planning                Permission sought to share information with:    Permission granted to share information::     Name::        Agency::     Relationship::     Contact Information:     Housing/Transportation Living arrangements for the past 2 months:  Assisted Living Facility Source of Information:  Adult Children Patient Interpreter Needed:  None Criminal Activity/Legal Involvement Pertinent to Current Situation/Hospitalization:  No - Comment as needed Significant Relationships:  Adult Children Lives with:  Facility Resident Do you feel safe going back to the place where you live?  Yes Need for family participation in patient care:  Yes (Comment)  Care giving concerns:  Patient resides at [facility]   Social Worker assessment / plan:  CSW informed that patient is ready for discharge and return to [facility]. Patient is confused. CSW spoke with Ivar Drapeindy Boone, the [facility] Administrator, and she had spoken with the daughters and they were going to have patient return to the ALF side versus the memory care side for now. Initially there was concern all the daughters regarding discharge but they spoke with the physicians and were in agreement for her to return to [facility]. Patient's family transported patient.   Employment status:  Retired Database administratornsurance information:  Managed Medicare PT Recommendations:    Information / Referral to community resources:     Patient/Family's Response to care:  Patient's daughters expressed appreciation for CSW assistance.  Patient/Family's Understanding of and Emotional Response to Diagnosis, Current Treatment, and Prognosis:  Patient's daughters very involved in patient's care.  Emotional Assessment Appearance:  Appears stated  age Attitude/Demeanor/Rapport:    Affect (typically observed):    Orientation:  Oriented to Self Alcohol / Substance use:  Not Applicable Psych involvement (Current and /or in the community):  No (Comment)  Discharge Needs  Concerns to be addressed:  Care Coordination Readmission within the last 30 days:  No Current discharge risk:  None Barriers to Discharge:  No Barriers Identified   York SpanielMonica Ferrel [place], LCSW 12/30/2017, 5:45 PM

## 2017-12-30 NOTE — Discharge Planning (Addendum)
Report called to [facility] RN. Discharge packet and prescriptions provided to family. Pt ready for discharge.

## 2017-12-30 NOTE — Telephone Encounter (Signed)
Fax received from [facility] confirming the patient's medication list. All medications were consistent with her outpatient medication list from her 12/28/17 office visit with Alycia Rossettiyan, [place].

## 2017-12-30 NOTE — Progress Notes (Signed)
Progress Note  Patient Name: Cheryl Winters Date of Encounter: 12/30/2017  Primary Cardiologist: Kirke Corin  Subjective   Documented UOP of 400 mL for the past 24 hours, remains documented net + for the admission. Renal function bumped from 0.95-->1.30 this morning. Repeat CXR pending at this time. Remains in Afib with well controlled ventricular rates. Remains on amiodarone infusion, Lopressor 25 mg q 6 hours, and Eliquis. BP improved. No family present this morning.   Inpatient Medications    Scheduled Meds: . apixaban  5 mg Oral BID  . famotidine  20 mg Oral BID  . feeding supplement (ENSURE ENLIVE)  237 mL Oral BID BM  . furosemide  20 mg Intravenous BID  . levothyroxine  25 mcg Oral QAC breakfast  . metoprolol tartrate  25 mg Oral Q6H  . multivitamin with minerals  1 tablet Oral Daily  . potassium chloride  10 mEq Oral BID   Continuous Infusions: . amiodarone 30 mg/hr (12/29/17 2135)   PRN Meds: acetaminophen **OR** acetaminophen, alum & mag hydroxide-simeth, haloperidol lactate **OR** haloperidol lactate, ondansetron **OR** ondansetron (ZOFRAN) IV, risperiDONE   Vital Signs    Vitals:   12/29/17 1919 12/29/17 2130 12/30/17 0152 12/30/17 0404  BP: (!) 120/97  135/66 112/85  Pulse: (!) 36 (!) 119 75 (!) 107  Resp:      Temp: 97.7 F (36.5 C)   98.1 F (36.7 C)  TempSrc: Oral   Oral  SpO2: 99%  100% 99%  Weight:    154 lb 9.6 oz (70.1 kg)  Height:        Intake/Output Summary (Last 24 hours) at 12/30/2017 0730 Last data filed at 12/30/2017 1610 Gross per 24 hour  Intake 680.4 ml  Output 500 ml  Net 180.4 ml   Filed Weights   12/28/17 1652 12/30/17 0404  Weight: 154 lb 11.2 oz (70.2 kg) 154 lb 9.6 oz (70.1 kg)    Telemetry    Afib, 80s to low 100s bpm - Personally Reviewed  ECG    n/a - Personally Reviewed  Physical Exam   GEN: No acute distress.   Neck: No JVD. Cardiac: Irregularly irregular, no murmurs, rubs, or gallops.  Respiratory:  Diminished breath sounds bilaterally, left > right.  GI: Soft, nontender, non-distended.   MS: 1+ bilateral lower extremity pitting edema; No deformity. Neuro:  Alert; Nonfocal.  Psych: Normal affect.  Labs    Chemistry Recent Labs  Lab 12/28/17 1955 12/29/17 0520 12/30/17 0434  NA 138 139 137  K 3.7 3.5 4.5  CL 104 104 98*  CO2 25 25 23   GLUCOSE 128* 98 156*  BUN 27* 25* 25*  CREATININE 0.97 0.95 1.30*  CALCIUM 9.1 9.0 9.5  PROT 6.4*  --   --   ALBUMIN 3.4*  --   --   AST 27  --   --   ALT 19  --   --   ALKPHOS 52  --   --   BILITOT 1.2  --   --   GFRNONAA 53* 55* 37*  GFRAA >60 >60 43*  ANIONGAP 9 10 16*     Hematology Recent Labs  Lab 12/28/17 1955 12/29/17 0520  WBC 4.7 4.9  RBC 4.78 4.69  HGB 13.6 13.2  HCT 40.9 40.4  MCV 85.6 86.1  MCH 28.5 28.1  MCHC 33.3 32.7  RDW 16.1* 16.0*  PLT 162 166    Cardiac EnzymesNo results for input(s): TROPONINI in the last 168 hours. No  results for input(s): TROPIPOC in the last 168 hours.   BNPNo results for input(s): BNP, PROBNP in the last 168 hours.   DDimer No results for input(s): DDIMER in the last 168 hours.   Radiology    Dg Chest Port 1 View  Result Date: 12/28/2017 IMPRESSION: Moderate to large left pleural effusion with left lower lobe atelectasis. Cardiomegaly. Electronically Signed   By: Charlett NoseKevin  Dover M.D.   On: 12/28/2017 19:34    Cardiac Studies   TTE 10/2017: Study Conclusions  - Left ventricle: The cavity size was normal. Systolic function was moderately to severely reduced. The estimated ejection fraction was in the range of 30% to 35%. Diffuse hypokinesis. Regional wall motion abnormalities cannot be excluded. The study is not technically sufficient to allow evaluation of LV diastolic function. - Mitral valve: There was moderate to severe regurgitation. - Left atrium: The atrium was severely dilated. - Right ventricle: The cavity size was moderately dilated. Wall thickness  was normal. Systolic function was mildly reduced. - Right atrium: The atrium was mildly dilated. - Tricuspid valve: There was moderate regurgitation. - Pulmonary arteries: Systolic pressure was moderately elevated. PA peak pressure: 55 mm Hg (S).  Impressions:  - Moderate sized left pleural effusion noted.   Patient Profile     84 y.o. female with history of persistent Afib on Eliquis, chronic systolic CHF, left pleural effusion, bradycardia that improved with stopping of propranolol in 2016, HTN, HLD, dementia, and frequent UTI who was directly admitted from the office on 3/13 with Afib with RVR and acute on chronic systolic CHF.  Assessment & Plan    1. Acute on chronic systolic CHF: -Continued diminished breath sounds on exam -Repeat CXR pending -Renal function bumped with IV Lasix, hold this morning -Consolidate Lopressor as below, transition to TOprol XL down the road given her cardiomyopathy -Lisinopril held upon admission secondary to soft BP, resume when able -Has not been on spironolactone given soft BP, start when able -Daily weights, strict Is and Os -No need to repeat echo given recent study in 10/2017  2. Persistent Afib with RVR: -Ventricular rates improved -Consolidate Lopressor to 50 mg bid -Continue amiodarone infusion for rate control, possible transition to PO on 3/16 -Would ideally like to avoid CCB given her cardiomyopathy -Will try to avoid digoxin given her advanced age and frail state -Continue Eliquis 5 mg bid (does not meet 2/3 reduced dosing criteria) -May benefit from DCCV after she has been adequately loaded with amiodarone  3. Recurrent left pleural effusion: -Renal function bumped with IV Lasix -Repeat CXR pending at this time -May benefit from repeat therapeutic thoracentesis, defer to IM -Lasix held as above  4. Dementia: -Unlikely to be a good candidate for invasive ischemic evaluation of there cardiomyopathy as above.  5.  HTN: -BP stable -Follow  6. Abnormal TSH: -Needs further workup -Defer to IM  7. Dispo: -Palliative care on board, family planning to discuss GOC    For questions or updates, please contact [facility] Please consult [email or web address] for contact info under Cardiology/STEMI.    Signed, Eula Listenyan Tessi Eustache, PA-C [facility] Pager: [phone] 12/30/2017, 7:30 AM

## 2017-12-30 NOTE — Care Management Important Message (Signed)
Important Message  Patient Details  Name: Cheryl Winters MRN: [id number] Date of Birth: [birth year]   Medicare Important Message Given:  N/A - LOS <3 / Initial given by admissions    Chapman FitchBOWEN, Jahaad Penado T, RN 12/30/2017, 10:38 AM

## 2017-12-30 NOTE — Progress Notes (Signed)
PT Cancellation Note  Patient Details Name: Geanie LoganRita Steele Horvath MRN: [id number] DOB: Feb 07, 1934   Cancelled Treatment:    Reason Eval/Treat Not Completed: PT screened, no needs identified, will sign off. (Formal PT evaluation completed previous date; no acute PT needs identified (patient at baseline level of ability).  Per primary RN, no change in status or medical condition since previous date. No need for formal PT re-evaluation.  Will complete order; please re-consult should needs change.)  Elfida Shimada H. Manson PasseyBrown, PT, DPT, NCS 12/30/17, 12:32 PM [phone]

## 2017-12-30 NOTE — Telephone Encounter (Signed)
TCM completed need place to place patient.

## 2018-01-01 NOTE — Telephone Encounter (Signed)
I do not mind seeing her, but she is already scheduled for f/u with CHF clinic and with cardiology for TCM.  Was admitted by cardiology and followed for her cardiac issues.  Requested f/u with cardiology.  Do they still feel need to see me as well.  Just let me know if any problems.

## 2018-01-02 NOTE — Telephone Encounter (Signed)
Patient's daughter, Cheryl Winters [facility]) contacted [facility] on Friday 12/30/17.  Jayme understands to follow up with provider Clarisa Kindredina Hackney, NP on Thursday 01/05/18 at 11:20 am at [facility] & with provider Eula Listenyan Dunn, PA on Tuesday 01/10/18 at 2:30 pm at the [place] office. Jayme understands discharge instructions? Yes Jayme understands medications and regiment? Yes Jayme understands to bring all medications to this visit? Patient is currently at Assisted Living at [facility]- I called and spoke with the nurse, Selena BattenKim and made her aware of the patient's appointments on 3/21 & 3/26 and asked that an updated list of the patient's medications be sent with the patient to both office visits. Kim verbalized understanding.   Per the patient's daughter, Cheryl PercyJayme, the patient has done fairly well over the weekend.  Her "bad cough" went away although she still does have some degree of coughing.  She states her edemas appears to be worse, but was worse before she left the hospital on 12/30/17. The family is frustrated as the patient's dementia has been made worse after her last 2 hospitalizations.  They are also frustrated with the hospital as a case worker finally came to see them just prior to the patient being discharged. The patient is currently in assisted living and the family is wanting to get her moved to a memory care unit. Per Jayme, the case worker basically told them that there was no time to get her transferred to memory care as she was being discharged. Per notes in the chart, the patient's family will be following up with Dr. Lorin PicketScott to discuss assisted living vs memory care.

## 2018-01-02 NOTE — Telephone Encounter (Signed)
Yes daughter wanted to Follow up with PCP due to patient needing memory care Vs assisted living.

## 2018-01-02 NOTE — Telephone Encounter (Signed)
I can see her on 01/06/18 at 12:00.  Please schedule and place on schedule.  Thanks

## 2018-01-03 ENCOUNTER — Telehealth: Payer: Self-pay | Admitting: Physician Assistant

## 2018-01-03 NOTE — Telephone Encounter (Signed)
Daughter spoke with other family members last night and has changed her mind about having the follow up at this time.

## 2018-01-03 NOTE — Telephone Encounter (Signed)
Pt daughter calling stating last week when they saw us  We requested Med list from [facility]  She is calling stating she is suspicious that they are not giving the right medication to patient She is calling to see if we can call her back to tell her what they sent over  Please call back

## 2018-01-03 NOTE — Telephone Encounter (Signed)
I s/w patient's daughter, Rory PercyJayme, who is concerned that [facility] did not give that patient Eliquis for one month. Jayme states there is no documentation Feb 16-March 11 and inquires if we know what was on the medication list that was sent over at her March 13 OV.  I explained the process of updating Epic medication list based on [facility] list.  Rory PercyJayme states she will call [facility] and request to see medication documentation. She is appreciative of the information.

## 2018-01-04 NOTE — Progress Notes (Deleted)
   Patient ID: Cheryl Winters, female    DOB: Feb 06, 1934, 84 y.o.   MRN: [id number]  HPI  Cheryl Winters is a 84 y/o female with a history of  Echo report from 11/03/17 reviewed and showed an EF of 30-35% along with moderate/severe MR, moderate TR and a moderately elevate PA pressure of 53 mm Hg.   Admitted 12/28/17 due to acute HF exacerbation and rapid atrial fibrillation. Cardiology consult obtained. Amiodarone was stared. Had a left thoracentesis done with removal of ~450 cc of fluid. Outpatient palliative care consult to be obtained. She was discharged after 2 days. Was in the ED 11/08/17 due to dizziness where she was treated and released.  She presents today for her initial visit with a chief complaint of   Review of Systems    Physical Exam    Assessment & Plan:  1: Chronic heart failure with reduced ejection fraction- - NYHA class - saw cardiology (Dunn) 12/28/17  2: HTN- - saw PCP (McLean-Scocuzza) 11/11/17 - BMP from 12/30/17 reviewed and showed sodium 137, potassium 4.5 and GFR 37  3: Atrial fibrillation-  4: Dementia- - saw neurologist Malvin Johns([place]) 11/23/17

## 2018-01-05 ENCOUNTER — Ambulatory Visit: Payer: Medicare Other | Admitting: Family

## 2018-01-10 ENCOUNTER — Encounter: Payer: Self-pay | Admitting: Physician Assistant

## 2018-01-10 ENCOUNTER — Ambulatory Visit (INDEPENDENT_AMBULATORY_CARE_PROVIDER_SITE_OTHER): Payer: Medicare Other | Admitting: Physician Assistant

## 2018-01-10 VITALS — BP 96/70 | HR 76 | Ht 59.0 in | Wt 152.0 lb

## 2018-01-10 DIAGNOSIS — I481 Persistent atrial fibrillation: Secondary | ICD-10-CM

## 2018-01-10 DIAGNOSIS — J90 Pleural effusion, not elsewhere classified: Secondary | ICD-10-CM | POA: Diagnosis not present

## 2018-01-10 DIAGNOSIS — I10 Essential (primary) hypertension: Secondary | ICD-10-CM | POA: Diagnosis not present

## 2018-01-10 DIAGNOSIS — F0390 Unspecified dementia without behavioral disturbance: Secondary | ICD-10-CM | POA: Diagnosis not present

## 2018-01-10 DIAGNOSIS — I420 Dilated cardiomyopathy: Secondary | ICD-10-CM | POA: Diagnosis not present

## 2018-01-10 DIAGNOSIS — I4819 Other persistent atrial fibrillation: Secondary | ICD-10-CM

## 2018-01-10 DIAGNOSIS — I5022 Chronic systolic (congestive) heart failure: Secondary | ICD-10-CM

## 2018-01-10 MED ORDER — METOPROLOL TARTRATE 25 MG PO TABS: 25.0000 mg | ORAL_TABLET | Freq: Two times a day (BID) | ORAL | 1 refills | Status: DC

## 2018-01-10 MED ORDER — AMIODARONE HCL 200 MG PO TABS: 200.0000 mg | ORAL_TABLET | Freq: Every day | ORAL | 2 refills | Status: DC

## 2018-01-10 NOTE — Patient Instructions (Addendum)
Medication Instructions:  Your physician has recommended you make the following change in your medication:  1- DECREASE Amiodarone to 200 mg by mouth once a day.  2- DECREASE Lopressor to 25 mg by mouth two times a day.   Labwork: Your physician recommends that you return for lab work in: TODAY (BMET).   Testing/Procedures: none  Follow-Up: Your physician recommends that you schedule a follow-up appointment in: 1 MONTH WITH DR ARIDA OR RYAN.  If you need a refill on your cardiac medications before your next appointment, please call your pharmacy.

## 2018-01-10 NOTE — Progress Notes (Signed)
Cardiology Office Note Date:  01/10/2018  Patient ID:  Cheryl Winters Marchant, DOB 11/12/1933, MRN [id number] PCP:  Dale DurhamScott, Charlene, MD  Cardiologist:  Dr. Kirke CorinArida, MD    Chief Complaint: Hospital follow up  History of Present Illness: Cheryl Winters Ahlers is a 84 y.o. female with history of persistent Afib on Eliquis, chronic systolic CHF, left pleural effusion, bradycardia that improved with stopping of propranolol in 2016, HTN, HLD, dementia, and frequent UTI who presents for hospital follow up.   She was admitted in 10/2017 with new onset Afib with RVR. She was rate controlled and started on Eliquis. Her home medications were continued in additon to rate controlling medications (metoprolol and IV Cardizem), and as a result she required transfer to the ICU for hypotension. Echo showed an EF of 30-35%, diffuse hypokinesis, moderate to severe mitral regurgitation, severely dilated left atrium, moderately dilated RV with mildly reduced RVSF, moderate pulmonary hypertension with a PASP of 55 mmHg. She was most recently seen by Dr. Kirke CorinArida on 11/15/17 and continued to note fatigue and dizziness. She was referred to pulmonology for evaluation of her persistent left pleural effusion. She underwent successful ultrasound-guided thoracentesis on 11/17/2017, with approximately 550 mL of clear yellow fluid aspirated. Labs on this fluid showed no growth, protein of 2.2 (transudate), LDH 66, cytology with mixed inflammation and reactive mesothelial cells. She was seen in the office 12/28/17 for follow up of her Afib and noted to be volume overloaded with acute on chronic systolic CHF and in Afib with RVR. It was felt she required IV diuresis and rate controlling in the setting of her symptoms, heart rate, and relative hypotension. She was diuresed and underwent repeat left-sided thoracentesis with improvement in symptoms. She loaded with amiodarone with plans for outpatient DCCV and she was rate controlled with Lopressor. She was  seen by Palliative Care with plans for ongoing discussions regarding Hospice.   She is accompanied by her 2 daughters today. Family feels like the patient is doing considerably better from a cardiac perspective since her recent admission. Her SOB seems to be improved as well as her lower extremity swelling. Patient remains significantly confused and is not eating well. They are trying to remind her to eat and drink. Weight is down 3 pounds from her last visit her on 12/28/17 (155-->152 pounds). She continues to take amiodarone 200 mg bid along with Lopressor 50 mg bid and Eliquis 5 mg bid. Family is considering keeping the patient in Afib as she appears to be tolerating this at this time.    Past Medical History:  Diagnosis Date  . Abdominal pain, right upper quadrant   . Benign essential tremor   . Carpal tunnel syndrome   . Chronic combined systolic and diastolic CHF (congestive heart failure) (HCC)    a. TTE 10/2017: EF 30-35%, diffuse HK, not technically sufficient to allow for LV diastolic function, moderate to severe MR, severely dilated left atrium measuring 54 mm, moderately dilated RV with mild systolic reduction, mildly dilated right atrium, moderate TR, PASP 55 mmHg, moderate left-sided pleural effusion  . Dementia    a. mixed vascular and Alzheimer's  . DJD (degenerative joint disease) of knee   . GERD (gastroesophageal reflux disease)   . History of stress test    a. 10/2015 showed no evidence of ischemia, EF 62%, low risk study  . HTN (hypertension)   . Hyperlipidemia   . Persistent atrial fibrillation (HCC)    a. diagnosed 10/2017; b. CHADS2VASc => 6 (CHF,  HTN, age x 2, vascular disease, female); c. on Eliquis  . Syncope and collapse   . Systolic CHF (HCC)   . Ulnar nerve lesion   . UTI (urinary tract infection)   . Vitamin D deficiency     Past Surgical History:  Procedure Laterality Date  . Bilateral Bletharoplastices    . BREAST BIOPSY  1957 and 1962   x 2, Nml per pt    . BREAST CYST EXCISION     x 2 - nml path per pt  . CARPAL TUNNEL RELEASE  2004  . CATARACT EXTRACTION    . TOTAL KNEE ARTHROPLASTY     bilateral, total, Marcy Panning  . TOTAL SHOULDER REPLACEMENT  08/2010   left  . TUBAL LIGATION    . UMBILICAL HERNIA REPAIR    . VAGINAL DELIVERY     1 set Twins/2 single births    Current Meds  Medication Sig  . alum & mag hydroxide-simeth (MAALOX/MYLANTA) 200-200-20 MG/5ML suspension Take 30 mLs by mouth every 6 (six) hours as needed for indigestion or heartburn.  Marland Kitchen amiodarone (PACERONE) 200 MG tablet Take 2 tablets (400 mg total) by mouth 2 (two) times daily. 400 mg twice a day for 1 week then 200 mg twice daily  . apixaban (ELIQUIS) 5 MG TABS tablet Take 1 tablet (5 mg total) by mouth 2 (two) times daily.  . feeding supplement, ENSURE ENLIVE, (ENSURE ENLIVE) LIQD Take 237 mLs by mouth 2 (two) times daily between meals.  . furosemide (LASIX) 20 MG tablet Take 1 tablet (20 mg total) by mouth 2 (two) times daily. 40 mg in am and 20 mg at 1400 daily  . metoprolol tartrate (LOPRESSOR) 50 MG tablet Take 1 tablet (50 mg total) by mouth 2 (two) times daily.  . potassium chloride (K-DUR,KLOR-CON) 10 MEQ tablet Take 1 tablet (10 mEq total) by mouth 2 (two) times daily.  . risperiDONE (RISPERDAL) 0.5 MG tablet Take 1 tablet (0.5 mg total) by mouth 2 (two) times daily as needed (aggitation).    Allergies:   Amlodipine; Amoxicillin-pot clavulanate; Penicillins; Codeine; and Codeine sulfate   Social History:  The patient  reports that she has never smoked. She has never used smokeless tobacco. She reports that she does not drink alcohol or use drugs.   Family History:  The patient's family history includes Alcohol abuse in her other; Arthritis in her mother; Brain cancer (age of onset: 46) in her father; Breast cancer in her maternal aunt; Dementia in her mother; Early death (age of onset: 71) in her father; Hypertension in her mother; Stroke in her  mother.  ROS:   Review of Systems  Unable to perform ROS: Dementia     PHYSICAL EXAM:  VS:  BP 96/70 (BP Location: Left Arm, Patient Position: Sitting, Cuff Size: Normal)   Pulse 76   Ht 4\' 11"  (1.499 m)   Wt 152 lb (68.9 kg)   BMI 30.70 kg/m  BMI: Body mass index is 30.7 kg/m.  Physical Exam  Constitutional: She is oriented to person, place, and time. She appears well-developed and well-nourished.  HENT:  Head: Normocephalic and atraumatic.  Eyes: Right eye exhibits no discharge. Left eye exhibits no discharge.  Neck: Normal range of motion. No JVD present.  Cardiovascular: Normal rate, S1 normal and S2 normal. An irregularly irregular rhythm present. Exam reveals no distant heart sounds, no friction rub, no midsystolic click and no opening snap.  Murmur heard. High-pitched blowing holosystolic murmur is present with  a grade of 3/6 at the apex. Pulses:      Posterior tibial pulses are 2+ on the right side, and 2+ on the left side.  Pulmonary/Chest: Effort normal and breath sounds normal. No respiratory distress. She has no decreased breath sounds. She has no wheezes. She has no rales. She exhibits no tenderness.  Abdominal: Soft. She exhibits no distension. There is no tenderness.  Musculoskeletal: She exhibits edema.  1+ bilateral pitting edema to the mid shins  Neurological: She is alert and oriented to person, place, and time.  Skin: Skin is warm and dry. No cyanosis. Nails show no clubbing.  Psychiatric: She has a normal mood and affect. Her speech is normal and behavior is normal. Judgment and thought content normal.     EKG:  Was ordered and interpreted by me today. Shows Afib, 76 bpm, low voltage QRS, nonspecific st/t changes   Recent Labs: 11/14/2017: B Natriuretic Peptide 648.0 12/28/2017: ALT 19; Magnesium 1.9; TSH 22.490 12/29/2017: Hemoglobin 13.2; Platelets 166 12/30/2017: BUN 25; Creatinine, Ser 1.30; Potassium 4.5; Sodium 137  07/15/2017: Cholesterol 245; HDL  64.10; LDL Cholesterol 169; Total CHOL/HDL Ratio 4; Triglycerides 59.0; VLDL 11.8   Estimated Creatinine Clearance: 28.7 mL/min (A) (by C-G formula based on SCr of 1.3 mg/dL (H)).   Wt Readings from Last 3 Encounters:  01/10/18 152 lb (68.9 kg)  12/30/17 154 lb 9.6 oz (70.1 kg)  12/28/17 155 lb (70.3 kg)     Other studies reviewed: Additional studies/records reviewed today include: summarized above  ASSESSMENT AND PLAN:  1. Persistent Afib: Ventricular rates well controlled. Family is uncertain if they would like to move forward with a possible DCCV, though are not yet ready to commit to rate control strategy only. For this reason, we will continue amiodarone 200 mg day at this time. If they decide to not pursue rhythm control with a DCCV in follow up, I would recommend she stop amiodarone and continue with Lopressor. Decrease Lopressor to 25 mg bid due to soft BP in the office today with associated decreased PO intake. Continue Eliquis 5 mg bid as she does not meet 2/3 reduced dosing criteria at this time. Check bmet as below.   2. Chronic systolic CHF: She does not appear grossly volume up. There is probably some component of venous insufficiency regarding her lower extremity swelling as the patient sits in a chair all day with her legs hanging down. Recommend leg elevation with compression stockings (if she would tolerate due to dementia). Check bmet. For now, continue current dose of Lasix 40 mg q AM and 20 mg q PM. Lopressor as above. Not on ACEi/ARB/spironolactone due to hypotension.   3. Left pleural effusion: Status post thoracentesis as above during hospital admission.   4. HTN: BP on the soft side today, asymptomatic. Likely multifactorial including decreased PO intake and medication. Decrease Lopressor to 25 mg bid.   5. Dementia: Overall, poor prognosis. Agree with Palliative Care as an outpatient. Needs GOC discussion.   Disposition: F/u with Dr. Kirke Corin, MD in 1 month  Current  medicines are reviewed at length with the patient today.  The patient did not have any concerns regarding medicines.  Signed, Eula Listen, PA-C 01/10/2018 2:33 PM     [facility] [place] [address] [place], [place] [address] [phone]

## 2018-01-11 ENCOUNTER — Telehealth: Payer: Self-pay | Admitting: Cardiovascular Disease

## 2018-01-11 LAB — BASIC METABOLIC PANEL
BUN/Creatinine Ratio: 14 (ref 12–28)
BUN: 15 mg/dL (ref 8–27)
CALCIUM: 8.9 mg/dL (ref 8.7–10.3)
CHLORIDE: 99 mmol/L (ref 96–106)
CO2: 26 mmol/L (ref 20–29)
Creatinine, Ser: 1.07 mg/dL — ABNORMAL HIGH (ref 0.57–1.00)
GFR calc Af Amer: 56 mL/min/{1.73_m2} — ABNORMAL LOW (ref >59)
GFR calc non Af Amer: 49 mL/min/{1.73_m2} — ABNORMAL LOW (ref >59)
Glucose: 101 mg/dL — ABNORMAL HIGH (ref 65–99)
POTASSIUM: 3.6 mmol/L (ref 3.5–5.2)
Sodium: 140 mmol/L (ref 134–144)

## 2018-01-11 NOTE — Telephone Encounter (Signed)
Pt daughter returning our call for lab work  Please call back

## 2018-01-11 NOTE — Telephone Encounter (Signed)
Reviewed lab results and medication recommendation with patient's daughter, Rory PercyJayme. She understands that Victorino DikeJennifer, Charity fundraiserN, has discussed with [facility] and has routed orders to them.

## 2018-01-11 NOTE — Telephone Encounter (Addendum)
Left message for patient's daughter to contact the office.  Per lab note, labs reviewed and faxed to staff at [facility].

## 2018-01-19 ENCOUNTER — Telehealth: Payer: Self-pay | Admitting: *Deleted

## 2018-01-19 NOTE — Telephone Encounter (Signed)
Please see note below. 

## 2018-01-19 NOTE — Telephone Encounter (Signed)
I spoke with the patient's daughter, Rory PercyJayme.  I advised her that Dr. Kirke CorinArida will not adjust the patient's risperdal. This will need to be done by the patient's PCP, Dr. Lorin PicketScott.  Per Jayme, they were supposed to follow up with Dr. Lorin PicketScott after the patient was discharged, but the family did not feel this was necessary.  Per Jayme, they will touch base with the patient's neurologist regarding risperdal.

## 2018-01-19 NOTE — Telephone Encounter (Signed)
Pt daughter called and is wanting Dr. Kirke CorinArida to change the frequeuincy of her Risperdal from as needed to daily. Patient daughter states this medications is working and [facility]  living facility is having to give it to her daily to calm her down. Patient daughter is requesting a call back. Her contact # is  [phone]. Please advise. Thank you

## 2018-01-31 ENCOUNTER — Emergency Department: Payer: Medicare Other

## 2018-01-31 ENCOUNTER — Emergency Department
Admission: EM | Admit: 2018-01-31 | Discharge: 2018-01-31 | Disposition: A | Discharge status: Home / Self Care | Payer: Medicare Other | Attending: Student in an Organized Health Care Education/Training Program | Admitting: Student in an Organized Health Care Education/Training Program

## 2018-01-31 DIAGNOSIS — Z79899 Other long term (current) drug therapy: Secondary | ICD-10-CM | POA: Insufficient documentation

## 2018-01-31 DIAGNOSIS — L03115 Cellulitis of right lower limb: Secondary | ICD-10-CM | POA: Insufficient documentation

## 2018-01-31 DIAGNOSIS — R2241 Localized swelling, mass and lump, right lower limb: Secondary | ICD-10-CM | POA: Insufficient documentation

## 2018-01-31 DIAGNOSIS — I110 Hypertensive heart disease with heart failure: Secondary | ICD-10-CM | POA: Insufficient documentation

## 2018-01-31 DIAGNOSIS — I5020 Unspecified systolic (congestive) heart failure: Secondary | ICD-10-CM | POA: Diagnosis not present

## 2018-01-31 DIAGNOSIS — M7989 Other specified soft tissue disorders: Secondary | ICD-10-CM

## 2018-01-31 LAB — CBC WITH DIFFERENTIAL/PLATELET
Basophils Absolute: 0 10*3/uL (ref 0–0.1)
Basophils Relative: 1 %
Eosinophils Absolute: 0.3 10*3/uL (ref 0–0.7)
Eosinophils Relative: 5 %
HEMATOCRIT: 35.8 % (ref 35.0–47.0)
HEMOGLOBIN: 11.9 g/dL — AB (ref 12.0–16.0)
LYMPHS ABS: 0.7 10*3/uL — AB (ref 1.0–3.6)
Lymphocytes Relative: 14 %
MCH: 29.2 pg (ref 26.0–34.0)
MCHC: 33.1 g/dL (ref 32.0–36.0)
MCV: 88.1 fL (ref 80.0–100.0)
MONOS PCT: 9 %
Monocytes Absolute: 0.5 10*3/uL (ref 0.2–0.9)
NEUTROS ABS: 3.7 10*3/uL (ref 1.4–6.5)
NEUTROS PCT: 71 %
Platelets: 233 10*3/uL (ref 150–440)
RBC: 4.07 MIL/uL (ref 3.80–5.20)
RDW: 18 % — ABNORMAL HIGH (ref 11.5–14.5)
WBC: 5.2 10*3/uL (ref 3.6–11.0)

## 2018-01-31 LAB — BASIC METABOLIC PANEL
Anion gap: 8 (ref 5–15)
BUN: 14 mg/dL (ref 6–20)
CHLORIDE: 100 mmol/L — AB (ref 101–111)
CO2: 28 mmol/L (ref 22–32)
CREATININE: 0.8 mg/dL (ref 0.44–1.00)
Calcium: 8.7 mg/dL — ABNORMAL LOW (ref 8.9–10.3)
GFR calc Af Amer: >60 mL/min (ref >60)
GFR calc non Af Amer: >60 mL/min (ref >60)
Glucose, Bld: 91 mg/dL (ref 65–99)
Potassium: 3.3 mmol/L — ABNORMAL LOW (ref 3.5–5.1)
Sodium: 136 mmol/L (ref 135–145)

## 2018-01-31 MED ORDER — CLINDAMYCIN HCL 300 MG PO CAPS: 300.0000 mg | ORAL_CAPSULE | Freq: Three times a day (TID) | ORAL | 0 refills | Status: AC

## 2018-01-31 MED ORDER — CLINDAMYCIN HCL 150 MG PO CAPS
300.0000 mg | ORAL_CAPSULE | Freq: Once | ORAL | Status: AC
  Administered 2018-01-31: 300 mg via ORAL
  Filled 2018-01-31: qty 2

## 2018-01-31 NOTE — ED Notes (Signed)
Pt and family verbalize d/c understanding, follow up , and rX given. Pt in NAD, VSS at departure. Family signed for pt due to hx of dementia. Family denies any further concerns at this time

## 2018-01-31 NOTE — ED Provider Notes (Signed)
[facility] Emergency Department Provider Note    First MD Initiated Contact with Patient 01/31/18 [phone]     (approximate)  I have reviewed the triage vital signs and the nursing notes.   HISTORY  Chief Complaint Leg Swelling  Level V caveat:  dementia  HPI Cheryl Winters is a 84 y.o. female presents to the ER for right lower extremity swelling and redness.  Since patient uncertain as to exactly when it started but thinks it started yesterday.  History is limited due to underlying dementia.  No report from facility of shortness of breath, chest pain, fever, nausea, vomiting or diarrhea.  Does appear the patient was previously on Eliquis but more that she arrives today she states that she stopped her Eliquis on the 20th of last month.  Past Medical History:  Diagnosis Date  . Abdominal pain, right upper quadrant   . Benign essential tremor   . Carpal tunnel syndrome   . Chronic combined systolic and diastolic CHF (congestive heart failure) (HCC)    a. TTE 10/2017: EF 30-35%, diffuse HK, not technically sufficient to allow for LV diastolic function, moderate to severe MR, severely dilated left atrium measuring 54 mm, moderately dilated RV with mild systolic reduction, mildly dilated right atrium, moderate TR, PASP 55 mmHg, moderate left-sided pleural effusion  . Dementia    a. mixed vascular and Alzheimer's  . DJD (degenerative joint disease) of knee   . GERD (gastroesophageal reflux disease)   . History of stress test    a. 10/2015 showed no evidence of ischemia, EF 62%, low risk study  . HTN (hypertension)   . Hyperlipidemia   . Persistent atrial fibrillation (HCC)    a. diagnosed 10/2017; b. CHADS2VASc => 6 (CHF, HTN, age x 2, vascular disease, female); c. on Eliquis  . Syncope and collapse   . Systolic CHF (HCC)   . Ulnar nerve lesion   . UTI (urinary tract infection)   . Vitamin D deficiency    Family History  Problem Relation Age of Onset  .  Hypertension Mother   . Arthritis Mother   . Stroke Mother   . Dementia Mother   . Early death Father 69       from brain cancer  . Brain cancer Father 29  . Breast cancer Maternal Aunt   . Alcohol abuse Other    Past Surgical History:  Procedure Laterality Date  . Bilateral Bletharoplastices    . BREAST BIOPSY  1957 and 1962   x 2, Nml per pt  . BREAST CYST EXCISION     x 2 - nml path per pt  . CARPAL TUNNEL RELEASE  2004  . CATARACT EXTRACTION    . TOTAL KNEE ARTHROPLASTY     bilateral, total, Marcy Panning  . TOTAL SHOULDER REPLACEMENT  08/2010   left  . TUBAL LIGATION    . UMBILICAL HERNIA REPAIR    . VAGINAL DELIVERY     1 set Twins/2 single births   Patient Active Problem List   Diagnosis Date Noted  . Acute on chronic systolic CHF (congestive heart failure), NYHA class 3 (HCC) 12/28/2017  . Somnolence 12/28/2017  . Orthostatic hypotension 11/11/2017  . Dilated cardiomyopathy (HCC) 11/03/2017  . Dementia 11/03/2017  . Pleural effusion 11/02/2017  . A-fib (HCC) 11/01/2017  . Atrial fibrillation with RVR (HCC) 11/01/2017  . Memory change 06/27/2016  . Acute cystitis without hematuria 11/03/2015  . Lightheadedness 10/31/2015  . Bradycardia 09/19/2014  .  Health care maintenance 09/02/2014  . Medicare annual wellness visit, subsequent 08/21/2012  . Tremor, essential 08/21/2012  . Hypertension 11/22/2011  . Hyperlipidemia 11/22/2011      Prior to Admission medications   Medication Sig Start Date End Date Taking? Authorizing Provider  alum & mag hydroxide-simeth (MAALOX/MYLANTA) 200-200-20 MG/5ML suspension Take 30 mLs by mouth every 6 (six) hours as needed for indigestion or heartburn. 11/04/17   Shaune Pollack, MD  amiodarone (PACERONE) 200 MG tablet Take 1 tablet (200 mg total) by mouth daily. 01/10/18   Dunn, Raymon Mutton, PA-C  apixaban (ELIQUIS) 5 MG TABS tablet Take 1 tablet (5 mg total) by mouth 2 (two) times daily. 12/01/17   Iran Ouch, MD  clindamycin  (CLEOCIN) 300 MG capsule Take 1 capsule (300 mg total) by mouth 3 (three) times daily for 7 days. 01/31/18 02/07/18  Willy Eddy, MD  feeding supplement, ENSURE ENLIVE, (ENSURE ENLIVE) LIQD Take 237 mLs by mouth 2 (two) times daily between meals. 12/30/17   Adrian Saran, MD  furosemide (LASIX) 20 MG tablet Take 1 tablet (20 mg total) by mouth 2 (two) times daily. 40 mg in am and 20 mg at 1400 daily 12/30/17   Adrian Saran, MD  metoprolol tartrate (LOPRESSOR) 25 MG tablet Take 1 tablet (25 mg total) by mouth 2 (two) times daily. 01/10/18 04/10/18  Sondra Barges, PA-C  potassium chloride (K-DUR,KLOR-CON) 10 MEQ tablet Take 1 tablet (10 mEq total) by mouth 2 (two) times daily. 11/17/17   Iran Ouch, MD  risperiDONE (RISPERDAL) 0.5 MG tablet Take 1 tablet (0.5 mg total) by mouth 2 (two) times daily as needed (aggitation). 12/30/17   Adrian Saran, MD    Allergies Amlodipine; Amoxicillin-pot clavulanate; Penicillins; Codeine; and Codeine sulfate    Social History Social History   Tobacco Use  . Smoking status: Never Smoker  . Smokeless tobacco: Never Used  Substance Use Topics  . Alcohol use: No  . Drug use: No    Review of Systems Patient denies headaches, rhinorrhea, blurry vision, numbness, shortness of breath, chest pain, edema, cough, abdominal pain, nausea, vomiting, diarrhea, dysuria, fevers, rashes or hallucinations unless otherwise stated above in HPI. ____________________________________________   PHYSICAL EXAM:  VITAL SIGNS: Vitals:   01/31/18 1115 01/31/18 1140  BP:  124/78  Pulse: 94   Resp:  16  Temp:    SpO2: 95%     Constitutional: Alert and in no acute distress. Eyes: Conjunctivae are normal.  Head: Atraumatic. Nose: No congestion/rhinnorhea. Mouth/Throat: Mucous membranes are moist.   Neck: No stridor. Painless ROM.  Cardiovascular: Normal rate, regular rhythm. Grossly normal heart sounds.  Good peripheral circulation. Respiratory: Normal respiratory  effort.  No retractions. Lungs CTAB. Gastrointestinal: Soft and nontender. No distention. No abdominal bruits. No CVA tenderness. Genitourinary:  Musculoskeletal: BLE edema with right shin erythema, no crepitus.  No joint effusions. Neurologic:  Normal speech and language. No gross focal neurologic deficits are appreciated. No facial droop Skin:  Skin is warm, dry and intact. No rash noted. Psychiatric: Mood and affect are normal. Speech and behavior are normal.  ____________________________________________   LABS (all labs ordered are listed, but only abnormal results are displayed)  Results for orders placed or performed during the hospital encounter of 01/31/18 (from the past 24 hour(s))  CBC with Differential/Platelet     Status: Abnormal   Collection Time: 01/31/18 10:06 AM  Result Value Ref Range   WBC 5.2 3.6 - 11.0 K/uL   RBC 4.07 3.80 -  5.20 MIL/uL   Hemoglobin 11.9 (L) 12.0 - 16.0 g/dL   HCT 13.235.8 44.035.0 - 10.247.0 %   MCV 88.1 80.0 - 100.0 fL   MCH 29.2 26.0 - 34.0 pg   MCHC 33.1 32.0 - 36.0 g/dL   RDW 72.518.0 (H) 36.611.5 - 44.014.5 %   Platelets 233 150 - 440 K/uL   Neutrophils Relative % 71 %   Neutro Abs 3.7 1.4 - 6.5 K/uL   Lymphocytes Relative 14 %   Lymphs Abs 0.7 (L) 1.0 - 3.6 K/uL   Monocytes Relative 9 %   Monocytes Absolute 0.5 0.2 - 0.9 K/uL   Eosinophils Relative 5 %   Eosinophils Absolute 0.3 0 - 0.7 K/uL   Basophils Relative 1 %   Basophils Absolute 0.0 0 - 0.1 K/uL  Basic metabolic panel     Status: Abnormal   Collection Time: 01/31/18 10:06 AM  Result Value Ref Range   Sodium 136 135 - 145 mmol/L   Potassium 3.3 (L) 3.5 - 5.1 mmol/L   Chloride 100 (L) 101 - 111 mmol/L   CO2 28 22 - 32 mmol/L   Glucose, Bld 91 65 - 99 mg/dL   BUN 14 6 - 20 mg/dL   Creatinine, Ser 3.470.80 0.44 - 1.00 mg/dL   Calcium 8.7 (L) 8.9 - 10.3 mg/dL   GFR calc non Af Amer >60 >60 mL/min   GFR calc Af Amer >60 >60 mL/min   Anion gap 8 5 - 15    ____________________________________________ ____________________________________________  RADIOLOGY  I personally reviewed all radiographic images ordered to evaluate for the above acute complaints and reviewed radiology reports and findings.  These findings were personally discussed with the patient.  Please see medical record for radiology report.  ____________________________________________   PROCEDURES  Procedure(s) performed:  Procedures    Critical Care performed: no ____________________________________________   INITIAL IMPRESSION / ASSESSMENT AND PLAN / ED COURSE  Pertinent labs & imaging results that were available during my care of the patient were reviewed by me and considered in my medical decision making (see chart for details).  DDX: dvt, cellulitis, venous stasis  Cheryl Winters is a 84 y.o. who presents to the ED with swelling of right leg as well as some area of early probable cellulitis given its unilateral erythema.  No evidence of DVT.  Blood work is otherwise reassuring.  Will start patient on clindamycin as she is penicillin allergic.  Otherwise nontoxic appearing and stable for further outpatient follow-up.      As part of my medical decision making, I reviewed the following data within the electronic MEDICAL RECORD NUMBER Nursing notes reviewed and incorporated, Labs reviewed, notes from prior ED visits and [place] Controlled Substance Database   ____________________________________________   FINAL CLINICAL IMPRESSION(S) / ED DIAGNOSES  Final diagnoses:  Leg swelling  Cellulitis of right lower extremity      NEW MEDICATIONS STARTED DURING THIS VISIT:  Discharge Medication List as of 01/31/2018 11:23 AM    START taking these medications   Details  clindamycin (CLEOCIN) 300 MG capsule Take 1 capsule (300 mg total) by mouth 3 (three) times daily for 7 days., Starting Tue 01/31/2018, Until Tue 02/07/2018, Print         Note:  This document  was prepared using Dragon voice recognition software and may include unintentional dictation errors.    Willy Eddyobinson, Cavion Faiola, MD 01/31/18 [phone]

## 2018-01-31 NOTE — ED Triage Notes (Signed)
Pt to ED via [facility] via [facility] c/o bilateral leg swelling. Per EMS staff states family reported concern of increased bilateral leg swelling. Swelling, redness noted bilaterally; drainage reported from left leg per EMS. Patient alert and oriented x3 in no acute distress.

## 2018-02-08 ENCOUNTER — Telehealth: Payer: Self-pay | Admitting: Cardiovascular Disease

## 2018-02-08 NOTE — Telephone Encounter (Signed)
Patient daughter calling back to check on status [facility]

## 2018-02-08 NOTE — Telephone Encounter (Signed)
I spoke with the patient's daughter, Rory PercyJayme. She states that the patient has been having issues with lower extremity swelling- this was so bad that she had to go to the ER on 01/31/18. Per Jayme, the patient did not have a cough at the time. Swelling was from the knees down. They were red and tender. The patient did have a "sore" to her left outer calf area. Per the ER report, the patient was placed on antibiotics for possible cellulitis. Last dose of antibiotics was either Monday night or yesterday morning. The patient is currently at [facility] and the physician for the facility would not see the patient without the appropriate paper work being submitted. Per Jayme, this was all completed this morning, but has to go through a "process." Jayme's concern today is that the patient has redness returning to there legs. She is not SOB, but her swelling is bilateral from her thighs to her lower calf area. The patient's ankles are not swollen.  Per Jayme, the patient's legs are so tight that an impression cannot be left on her legs.  She also reports that the patient's feet are gray.  Jayme was concerned this was due to medication changes made at the patient's last office visit, but I advised her she was not placed on any new meds- the doses of her amiodarone and metoprolol were decreased.   I advised Jayme that this does not sound like a cardiac issue to me, but I will review with Alycia Rossettiyan, PA and let her know what his recommendations are. The patient is currently afebrile as far as Rory PercyJayme is aware. She is unsure if the patient's legs are weeping today. Right lower extremity us was negative for DVT in the hospital. I have also made Jayme aware that if the facility physician for [facility] could see her, this would be the best approach with the patient's dementia.  However, I will call her back once Alycia RossettiRyan reviews- she is agreeable.

## 2018-02-08 NOTE — Telephone Encounter (Signed)
I spoke with Jayme. She is aware of Ryan's recommendations for the facility physician to assess the patient. Per Rory PercyJayme, the physician at [facility] is only there on Tuesdays and Thursdays. I have advised Jayme if she feels like the patient has not gotten acutely worse, then to please touch base with [facility] and request that the physician there see her tomorrow to determine next steps. Rory PercyJayme is agreeable and voices understanding.

## 2018-02-08 NOTE — Telephone Encounter (Signed)
Agree with RN. I would recommend in-house physician evaluate the patient in person today, including weight trend. She has previously been on Eliquis (for Afib), though ED note mentions she may not have been taking this, however it was not stopped at our office visit. If she truly has been taking Eliquis as prescribed, a DVT would be unlikely. She does have an EF of 30-35% so I cannot rule out CHF exacerbation especially if her Afib has been poorly controlled recently at [facility], however she does appear to be denying and SOB/cough. If she is felt to be volume overloaded or in Afib with RVR by examining physician, she may require ED evaluation. Otherwise, I would defer possible lower extremity cellulitis to [facility] physician. Regarding her "grey feet, " I defer evaluation/possible imaging to in-house physician as they can assess her in person.

## 2018-02-08 NOTE — Telephone Encounter (Signed)
Pt daughter calling stating she thinks since the change in medications since we last saw her has caused her to have really bad swelling in legs. Patient ended up going to ED about a week ago for this She states patient was given antibiotics, which she just finished  Feet were turning grey and legs were red.   But would like a call back on this  Please call back

## 2018-02-14 ENCOUNTER — Encounter: Payer: Self-pay | Admitting: Cardiovascular Disease

## 2018-02-14 ENCOUNTER — Telehealth: Payer: Self-pay | Admitting: Cardiovascular Disease

## 2018-02-14 ENCOUNTER — Ambulatory Visit (INDEPENDENT_AMBULATORY_CARE_PROVIDER_SITE_OTHER): Payer: Medicare Other | Admitting: Cardiovascular Disease

## 2018-02-14 VITALS — BP 110/72 | HR 77 | Ht 59.0 in | Wt 160.8 lb

## 2018-02-14 DIAGNOSIS — I482 Chronic atrial fibrillation, unspecified: Secondary | ICD-10-CM

## 2018-02-14 DIAGNOSIS — I5022 Chronic systolic (congestive) heart failure: Secondary | ICD-10-CM | POA: Diagnosis not present

## 2018-02-14 DIAGNOSIS — I872 Venous insufficiency (chronic) (peripheral): Secondary | ICD-10-CM

## 2018-02-14 NOTE — Telephone Encounter (Signed)
Pt daughter states [employer] needs a detailed rx for compression stockings. They need clarification when stockings are to be put on pt and when they are to come off. Please fax to [phone]

## 2018-02-14 NOTE — Telephone Encounter (Signed)
I already provided them with a detailed prescription.  The stockings need to be placed on at 8 in the morning and removed before bedtime.

## 2018-02-14 NOTE — Progress Notes (Signed)
Cardiology Office Note   Date:  02/14/2018   ID:  Cheryl Winters, DOB Mar 17, 1934, MRN [id number]  PCP:  Dale Nicoma [place], MD  Cardiologist:   Lorine Bears, MD   Chief Complaint  Patient presents with  . Other    1 month follow up. Patient c/o leg pain. Meds reviewed verbally with patient.       History of Present Illness: Cheryl Winters is a 84 y.o. female who presents for a follow-up regarding persistent atrial fibrillation, chronic systolic heart failure, left pleural effusion and history of bradycardia.   She has chronic medical conditions that include hypertension, dementia, hyperlipidemia and frequent UTI. She had previous bradycardia in 2016 that improved after stopping propranolol. She was hospitalized on January 15 with atrial fibrillation with rapid ventricular response .  Echocardiogram showed an EF of 30-35% with diffuse hypokinesis, moderate to severe mitral regurgitation, severely dilated left atrium, moderately dilated right ventricle with mildly reduced systolic function and moderate pulmonary hypertension with peak systolic pressure of 55 mmHg.  She had recurrent left-sided pleural effusion that required thoracentesis twice.  Most recently, she has been dealing with worsening leg edema and possible cellulitis.  She is on antibiotics.  It appears that most likely this is due to chronic venous insufficiency with significant stasis dermatitis.  She was placed on amiodarone with plans for possible cardioversion given that her rate was difficult to control.  However, her rate became controlled with amiodarone and metoprolol and we are currently pursuing rate control strategy. She does have dementia with gradual decline in overall functional capacity.  She is staying at [facility].  Past Medical History:  Diagnosis Date  . Abdominal pain, right upper quadrant   . Benign essential tremor   . Carpal tunnel syndrome   . Chronic combined systolic and diastolic CHF  (congestive heart failure) (HCC)    a. TTE 10/2017: EF 30-35%, diffuse HK, not technically sufficient to allow for LV diastolic function, moderate to severe MR, severely dilated left atrium measuring 54 mm, moderately dilated RV with mild systolic reduction, mildly dilated right atrium, moderate TR, PASP 55 mmHg, moderate left-sided pleural effusion  . Dementia    a. mixed vascular and Alzheimer's  . DJD (degenerative joint disease) of knee   . GERD (gastroesophageal reflux disease)   . History of stress test    a. 10/2015 showed no evidence of ischemia, EF 62%, low risk study  . HTN (hypertension)   . Hyperlipidemia   . Persistent atrial fibrillation (HCC)    a. diagnosed 10/2017; b. CHADS2VASc => 6 (CHF, HTN, age x 2, vascular disease, female); c. on Eliquis  . Syncope and collapse   . Systolic CHF (HCC)   . Ulnar nerve lesion   . UTI (urinary tract infection)   . Vitamin D deficiency     Past Surgical History:  Procedure Laterality Date  . Bilateral Bletharoplastices    . BREAST BIOPSY  1957 and 1962   x 2, Nml per pt  . BREAST CYST EXCISION     x 2 - nml path per pt  . CARPAL TUNNEL RELEASE  2004  . CATARACT EXTRACTION    . TOTAL KNEE ARTHROPLASTY     bilateral, total, Marcy Panning  . TOTAL SHOULDER REPLACEMENT  08/2010   left  . TUBAL LIGATION    . UMBILICAL HERNIA REPAIR    . VAGINAL DELIVERY     1 set Twins/2 single births     Current Outpatient  Medications  Medication Sig Dispense Refill  . alum & mag hydroxide-simeth (MAALOX/MYLANTA) 200-200-20 MG/5ML suspension Take 30 mLs by mouth every 6 (six) hours as needed for indigestion or heartburn. 355 mL 0  . amiodarone (PACERONE) 200 MG tablet Take 1 tablet (200 mg total) by mouth daily. 90 tablet 2  . apixaban (ELIQUIS) 5 MG TABS tablet Take 1 tablet (5 mg total) by mouth 2 (two) times daily. 180 tablet 4  . clindamycin (CLEOCIN) 300 MG capsule     . feeding supplement, ENSURE ENLIVE, (ENSURE ENLIVE) LIQD Take 237 mLs  by mouth 2 (two) times daily between meals. 237 mL 12  . furosemide (LASIX) 20 MG tablet Take 1 tablet (20 mg total) by mouth 2 (two) times daily. 40 mg in am and 20 mg at 1400 daily 180 tablet 0  . metoprolol tartrate (LOPRESSOR) 25 MG tablet Take 1 tablet (25 mg total) by mouth 2 (two) times daily. 180 tablet 1  . mirtazapine (REMERON) 30 MG tablet     . potassium chloride (K-DUR,KLOR-CON) 10 MEQ tablet Take 1 tablet (10 mEq total) by mouth 2 (two) times daily. 180 tablet 3  . risperiDONE (RISPERDAL) 0.5 MG tablet Take 1 tablet (0.5 mg total) by mouth 2 (two) times daily as needed (aggitation). 60 tablet 0   No current facility-administered medications for this visit.     Allergies:   Amlodipine; Amoxicillin-pot clavulanate; Penicillins; Codeine; and Codeine sulfate    Social History:  The patient  reports that she has never smoked. She has never used smokeless tobacco. She reports that she does not drink alcohol or use drugs.   Family History:  The patient's family history includes Alcohol abuse in her other; Arthritis in her mother; Brain cancer (age of onset: 41) in her father; Breast cancer in her maternal aunt; Dementia in her mother; Early death (age of onset: 86) in her father; Hypertension in her mother; Stroke in her mother.    ROS:  Please see the history of present illness.   Otherwise, review of systems are positive for none.   All other systems are reviewed and negative.    PHYSICAL EXAM: VS:  BP 110/72 (BP Location: Left Arm, Patient Position: Sitting, Cuff Size: Normal)   Pulse 77   Ht  (1.499 m)   Wt 160 lb 12 oz (72.9 kg)   BMI 32.47 kg/m  , BMI Body mass index is 32.47 kg/m. GEN: Well nourished, well developed, in no acute distress  HEENT: normal  Neck: no JVD, carotid bruits, or masses Cardiac: Irregularly irregular ; no murmurs, rubs, or gallops, mild bilateral leg edema with significant stasis dermatitis and ulceration. Respiratory: Diminished breath  sound at the left base with few crackles at the right base, normal work of breathing GI: soft, nontender, nondistended, + BS MS: no deformity or atrophy  Skin: warm and dry, no rash Neuro:  Strength and sensation are intact Psych: euthymic mood, full affect   EKG:  EKG is ordered today. EKG showed atrial fibrillation with ventricular rate of 77 bpm.  Poor R wave progression in the anterior leads  Recent Labs: 11/14/2017: B Natriuretic Peptide 648.0 12/28/2017: ALT 19; Magnesium 1.9; TSH 22.490 01/31/2018: BUN 14; Creatinine, Ser 0.80; Hemoglobin 11.9; Platelets 233; Potassium 3.3; Sodium 136    Lipid Panel    Component Value Date/Time   CHOL 245 (H) 07/15/2017 0817   TRIG 59.0 07/15/2017 0817   HDL 64.10 07/15/2017 0817   CHOLHDL 4 07/15/2017 1610  VLDL 11.8 07/15/2017 0817   LDLCALC 169 (H) 07/15/2017 0817   LDLDIRECT 123.9 08/27/2013 0814      Wt Readings from Last 3 Encounters:  02/14/18 160 lb 12 oz (72.9 kg)  01/31/18 152 lb (68.9 kg)  01/10/18 152 lb (68.9 kg)        ASSESSMENT AND PLAN:  1.  Chronic atrial fibrillation: I suspect that she has transition to chronic atrial fibrillation.  Considering her age, dementia and comorbidities, I think the best option is to continue with rate control therapy as she does not seem to be significantly symptomatic.  Continue amiodarone now mainly for rate control in addition to metoprolol.  She is on long-term anticoagulation with Eliquis.  2.  Recurrent left pleural effusion:  Likely due to heart failure.  Status post thoracentesis twice.  3.  Chronic systolic heart failure: Reduced EF is likely due to A. fib with RVR.  However, underlying ischemia cannot be excluded.  Given her age and dementia, I do not think she is going to be a good candidate for cardiac catheterization.  Recommend continuing medical therapy.  She appears to be euvolemic on current dose of furosemide 20 mg twice daily.  4.  Chronic venous insufficiency with  ulceration: I advised her to use knee-high support stockings during the day with leg elevation.   Disposition:   Follow-up with me in 3 months.  Signed,  Lorine Bears, MD  02/14/2018 2:31 PM    [facility]

## 2018-02-14 NOTE — Patient Instructions (Signed)
Medication Instructions: Continue same medications.   Labwork: None.   Procedures/Testing: None.   Follow-Up: 3 months with Dr. Kirke Corin.   Any Additional Special Instructions Will Be Listed Below (If Applicable).  Use knee high support stockings during the day.    If you need a refill on your cardiac medications before your next appointment, please call your pharmacy.

## 2018-02-15 NOTE — Telephone Encounter (Signed)
This encounter and Dr Jari Sportsman office visit notes from yesterday faxed to number provided.

## 2018-03-02 ENCOUNTER — Telehealth: Payer: Self-pay | Admitting: Cardiovascular Disease

## 2018-03-02 NOTE — Telephone Encounter (Signed)
Routing to Dr Arida for advice.  

## 2018-03-02 NOTE — Telephone Encounter (Signed)
There is an interaction between amiodarone and Lexapro which might lead to QT prolongation. We can actually stop amiodarone and keep her on metoprolol for rate control. Lexapro can be used once amiodarone is stopped.

## 2018-03-02 NOTE — Telephone Encounter (Signed)
Spoke with Eunice Blase at [employer] [employer]. She verbalized understanding of Dr Jari Sportsman recommendations. She asked that I fax this and last office visit note with medication list to [phone]. Those things faxed.

## 2018-03-02 NOTE — Telephone Encounter (Signed)
Cheryl Winters with [facility] calling stating NP Smiley Houseman is needing advise  They are wanting to start patient on Lexapro 2.5 mg for a week and then 5 mg daily They are wanting to know if we can give an okay for patient does have heart issues..   Please advise

## 2018-03-23 IMAGING — US US THORACENTESIS ASP PLEURAL SPACE W/IMG GUIDE
1 series · 3 of 3 positions shown · non-contrast
Comparison: none

INDICATION: Persistent left pleural effusion and adjacent airspace disease

[Series 1: us thoracentesis asp pleural space w/img guide · 0.25mm/px · 3 of 3 slices shown]
[im 1/3]
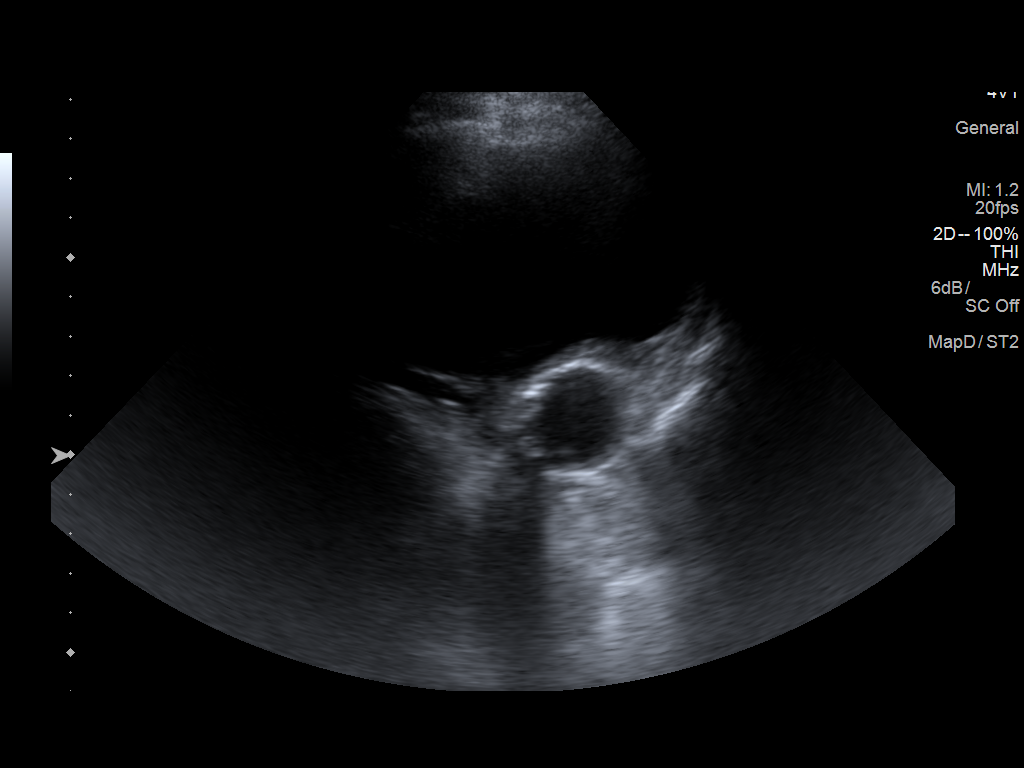
[im 2/3]
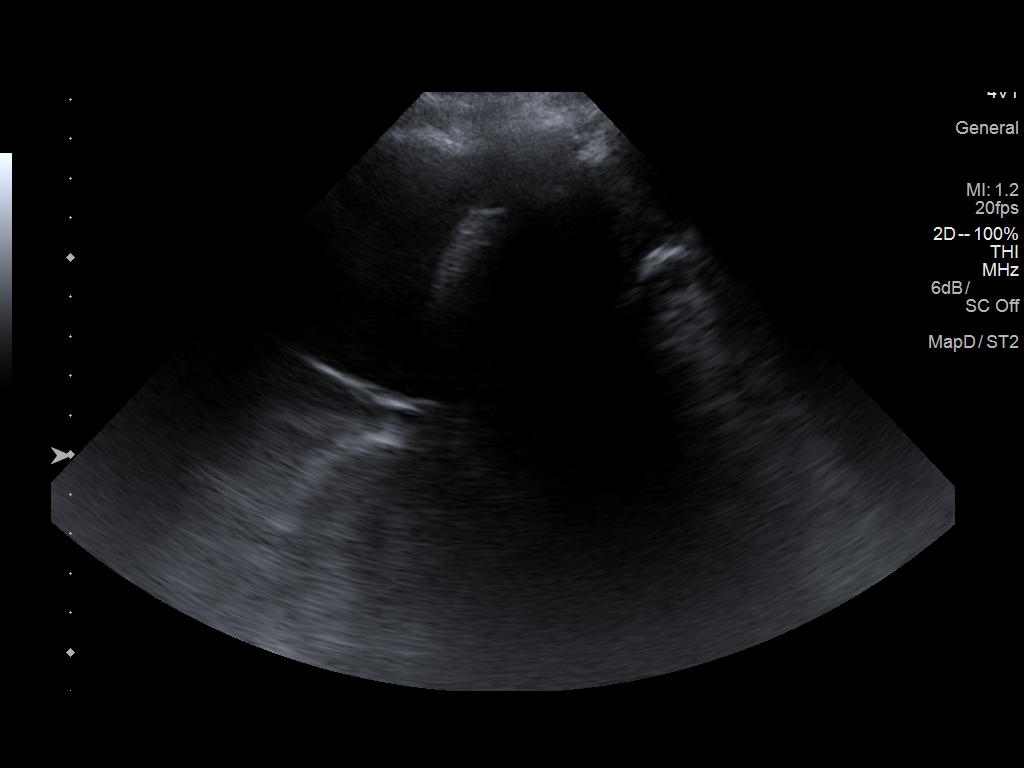
[im 3/3]
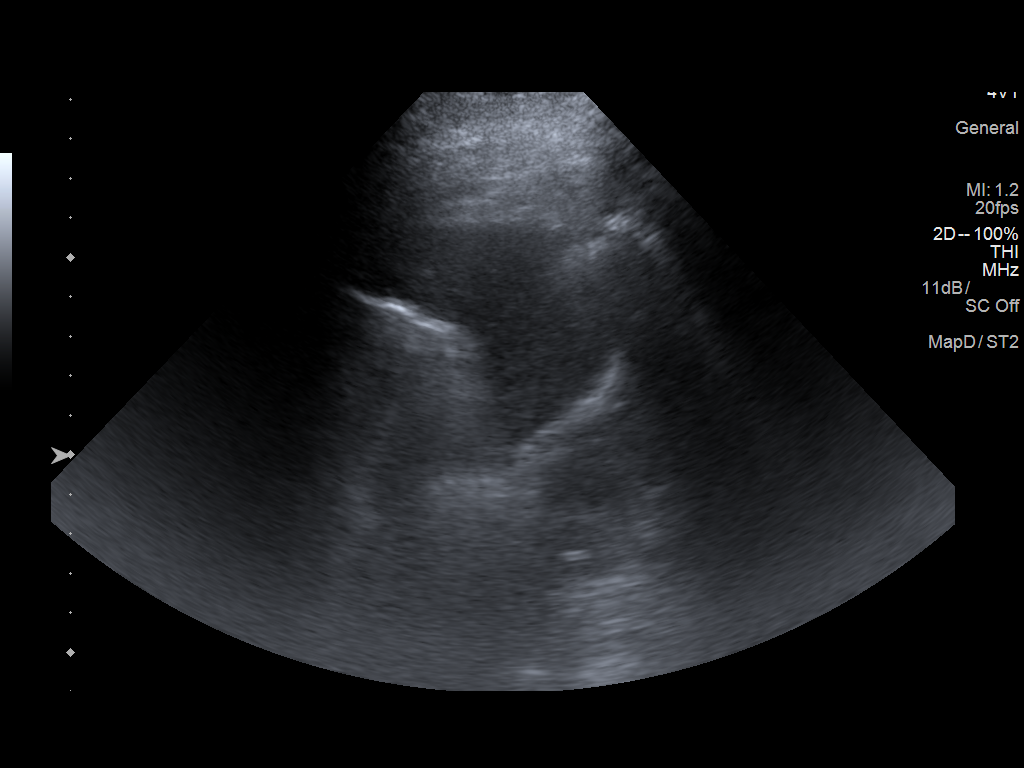

[3 of 3 positions shown; findings below may reference images not displayed]

EXAM:
ULTRASOUND GUIDED LEFT THORACENTESIS

MEDICATIONS:
No antibiotics indicated

COMPLICATIONS:
None immediate.

PROCEDURE:
An ultrasound guided thoracentesis was thoroughly discussed with the
patient and daughter, and questions answered. The benefits, risks,
alternatives and complications were also discussed. The patient
understands and wishes to proceed with the procedure. Written
consent was obtained.

Ultrasound was performed to localize and mark an adequate pocket of
fluid in the left chest. The area was then prepped and draped in the
normal sterile fashion. 1% Lidocaine was used for local anesthesia.
Under ultrasound guidance a 6 Fr Safe-T-Centesis catheter was
introduced. Thoracentesis was performed. The catheter was removed
and a dressing applied.
FINDINGS: A total of approximately 550 mL of clear yellow fluid was removed.
Samples were sent to the laboratory as requested by the clinical
team.
IMPRESSION: Successful ultrasound guided left thoracentesis yielding 550 mL of
pleural fluid.

Follow-up chest film shows no pneumothorax.

## 2018-04-24 ENCOUNTER — Emergency Department: Payer: Medicare Other

## 2018-04-24 ENCOUNTER — Other Ambulatory Visit: Payer: Self-pay

## 2018-04-24 ENCOUNTER — Emergency Department
Admission: EM | Admit: 2018-04-24 | Discharge: 2018-04-24 | Disposition: A | Discharge status: Skilled Nursing Facility | Payer: Medicare Other | Attending: Emergency Medicine | Admitting: Emergency Medicine

## 2018-04-24 DIAGNOSIS — M25511 Pain in right shoulder: Secondary | ICD-10-CM | POA: Diagnosis not present

## 2018-04-24 DIAGNOSIS — I10 Essential (primary) hypertension: Secondary | ICD-10-CM | POA: Diagnosis not present

## 2018-04-24 DIAGNOSIS — Z79899 Other long term (current) drug therapy: Secondary | ICD-10-CM | POA: Diagnosis not present

## 2018-04-24 DIAGNOSIS — S0990XA Unspecified injury of head, initial encounter: Secondary | ICD-10-CM | POA: Diagnosis present

## 2018-04-24 DIAGNOSIS — Y92002 Bathroom of unspecified non-institutional (private) residence single-family (private) house as the place of occurrence of the external cause: Secondary | ICD-10-CM | POA: Insufficient documentation

## 2018-04-24 DIAGNOSIS — Y998 Other external cause status: Secondary | ICD-10-CM | POA: Insufficient documentation

## 2018-04-24 DIAGNOSIS — Z7901 Long term (current) use of anticoagulants: Secondary | ICD-10-CM | POA: Insufficient documentation

## 2018-04-24 DIAGNOSIS — S0083XA Contusion of other part of head, initial encounter: Secondary | ICD-10-CM | POA: Diagnosis not present

## 2018-04-24 DIAGNOSIS — Y939 Activity, unspecified: Secondary | ICD-10-CM | POA: Insufficient documentation

## 2018-04-24 DIAGNOSIS — W19XXXA Unspecified fall, initial encounter: Secondary | ICD-10-CM

## 2018-04-24 DIAGNOSIS — F0390 Unspecified dementia without behavioral disturbance: Secondary | ICD-10-CM | POA: Diagnosis not present

## 2018-04-24 DIAGNOSIS — W01198A Fall on same level from slipping, tripping and stumbling with subsequent striking against other object, initial encounter: Secondary | ICD-10-CM | POA: Diagnosis not present

## 2018-04-24 LAB — BASIC METABOLIC PANEL
Anion gap: 7 (ref 5–15)
BUN: 14 mg/dL (ref 8–23)
CALCIUM: 8.9 mg/dL (ref 8.9–10.3)
CO2: 31 mmol/L (ref 22–32)
CREATININE: 0.82 mg/dL (ref 0.44–1.00)
Chloride: 102 mmol/L (ref 98–111)
Glucose, Bld: 85 mg/dL (ref 70–99)
Potassium: 3.5 mmol/L (ref 3.5–5.1)
SODIUM: 140 mmol/L (ref 135–145)

## 2018-04-24 LAB — URINALYSIS, COMPLETE (UACMP) WITH MICROSCOPIC
Bacteria, UA: NONE SEEN
Bilirubin Urine: NEGATIVE
Glucose, UA: NEGATIVE mg/dL
Hgb urine dipstick: NEGATIVE
Ketones, ur: NEGATIVE mg/dL
Leukocytes, UA: NEGATIVE
Nitrite: NEGATIVE
PH: 7 (ref 5.0–8.0)
Protein, ur: NEGATIVE mg/dL
SPECIFIC GRAVITY, URINE: 1.006 (ref 1.005–1.030)
SQUAMOUS EPITHELIAL / LPF: NONE SEEN (ref 0–5)
WBC, UA: NONE SEEN WBC/hpf (ref 0–5)

## 2018-04-24 LAB — CBC
HCT: 36.3 % (ref 35.0–47.0)
HEMOGLOBIN: 12.2 g/dL (ref 12.0–16.0)
MCH: 30.5 pg (ref 26.0–34.0)
MCHC: 33.7 g/dL (ref 32.0–36.0)
MCV: 90.4 fL (ref 80.0–100.0)
PLATELETS: 208 10*3/uL (ref 150–440)
RBC: 4.01 MIL/uL (ref 3.80–5.20)
RDW: 14.6 % — ABNORMAL HIGH (ref 11.5–14.5)
WBC: 5.1 10*3/uL (ref 3.6–11.0)

## 2018-04-24 LAB — TROPONIN I: Troponin I: <0.03 ng/mL (ref <0.03)

## 2018-04-24 MED ORDER — METOPROLOL TARTRATE 25 MG PO TABS
25.0000 mg | ORAL_TABLET | Freq: Once | ORAL | Status: AC
  Administered 2018-04-24: 25 mg via ORAL
  Filled 2018-04-24: qty 1

## 2018-04-24 MED ORDER — SODIUM CHLORIDE 0.9 % IV BOLUS
500.0000 mL | Freq: Once | INTRAVENOUS | Status: AC
  Administered 2018-04-24: 500 mL via INTRAVENOUS

## 2018-04-24 MED ORDER — ACETAMINOPHEN 500 MG PO TABS
1000.0000 mg | ORAL_TABLET | Freq: Once | ORAL | Status: AC
  Administered 2018-04-24: 1000 mg via ORAL
  Filled 2018-04-24: qty 2

## 2018-04-24 NOTE — ED Notes (Signed)
Patient transported to X-ray 

## 2018-04-24 NOTE — ED Provider Notes (Signed)
[facility] Emergency Department Provider Note  ____________________________________________  Time seen: Approximately 4:29 PM  I have reviewed the triage vital signs and the nursing notes.   HISTORY  Chief Complaint Fall    HPI Cheryl Winters is a 84 y.o. female with a history of dementia, HTN, HL, A. fib on Eliquis, presenting with a fall.  The patient is a poor historian due to her dementia and is unable to give most of the history.  Her daughter accompanies her, but was not present during the fall.  Per report, the patient was in the bathroom when she turned around to flush the toilet and fell forward, hitting her forehead on the commode.  It is unknown whether she lost consciousness but the patient has been able to ambulate since the fall.  There is been no recent illness including fevers or chills, nausea vomiting or diarrhea, the patient has not been complaining of any chest pain.  At this time, the patient has right upper extremity pain.  She denies any nausea or vomiting, headache, visual changes, numbness tingling or weakness.  Past Medical History:  Diagnosis Date  . Abdominal pain, right upper quadrant   . Benign essential tremor   . Carpal tunnel syndrome   . Chronic combined systolic and diastolic CHF (congestive heart failure) (HCC)    a. TTE 10/2017: EF 30-35%, diffuse HK, not technically sufficient to allow for LV diastolic function, moderate to severe MR, severely dilated left atrium measuring 54 mm, moderately dilated RV with mild systolic reduction, mildly dilated right atrium, moderate TR, PASP 55 mmHg, moderate left-sided pleural effusion  . Dementia    a. mixed vascular and Alzheimer's  . DJD (degenerative joint disease) of knee   . GERD (gastroesophageal reflux disease)   . History of stress test    a. 10/2015 showed no evidence of ischemia, EF 62%, low risk study  . HTN (hypertension)   . Hyperlipidemia   . Persistent atrial  fibrillation (HCC)    a. diagnosed 10/2017; b. CHADS2VASc => 6 (CHF, HTN, age x 2, vascular disease, female); c. on Eliquis  . Syncope and collapse   . Systolic CHF (HCC)   . Ulnar nerve lesion   . UTI (urinary tract infection)   . Vitamin D deficiency     Patient Active Problem List   Diagnosis Date Noted  . Acute on chronic systolic CHF (congestive heart failure), NYHA class 3 (HCC) 12/28/2017  . Somnolence 12/28/2017  . Orthostatic hypotension 11/11/2017  . Dilated cardiomyopathy (HCC) 11/03/2017  . Dementia 11/03/2017  . Pleural effusion 11/02/2017  . A-fib (HCC) 11/01/2017  . Atrial fibrillation with RVR (HCC) 11/01/2017  . Memory change 06/27/2016  . Acute cystitis without hematuria 11/03/2015  . Lightheadedness 10/31/2015  . Bradycardia 09/19/2014  . Health care maintenance 09/02/2014  . Medicare annual wellness visit, subsequent 08/21/2012  . Tremor, essential 08/21/2012  . Hypertension 11/22/2011  . Hyperlipidemia 11/22/2011    Past Surgical History:  Procedure Laterality Date  . Bilateral Bletharoplastices    . BREAST BIOPSY  1957 and 1962   x 2, Nml per pt  . BREAST CYST EXCISION     x 2 - nml path per pt  . CARPAL TUNNEL RELEASE  2004  . CATARACT EXTRACTION    . TOTAL KNEE ARTHROPLASTY     bilateral, total, Marcy Panning  . TOTAL SHOULDER REPLACEMENT  08/2010   left  . TUBAL LIGATION    . UMBILICAL HERNIA REPAIR    .  VAGINAL DELIVERY     1 set Twins/2 single births    Current Outpatient Rx  . Order #: [id number] Class: Print  . Order #: [id number] Class: Historical Med  . Order #: [id number] Class: Normal  . Order #: [id number] Class: Historical Med  . Order #: [id number] Class: Historical Med  . Order #: [id number] Class: Print  . Order #: [id number] Class: Print  . Order #: [id number] Class: Historical Med  . Order #: [id number] Class: Historical Med  . Order #: [id number] Class: Normal  . Order #: [id number] Class: Normal  . Order #: [id number] Class: Print  .  Order #: [id number] Class: Historical Med  . Order #: [id number] Class: Normal    Allergies Amlodipine; Amoxicillin-pot clavulanate; Penicillins; Codeine; and Codeine sulfate  Family History  Problem Relation Age of Onset  . Hypertension Mother   . Arthritis Mother   . Stroke Mother   . Dementia Mother   . Early death Father 65       from brain cancer  . Brain cancer Father 52  . Breast cancer Maternal Aunt   . Alcohol abuse Other     Social History Social History   Tobacco Use  . Smoking status: Never Smoker  . Smokeless tobacco: Never Used  Substance Use Topics  . Alcohol use: No  . Drug use: No    Review of Systems Able to obtain due to patient dementia.  ____________________________________________   PHYSICAL EXAM:  VITAL SIGNS: ED Triage Vitals  Enc Vitals Group     BP 04/24/18 1433 (!) 157/114     Pulse Rate 04/24/18 1433 66     Resp 04/24/18 1433 18     Temp 04/24/18 1605 (!) 97.5 F (36.4 C)     Temp Source 04/24/18 1605 Oral     SpO2 04/24/18 1433 99 %     Weight 04/24/18 1433 140 lb (63.5 kg)     Height 04/24/18 1433 5\' 2"  (1.575 m)     Head Circumference --      Peak Flow --      Pain Score 04/24/18 1433 10     Pain Loc --      Pain Edu? --      Excl. in GC? --     Constitutional: Patient is alert and oriented to person and place.  She is resting comfortably; chronically ill-appearing. Eyes: Conjunctivae are normal.  EOMI. PERRLA.  No scleral icterus.  No raccoon eyes. Head: 6 x 6 cm frontal contusion at the top of the forehead and into the scalp line without any laceration.  No palpable defect in the scalp.  No battle sign.. Nose: No congestion/rhinnorhea.  No swelling over the nose or septal hematoma. Mouth/Throat: Mucous membranes are moist.  No malocclusion or dental injury.  Neck: No stridor.  Supple.  Full range of motion.  I examined the patient's neck 3 separate times.  One time, she was completely asymptomatic.  The second time, she  described a "pulling" sensation in the left lateral neck with range of motion.  The third time, she had some midline mid C-spine tenderness.  There are no step-offs or deformities.  Her exam is not reliable. Cardiovascular: Normal rate, irregular rhythm. No murmurs, rubs or gallops.  Respiratory: Normal respiratory effort.  No accessory muscle use or retractions. Lungs CTAB.  No wheezes, rales or ronchi. Gastrointestinal: Soft, nontender and nondistended.  No guarding or rebound.  No peritoneal signs. Musculoskeletal: Full range of motion of the left shoulder, lateral elbows, bilateral wrists without pain.  The patient does  have tenderness to palpation over the proximal shaft of the humerus and does not have full range of motion in the right humerus.  She has normal radial pulses bilaterally.  She has 5 out of 5 grip strength bilaterally.  She has no skin changes over the upper extremities bilaterally.  The patient has full range of motion of the bilateral hips and ankles, right knee without pain.  She has a mild effusion over the left knee that is described as chronic by her daughter and some discomfort with range of motion.  She has old incisional scars over the bilateral knees.  She does have evidence of peripheral vascular disease with thickened skin, hair loss, over the bilateral lower 70s.  She has symmetric bilateral lower extremity edema around the ankles.  She has normal DP and PT pulses bilaterally.  The patient has no midline C-spine tenderness to palpation, step-offs or deformities.  The patient does have some mild midline L-spine tenderness that is diffuse over approximately L1-L3 without any palpable step-offs or deformities; there are no overlying skin changes including ecchymosis. Neurologic:  A&Ox3.  Speech is clear.  Face and smile are symmetric.  EOMI.  Moves all extremities well. Skin:  Skin is warm, dry. Psychiatric: Mood and affect are normal.   ____________________________________________   LABS (all labs ordered are listed, but only abnormal results are displayed)  Labs Reviewed  CBC - Abnormal; Notable for the following components:      Result Value   RDW 14.6 (*)    All other components within normal limits  URINALYSIS, COMPLETE (UACMP) WITH MICROSCOPIC - Abnormal; Notable for the following components:   Color, Urine STRAW (*)    APPearance CLEAR (*)    All other components within normal limits  BASIC METABOLIC PANEL  TROPONIN I   ____________________________________________  EKG  ED ECG REPORT I, Rockne Menghini, the attending physician, personally viewed and interpreted this ECG.   Date: 04/24/2018  EKG Time: 1602  Rate: 90  Rhythm: afib  Axis: normal  Intervals:borderline prolonged QTc  ST&T Change: No STEMI  ____________________________________________  RADIOLOGY  Dg Lumbar Spine Complete  Result Date: 04/24/2018 CLINICAL DATA:  84 year old who fell at the nursing home while going to the bathroom earlier today. Initial encounter. EXAM: LUMBAR SPINE - COMPLETE 4+ VIEW COMPARISON:  None. FINDINGS: Five non-rib-bearing lumbar vertebrae. Thoracolumbar levoscoliosis. Osseous demineralization. Grade 1 spondylolisthesis of L4 on L5 measuring approximately 6 mm, related to severe facet degenerative changes at this level. No acute fractures. Disc space narrowing and associated endplate hypertrophic changes at T12-L1 (severe), L2-3 (moderate), and L3-4 (moderate). No pars defects. Diffuse facet degenerative changes. Sacroiliac joints intact. Calcified uterine fibroid in the pelvis. Aortoiliac atherosclerosis. IMPRESSION: 1. No acute osseous abnormality. 2. Degenerative grade 1 spondylolisthesis of L4 on L5 measuring approximately 6 mm. 3. Multilevel degenerative disc disease and spondylosis, worst at T12-L1. Electronically Signed   By: Hulan Saas M.D.   On: 04/24/2018 17:17   Dg Shoulder Right  Result  Date: 04/24/2018 CLINICAL DATA:  84 year old who fell at the nursing home while going to the bathroom earlier today. Initial encounter. EXAM: RIGHT SHOULDER - 2+ VIEW COMPARISON:  None. FINDINGS: No evidence of acute fracture. Glenohumeral joint intact with mild joint space narrowing. Subacromial space well-preserved. Acromioclavicular joint intact with minimal degenerative changes. Severe osseous demineralization. Calcification at the insertion of the supraspinatus tendon on the greater tuberosity of the humerus. IMPRESSION: 1. No acute osseous abnormality. 2. Mild osteoarthritis. 3. Chronic calcific supraspinatus  tendinitis. 4. Severe osseous demineralization. Electronically Signed   By: Hulan Saas M.D.   On: 04/24/2018 17:12   Ct Head Wo Contrast  Result Date: 04/24/2018 CLINICAL DATA:  Fall.  Pain.  Patient anticoagulated. EXAM: CT HEAD WITHOUT CONTRAST TECHNIQUE: Contiguous axial images were obtained from the base of the skull through the vertex without intravenous contrast. COMPARISON:  CT head 11/08/2017. FINDINGS: Brain: No evidence for acute infarction, hemorrhage, mass lesion, hydrocephalus, or extra-axial fluid. Generalized atrophy. Hypoattenuation of white matter, likely small vessel disease. Vascular: Calcification of the cavernous internal carotid arteries consistent with cerebrovascular atherosclerotic disease. No signs of intracranial large vessel occlusion. Skull: Calvarium intact. RIGHT frontal scalp hematoma. Hyperostosis. Sinuses/Orbits: Clear sinuses.  BILATERAL cataract extraction. Other: None. IMPRESSION: Atrophy and small vessel disease.  No acute intracranial findings. Frontal scalp hematoma. No intracranial hemorrhage. No skull fracture. Electronically Signed   By: Elsie Stain M.D.   On: 04/24/2018 15:18   Ct Cervical Spine Wo Contrast  Result Date: 04/24/2018 CLINICAL DATA:  84 year old female with a history of fall and neck pain EXAM: CT CERVICAL SPINE WITHOUT CONTRAST  TECHNIQUE: Multidetector CT imaging of the cervical spine was performed without intravenous contrast. Multiplanar CT image reconstructions were also generated. COMPARISON:  No prior cervical CT FINDINGS: Alignment: 3 mm of anterolisthesis of C4 on C5 without associated fracture or facet displacement. Trace anterolisthesis of T2 on T3 without associated fracture or facet displacement. Relative straightening of the normal cervical lordosis. Skull base and vertebrae: No acute fracture line identified. Craniocervical junction maintains alignment. Facets remain aligned. Irregularity of the dens with associated pannus formation and no fracture line identified. Soft tissues and spinal canal: No canal hematoma. Unremarkable appearance of the paraspinal soft tissues. Disc levels:  Multilevel degenerative changes of the cervical spine. Most advanced degenerative changes at the level of C5-C6, C6-C7, C7-T1 with disc space narrowing, endplate sclerosis, vacuum disc phenomenon and associated uncovertebral joint disease. More mild degenerative changes at C2-C3, C3-C4, and C4-C5. Upper chest: Calcifications of the aortic arch. Small left pleural effusion. Other: None IMPRESSION: CT is negative for acute fracture or malalignment of the cervical spine. Approximately 3 mm of anterolisthesis of C4 on C5, favored to be degenerative with no acute fracture identified. Advanced degenerative disc disease of the cervical spine which is most pronounced in the C5-T1 levels. Irregularity of the dens favored to be degenerative with the pannus formation. Small left pleural effusion incidentally imaged. Electronically Signed   By: Gilmer Mor D.O.   On: 04/24/2018 17:01   Dg Humerus Right  Result Date: 04/24/2018 CLINICAL DATA:  84 year old who fell at the nursing home while going to the bathroom earlier today. Initial encounter. EXAM: RIGHT HUMERUS - 2+ VIEW COMPARISON:  RIGHT shoulder x-ray obtained concurrently. No prior humerus x-rays.  FINDINGS: Severe osseous demineralization. No evidence of acute fracture involving the humerus. Degenerative changes involving the elbow joint. IMPRESSION: No acute osseous abnormality. Electronically Signed   By: Hulan Saas M.D.   On: 04/24/2018 17:13    ____________________________________________   PROCEDURES  Procedure(s) performed: None  Procedures  Critical Care performed: No ____________________________________________   INITIAL IMPRESSION / ASSESSMENT AND PLAN / ED COURSE  Pertinent labs & imaging results that were available during my care of the patient were reviewed by me and considered in my medical decision making (see chart for details).  85 y.o. female with a history of dementia, unable to give a history about today but appears to have a fall with a  head injury.  Overall, the patient was initially mildly hypertensive at 142/97 with a heart rate of 139 on arrival but on my examination she has no normal rate and is in atrial fibrillation, which is chronic for her.  The patient is afebrile.  The etiology of the patient's fall is unclear although it is possible that is mechanical.  We will proceed with a more in-depth evaluation given that we are unable to get a true history.  Basic laboratory studies are pending, including troponin as well as catheterized urinalysis.  The patient does have a CT scan which does not show any acute intracranial injury.  In addition, will get a CT of her neck, and x-ray of her lumbar spine, and right shoulder and upper extremity x-ray.  Patient will be given Tylenol for her discomfort.  Plan reevaluation for final disposition.  ----------------------------------------- 6:27 PM on 04/24/2018 -----------------------------------------  The patient's work-up in the emergency department has been reassuring.  She does not have UTI.  Her electrolytes are within normal limits, and her blood counts are normal as well.  Her EKG does not show any  ischemic changes and her troponin is negative.  Her trauma work-up is also reassuring with a CT scan that does not show any acute intracranial abnormalities, and right shoulder and humerus x-rays that do not show fracture.  The patient's CT of the cervical spine and x-ray of the lumbar spine show significant degenerative changes but no acute fractures.  At this time, the patient is able to eat and drink and is at her normal baseline in terms of mental status and disposition.  She is safe for discharge home.  I have discussed return precautions as well as follow-up instructions with the patient and her family member.  ____________________________________________  FINAL CLINICAL IMPRESSION(S) / ED DIAGNOSES  Final diagnoses:  Acute pain of right shoulder  Fall, initial encounter  Contusion of forehead, initial encounter  Hypertension, unspecified type         NEW MEDICATIONS STARTED DURING THIS VISIT:  New Prescriptions   No medications on file      Rockne MenghiniNorman, Anne-Caroline, MD 04/24/18 [phone]

## 2018-04-24 NOTE — ED Notes (Signed)
Pt back from x-ray.

## 2018-04-24 NOTE — Discharge Instructions (Signed)
For your right shoulder pain, you may use the sling for comfort.  In addition, you may continue to take Tylenol as needed.  Today, your blood pressure was high.  You took your home metoprolol medication, and should not take your evening dose when you arrive at [facility].  You have a contusion on your forehead.  It may swell, become discolored, and you may see some discoloration that goes down into your face.  You may apply ice for any pain or swelling.  Return to the emergency department if you develop severe pain, vomiting, changes in mental status, or any other symptoms concerning to you.

## 2018-04-24 NOTE — ED Notes (Signed)
Pt gone to bathroom [facility]

## 2018-04-24 NOTE — ED Triage Notes (Signed)
Pt comes via [facility] from [facility] with c/o with fall today. Per EMS pt was going to bathroom and fell face first. Pt is on eliquis and has redness noted to right side of forehead. Pt is alert and oriented. Pt states 10/10 pain to right shoulder area. Pt unsure if she loss consciousness. Pt also states that she believed she slipped on urine on the floor.

## 2018-04-24 NOTE — ED Notes (Signed)
Attempted in and out cath pt is unable to give sample at this time

## 2018-05-25 ENCOUNTER — Ambulatory Visit (INDEPENDENT_AMBULATORY_CARE_PROVIDER_SITE_OTHER): Payer: Medicare Other | Admitting: Cardiovascular Disease

## 2018-05-25 ENCOUNTER — Encounter: Payer: Self-pay | Admitting: Cardiovascular Disease

## 2018-05-25 VITALS — BP 104/60 | HR 85 | Ht 60.0 in | Wt 178.0 lb

## 2018-05-25 DIAGNOSIS — I5022 Chronic systolic (congestive) heart failure: Secondary | ICD-10-CM

## 2018-05-25 DIAGNOSIS — I872 Venous insufficiency (chronic) (peripheral): Secondary | ICD-10-CM | POA: Diagnosis not present

## 2018-05-25 DIAGNOSIS — I482 Chronic atrial fibrillation, unspecified: Secondary | ICD-10-CM

## 2018-05-25 DIAGNOSIS — E039 Hypothyroidism, unspecified: Secondary | ICD-10-CM

## 2018-05-25 DIAGNOSIS — I10 Essential (primary) hypertension: Secondary | ICD-10-CM

## 2018-05-25 DIAGNOSIS — J90 Pleural effusion, not elsewhere classified: Secondary | ICD-10-CM

## 2018-05-25 MED ORDER — TORSEMIDE 20 MG PO TABS: 20.0000 mg | ORAL_TABLET | Freq: Two times a day (BID) | ORAL | 3 refills | Status: DC

## 2018-05-25 MED ORDER — APIXABAN 5 MG PO TABS: 5.0000 mg | ORAL_TABLET | Freq: Two times a day (BID) | ORAL | 3 refills | Status: AC

## 2018-05-25 NOTE — Progress Notes (Signed)
Cardiology Office Note   Date:  05/25/2018   ID:  Cheryl Winters, DOB [birth year], MRN [id number]  PCP:  Patient, No Pcp Per  Cardiologist:   Lorine Bears, MD   Chief Complaint  Patient presents with  . OTHER    3 month f/u c/o frequent heart burn, outburst and fluid retention/weeping/edema legs.  Meds reviewed verbally with pt.      History of Present Illness: Cheryl Winters is a 84 y.o. female who presents for a follow-up regarding persistent atrial fibrillation, chronic systolic heart failure, left pleural effusion and history of bradycardia.   She has chronic medical conditions that include hypertension, dementia, hyperlipidemia and frequent UTI. She had previous bradycardia in 2016 that improved after stopping propranolol.  Most recent echocardiogram in January 2019 showed an EF of 30 to 35%, moderate to severe mitral regurgitation, severely dilated left atrium, moderate tricuspid regurgitation and moderate pulmonary hypertension with peak systolic pressure of 55 mmHg. She had recurrent left-sided pleural effusion that required thoracentesis twice.   She had recurrent admissions for heart failure as well as leg edema and cellulitis.  She does have dementia with gradual decline in overall functional capacity.  She is staying at [facility]. She had a fall last month but fortunately without significant injuries. Unfortunately, her dementia and agitation seems to be getting worse.  She is a poor historian and denies any chest pain or shortness of breath.  Leg edema has been worsening with significant weight gain.  She is currently on furosemide 20 mg twice daily. We discontinued amiodarone few months ago given that she now has chronic atrial fibrillation.  Past Medical History:  Diagnosis Date  . Abdominal pain, right upper quadrant   . Benign essential tremor   . Carpal tunnel syndrome   . Chronic combined systolic and diastolic CHF (congestive heart failure) (HCC)    a. TTE 10/2017: EF 30-35%, diffuse HK, not technically sufficient to allow for LV diastolic function, moderate to severe MR, severely dilated left atrium measuring 54 mm, moderately dilated RV with mild systolic reduction, mildly dilated right atrium, moderate TR, PASP 55 mmHg, moderate left-sided pleural effusion  . Dementia    a. mixed vascular and Alzheimer's  . DJD (degenerative joint disease) of knee   . GERD (gastroesophageal reflux disease)   . History of stress test    a. 10/2015 showed no evidence of ischemia, EF 62%, low risk study  . HTN (hypertension)   . Hyperlipidemia   . Persistent atrial fibrillation (HCC)    a. diagnosed 10/2017; b. CHADS2VASc => 6 (CHF, HTN, age x 2, vascular disease, female); c. on Eliquis  . Syncope and collapse   . Systolic CHF (HCC)   . Ulnar nerve lesion   . UTI (urinary tract infection)   . Vitamin D deficiency     Past Surgical History:  Procedure Laterality Date  . Bilateral Bletharoplastices    . BREAST BIOPSY  1957 and 1962   x 2, Nml per pt  . BREAST CYST EXCISION     x 2 - nml path per pt  . CARPAL TUNNEL RELEASE  2004  . CATARACT EXTRACTION    . TOTAL KNEE ARTHROPLASTY     bilateral, total, Marcy Panning  . TOTAL SHOULDER REPLACEMENT  08/2010   left  . TUBAL LIGATION    . UMBILICAL HERNIA REPAIR    . VAGINAL DELIVERY     1 set Twins/2 single births     Current  Outpatient Medications  Medication Sig Dispense Refill  . alum & mag hydroxide-simeth (MAALOX/MYLANTA) 200-200-20 MG/5ML suspension Take 30 mLs by mouth every 6 (six) hours as needed for indigestion or heartburn. 355 mL 0  . ammonium lactate (LAC-HYDRIN) 12 % lotion Apply 1 application topically 2 (two) times daily. Apply to lower leg    . apixaban (ELIQUIS) 2.5 MG TABS tablet Take 2.5 mg by mouth 2 (two) times daily.    . Cyanocobalamin (B-12) 1000 MCG TABS Take 1,000 mcg by mouth daily.    Marland Kitchen. escitalopram (LEXAPRO) 5 MG tablet Take 5 mg by mouth daily.    . feeding  supplement, ENSURE ENLIVE, (ENSURE ENLIVE) LIQD Take 237 mLs by mouth 2 (two) times daily between meals. 237 mL 12  . furosemide (LASIX) 20 MG tablet Take 1 tablet (20 mg total) by mouth 2 (two) times daily. 40 mg in am and 20 mg at 1400 daily (Patient taking differently: Take 20 mg by mouth 2 (two) times daily. ) 180 tablet 0  . levothyroxine (SYNTHROID, LEVOTHROID) 50 MCG tablet Take 50 mcg by mouth daily before breakfast.    . Melatonin 1 MG TABS Take 1 mg by mouth at bedtime.    . metoprolol tartrate (LOPRESSOR) 25 MG tablet Take 1 tablet (25 mg total) by mouth 2 (two) times daily. 180 tablet 1  . neomycin-bacitracin-polymyxin (NEOSPORIN) [phone] ointment Apply topically daily.    . potassium chloride (K-DUR,KLOR-CON) 10 MEQ tablet Take 1 tablet (10 mEq total) by mouth 2 (two) times daily. 180 tablet 3  . risperiDONE (RISPERDAL) 0.5 MG tablet Take 1 tablet (0.5 mg total) by mouth 2 (two) times daily as needed (aggitation). (Patient taking differently: Take 0.5 mg by mouth daily as needed (aggitation). ) 60 tablet 0   No current facility-administered medications for this visit.     Allergies:   Amlodipine; Amoxicillin-pot clavulanate; Penicillins; Codeine; and Codeine sulfate    Social History:  The patient  reports that she has never smoked. She has never used smokeless tobacco. She reports that she does not drink alcohol or use drugs.   Family History:  The patient's family history includes Alcohol abuse in her other; Arthritis in her mother; Brain cancer (age of onset: 8542) in her father; Breast cancer in her maternal aunt; Dementia in her mother; Early death (age of onset: 1742) in her father; Hypertension in her mother; Stroke in her mother.    ROS:  Please see the history of present illness.   Otherwise, review of systems are positive for none.   All other systems are reviewed and negative.    PHYSICAL EXAM: VS:  BP 104/60 (BP Location: Left Arm, Patient Position: Sitting, Cuff Size:  Normal)   Pulse 85   Ht 5' (1.524 m)   Wt 178 lb (80.7 kg)   BMI 34.76 kg/m  , BMI Body mass index is 34.76 kg/m. GEN: Well nourished, well developed, in no acute distress  HEENT: normal  Neck: Mild JVD, carotid bruits, or masses Cardiac: Irregularly irregular ; no murmurs, rubs, or gallops, mild bilateral leg edema with significant stasis dermatitis and ulceration. Respiratory: Diminished breath sound at the left base with few crackles at the right base, normal work of breathing GI: soft, nontender, nondistended, + BS MS: no deformity or atrophy  Skin: warm and dry, no rash Neuro:  Strength and sensation are intact Psych: euthymic mood, full affect   EKG:  EKG is ordered today. EKG showed atrial fibrillation with ventricular rate of  85 bpm and poor R wave progression in the anterior leads.  Recent Labs: 11/14/2017: B Natriuretic Peptide 648.0 12/28/2017: ALT 19; Magnesium 1.9; TSH 22.490 04/24/2018: BUN 14; Creatinine, Ser 0.82; Hemoglobin 12.2; Platelets 208; Potassium 3.5; Sodium 140    Lipid Panel    Component Value Date/Time   CHOL 245 (H) 07/15/2017 0817   TRIG 59.0 07/15/2017 0817   HDL 64.10 07/15/2017 0817   CHOLHDL 4 07/15/2017 0817   VLDL 11.8 07/15/2017 0817   LDLCALC 169 (H) 07/15/2017 0817   LDLDIRECT 123.9 08/27/2013 0814      Wt Readings from Last 3 Encounters:  05/25/18 178 lb (80.7 kg)  04/24/18 140 lb (63.5 kg)  02/14/18 160 lb 12 oz (72.9 kg)        ASSESSMENT AND PLAN:  1.  Chronic atrial fibrillation: Ventricular rate is reasonably controlled on metoprolol.  Appropriate dose of Eliquis is 5 mg twice daily and thus we will communicate with the nursing home about this change.  2.  Recurrent left pleural effusion:  Status post thoracentesis twice.  No recent recurrence.  3.  Chronic systolic heart failure she appears to be volume overloaded with significant weight gain.  I elected to switch furosemide to torsemide 20 mg twice daily.  Check CBC  and basic metabolic profile in 1 week. The patient is not a candidate for any ischemic cardiac work-up given her age and dementia.  4.  Chronic venous insufficiency with ulceration: Continue to use knee-high support stockings during the day with leg elevation.  5.  Hypothyroidism: Most recent TSH was elevated in March.  I requested TSH to be done with her next labs.  She is on replacement therapy.   Disposition:   Follow-up with me in 3 months.  Signed,  Lorine Bears, MD  05/25/2018 9:20 AM    [facility]

## 2018-05-25 NOTE — Patient Instructions (Addendum)
Medication Instructions: INCREASE the Eliquis to 5 mg twice daily STOP the Furosemide START the Torsemide 20 mg twice daily  If you need a refill on your cardiac medications before your next appointment, please call your pharmacy.   Labwork: Your provider would like for you to return in one week to have the following labs drawn: BMET, CBC and TSH.   Follow-Up: Your physician wants you to follow-up in 3 months with Dr. Kirke CorinArida   Thank you [facility]!

## 2018-05-25 NOTE — Telephone Encounter (Signed)
This encounter was created in error - please disregard.

## 2018-06-05 ENCOUNTER — Telehealth: Payer: Self-pay | Admitting: Cardiovascular Disease

## 2018-06-05 NOTE — Telephone Encounter (Signed)
Pt daughter states there is a medication pt is taking for her anxiety. She asks if they can get a referral for a psychiatrist . She currently sees a psychiatrist at the [facility] but would like to switch

## 2018-06-06 NOTE — Telephone Encounter (Signed)
Called returned to the patient's daughter. She stated that her problem had been resolved.

## 2018-06-22 ENCOUNTER — Emergency Department: Payer: Medicare Other

## 2018-06-22 ENCOUNTER — Emergency Department
Admission: EM | Admit: 2018-06-22 | Discharge: 2018-06-22 | Disposition: A | Discharge status: Home / Self Care | Payer: Medicare Other | Attending: Emergency Medicine | Admitting: Emergency Medicine

## 2018-06-22 ENCOUNTER — Other Ambulatory Visit: Payer: Self-pay

## 2018-06-22 DIAGNOSIS — I5042 Chronic combined systolic (congestive) and diastolic (congestive) heart failure: Secondary | ICD-10-CM | POA: Diagnosis not present

## 2018-06-22 DIAGNOSIS — Z7901 Long term (current) use of anticoagulants: Secondary | ICD-10-CM | POA: Insufficient documentation

## 2018-06-22 DIAGNOSIS — J181 Lobar pneumonia, unspecified organism: Secondary | ICD-10-CM | POA: Diagnosis not present

## 2018-06-22 DIAGNOSIS — R0789 Other chest pain: Secondary | ICD-10-CM | POA: Diagnosis not present

## 2018-06-22 DIAGNOSIS — I110 Hypertensive heart disease with heart failure: Secondary | ICD-10-CM | POA: Insufficient documentation

## 2018-06-22 DIAGNOSIS — N3090 Cystitis, unspecified without hematuria: Secondary | ICD-10-CM | POA: Insufficient documentation

## 2018-06-22 DIAGNOSIS — Z79899 Other long term (current) drug therapy: Secondary | ICD-10-CM | POA: Insufficient documentation

## 2018-06-22 DIAGNOSIS — J189 Pneumonia, unspecified organism: Secondary | ICD-10-CM

## 2018-06-22 DIAGNOSIS — R0981 Nasal congestion: Secondary | ICD-10-CM | POA: Diagnosis present

## 2018-06-22 LAB — CBC WITH DIFFERENTIAL/PLATELET
BASOS PCT: 1 %
Basophils Absolute: 0 10*3/uL (ref 0–0.1)
EOS ABS: 0.1 10*3/uL (ref 0–0.7)
EOS PCT: 3 %
HCT: 37.8 % (ref 35.0–47.0)
Hemoglobin: 12.5 g/dL (ref 12.0–16.0)
LYMPHS ABS: 1 10*3/uL (ref 1.0–3.6)
Lymphocytes Relative: 21 %
MCH: 28.6 pg (ref 26.0–34.0)
MCHC: 33 g/dL (ref 32.0–36.0)
MCV: 86.6 fL (ref 80.0–100.0)
MONOS PCT: 10 %
Monocytes Absolute: 0.5 10*3/uL (ref 0.2–0.9)
NEUTROS PCT: 65 %
Neutro Abs: 3.1 10*3/uL (ref 1.4–6.5)
PLATELETS: 173 10*3/uL (ref 150–440)
RBC: 4.37 MIL/uL (ref 3.80–5.20)
RDW: 15.6 % — ABNORMAL HIGH (ref 11.5–14.5)
WBC: 4.7 10*3/uL (ref 3.6–11.0)

## 2018-06-22 LAB — COMPREHENSIVE METABOLIC PANEL WITH GFR
ALT: 16 U/L (ref 0–44)
AST: 19 U/L (ref 15–41)
Albumin: 3.9 g/dL (ref 3.5–5.0)
Alkaline Phosphatase: 78 U/L (ref 38–126)
Anion gap: 11 (ref 5–15)
BUN: 19 mg/dL (ref 8–23)
CO2: 33 mmol/L — ABNORMAL HIGH (ref 22–32)
Calcium: 9.3 mg/dL (ref 8.9–10.3)
Chloride: 100 mmol/L (ref 98–111)
Creatinine, Ser: 0.96 mg/dL (ref 0.44–1.00)
GFR calc Af Amer: >60 mL/min
GFR calc non Af Amer: 54 mL/min — ABNORMAL LOW
Glucose, Bld: 91 mg/dL (ref 70–99)
Potassium: 3.5 mmol/L (ref 3.5–5.1)
Sodium: 144 mmol/L (ref 135–145)
Total Bilirubin: 1 mg/dL (ref 0.3–1.2)
Total Protein: 7.6 g/dL (ref 6.5–8.1)

## 2018-06-22 LAB — URINALYSIS, ROUTINE W REFLEX MICROSCOPIC
BILIRUBIN URINE: NEGATIVE
Glucose, UA: NEGATIVE mg/dL
HGB URINE DIPSTICK: NEGATIVE
Ketones, ur: NEGATIVE mg/dL
Nitrite: NEGATIVE
PH: 7 (ref 5.0–8.0)
Protein, ur: NEGATIVE mg/dL
SPECIFIC GRAVITY, URINE: 1.013 (ref 1.005–1.030)
WBC, UA: >50 WBC/hpf — ABNORMAL HIGH (ref 0–5)

## 2018-06-22 LAB — TROPONIN I: Troponin I: <0.03 ng/mL

## 2018-06-22 LAB — BRAIN NATRIURETIC PEPTIDE: B Natriuretic Peptide: 638 pg/mL — ABNORMAL HIGH (ref 0.0–100.0)

## 2018-06-22 MED ORDER — CEFDINIR 300 MG PO CAPS: 300.0000 mg | ORAL_CAPSULE | Freq: Two times a day (BID) | ORAL | 0 refills | Status: DC

## 2018-06-22 MED ORDER — MAGNESIUM SULFATE 4 GM/100ML IV SOLN: 4.0000 g | Freq: Once | INTRAVENOUS | Status: DC

## 2018-06-22 NOTE — ED Notes (Signed)
Patient transported to CT 

## 2018-06-22 NOTE — ED Triage Notes (Signed)
Pt to the er for [facility] for chest pain. Pt reports more flutter than pain. Pt has a hx of afib ans 12 lead shows same with pvcs. Pt says she is sort of breath. Sats are good.

## 2018-06-22 NOTE — ED Notes (Signed)
Pt assisted into car with this nurse present. Pt steady on feet and this nurse stood by as support.

## 2018-06-22 NOTE — ED Provider Notes (Signed)
[facility] Emergency Department Provider Note  ____________________________________________  Time seen: Approximately 7:48 AM  I have reviewed the triage vital signs and the nursing notes.   HISTORY  Chief Complaint Chest Pain  Level 5 Caveat: Portions of the History and Physical including HPI and review of systems are unable to be completely obtained due to patient being a poor historian due to chronic dementia   HPI Cheryl Winters is a 84 y.o. female with a history of hypertension hyperlipidemia persistent atrial fibrillation and dementia who was sent to the ED for evaluation of reported chest pain.  To me the patient's main complaint is nasal congestion.  She also reports generalized intermittent body aches which also include chest discomfort.  She denies chest pain specifically.  She does feel slightly more short of breath than usual but she is also unsure.  Thinks that her symptoms started within the last few days.  Denies exertional symptoms.  Not pleuritic.   Saw cardiology Dr. Kirke Corin about 4 weeks ago, assessment of chronic medical conditions is as follows. 1.  Chronic atrial fibrillation: Ventricular rate is reasonably controlled on metoprolol.  Appropriate dose of Eliquis is 5 mg twice daily and thus we will communicate with the nursing home about this change.  2.  Recurrent left pleural effusion:  Status post thoracentesis twice.  No recent recurrence.  3.  Chronic systolic heart failure she appears to be volume overloaded with significant weight gain.  I elected to switch furosemide to torsemide 20 mg twice daily.  Check CBC and basic metabolic profile in 1 week. The patient is not a candidate for any ischemic cardiac work-up given her age and dementia.  4.  Chronic venous insufficiency with ulceration: Continue to use knee-high support stockings during the day with leg elevation.  5.  Hypothyroidism: Most recent TSH was elevated in March.  I  requested TSH to be done with her next labs.  She is on replacement therapy.   Past Medical History:  Diagnosis Date  . Abdominal pain, right upper quadrant   . Benign essential tremor   . Carpal tunnel syndrome   . Chronic combined systolic and diastolic CHF (congestive heart failure) (HCC)    a. TTE 10/2017: EF 30-35%, diffuse HK, not technically sufficient to allow for LV diastolic function, moderate to severe MR, severely dilated left atrium measuring 54 mm, moderately dilated RV with mild systolic reduction, mildly dilated right atrium, moderate TR, PASP 55 mmHg, moderate left-sided pleural effusion  . Dementia    a. mixed vascular and Alzheimer's  . DJD (degenerative joint disease) of knee   . GERD (gastroesophageal reflux disease)   . History of stress test    a. 10/2015 showed no evidence of ischemia, EF 62%, low risk study  . HTN (hypertension)   . Hyperlipidemia   . Persistent atrial fibrillation (HCC)    a. diagnosed 10/2017; b. CHADS2VASc => 6 (CHF, HTN, age x 2, vascular disease, female); c. on Eliquis  . Syncope and collapse   . Systolic CHF (HCC)   . Ulnar nerve lesion   . UTI (urinary tract infection)   . Vitamin D deficiency      Patient Active Problem List   Diagnosis Date Noted  . Acute on chronic systolic CHF (congestive heart failure), NYHA class 3 (HCC) 12/28/2017  . Somnolence 12/28/2017  . Orthostatic hypotension 11/11/2017  . Dilated cardiomyopathy (HCC) 11/03/2017  . Dementia 11/03/2017  . Pleural effusion 11/02/2017  . A-fib (HCC) 11/01/2017  .  Atrial fibrillation with RVR (HCC) 11/01/2017  . Memory change 06/27/2016  . Acute cystitis without hematuria 11/03/2015  . Lightheadedness 10/31/2015  . Bradycardia 09/19/2014  . Health care maintenance 09/02/2014  . Medicare annual wellness visit, subsequent 08/21/2012  . Tremor, essential 08/21/2012  . Hypertension 11/22/2011  . Hyperlipidemia 11/22/2011     Past Surgical History:  Procedure  Laterality Date  . Bilateral Bletharoplastices    . BREAST BIOPSY  1957 and 1962   x 2, Nml per pt  . BREAST CYST EXCISION     x 2 - nml path per pt  . CARPAL TUNNEL RELEASE  2004  . CATARACT EXTRACTION    . TOTAL KNEE ARTHROPLASTY     bilateral, total, Marcy Panning  . TOTAL SHOULDER REPLACEMENT  08/2010   left  . TUBAL LIGATION    . UMBILICAL HERNIA REPAIR    . VAGINAL DELIVERY     1 set Twins/2 single births     Prior to Admission medications   Medication Sig Start Date End Date Taking? Authorizing Provider  ammonium lactate (LAC-HYDRIN) 12 % lotion Apply 1 application topically 2 (two) times daily. Apply to lower leg   Yes [provider]  apixaban (ELIQUIS) 5 MG TABS tablet Take 1 tablet (5 mg total) by mouth 2 (two) times daily. 05/25/18  Yes Iran Ouch, MD  Cyanocobalamin (B-12) 1000 MCG TABS Take 1,000 mcg by mouth daily.   Yes [provider]  escitalopram (LEXAPRO) 5 MG tablet Take 5 mg by mouth daily.   Yes [provider]  levothyroxine (SYNTHROID, LEVOTHROID) 50 MCG tablet Take 50 mcg by mouth daily before breakfast.   Yes [provider]  Melatonin 1 MG TABS Take 1 mg by mouth at bedtime.   Yes [provider]  metoprolol tartrate (LOPRESSOR) 25 MG tablet Take 1 tablet (25 mg total) by mouth 2 (two) times daily. 01/10/18 04/25/19 Yes Dunn, Raymon Mutton, PA-C  neomycin-bacitracin-polymyxin (NEOSPORIN) [phone] ointment Apply topically daily.   Yes [provider]  potassium chloride (K-DUR,KLOR-CON) 10 MEQ tablet Take 1 tablet (10 mEq total) by mouth 2 (two) times daily. 11/17/17  Yes Iran Ouch, MD  risperiDONE (RISPERDAL) 0.5 MG tablet Take 1 tablet (0.5 mg total) by mouth 2 (two) times daily as needed (aggitation). Patient taking differently: Take 0.5 mg by mouth daily as needed (aggitation).  12/30/17  Yes Adrian Saran, MD  alum & mag hydroxide-simeth (MAALOX/MYLANTA) 200-200-20 MG/5ML suspension Take 30 mLs by  mouth every 6 (six) hours as needed for indigestion or heartburn. Patient not taking: Reported on 06/22/2018 11/04/17   Shaune Pollack, MD  cefdinir (OMNICEF) 300 MG capsule Take 1 capsule (300 mg total) by mouth 2 (two) times daily. 06/22/18   Sharman Cheek, MD  feeding supplement, ENSURE ENLIVE, (ENSURE ENLIVE) LIQD Take 237 mLs by mouth 2 (two) times daily between meals. 12/30/17   Adrian Saran, MD  torsemide (DEMADEX) 20 MG tablet Take 1 tablet (20 mg total) by mouth 2 (two) times daily. Patient not taking: Reported on 06/22/2018 05/25/18 08/23/18  Iran Ouch, MD     Allergies Amlodipine; Amoxicillin-pot clavulanate; Penicillins; Codeine; and Codeine sulfate   Family History  Problem Relation Age of Onset  . Hypertension Mother   . Arthritis Mother   . Stroke Mother   . Dementia Mother   . Early death Father 15       from brain cancer  . Brain cancer Father 8  . Breast cancer  Maternal Aunt   . Alcohol abuse Other     Social History Social History   Tobacco Use  . Smoking status: Never Smoker  . Smokeless tobacco: Never Used  Substance Use Topics  . Alcohol use: No  . Drug use: No    Review of Systems  Constitutional:   No fever or chills.  ENT:   No sore throat.  Positive nasal congestion Cardiovascular:   No chest pain or syncope. Respiratory:   Positive worsening of chronic shortness of breath, no cough. Gastrointestinal:   Negative for abdominal pain, vomiting and diarrhea.  Musculoskeletal:   Chronic leg swelling, improved from previous All other systems reviewed and are negative except as documented above in ROS and HPI.  ____________________________________________   PHYSICAL EXAM:  VITAL SIGNS: ED Triage Vitals  Enc Vitals Group     BP 06/22/18 0636 131/88     Pulse Rate 06/22/18 0636 (!) 115     Resp 06/22/18 0636 18     Temp 06/22/18 0636 97.8 F (36.6 C)     Temp Source 06/22/18 0636 Oral     SpO2 06/22/18 0636 97 %     Weight 06/22/18 0631 179  lb 14.3 oz (81.6 kg)     Height 06/22/18 0631 5' (1.524 m)     Head Circumference --      Peak Flow --      Pain Score 06/22/18 0632 0     Pain Loc --      Pain Edu? --      Excl. in GC? --     Vital signs reviewed, nursing assessments reviewed.   Constitutional:   Alert and oriented to person and place. Non-toxic appearance. Eyes:   Conjunctivae are normal. EOMI. PERRL. ENT      Head:   Normocephalic and atraumatic.      Nose:   No rhinnorhea.       Mouth/Throat:   Dry mucous membranes, no pharyngeal erythema. No peritonsillar mass.       Neck:   No meningismus. Full ROM. Hematological/Lymphatic/Immunilogical:   No cervical lymphadenopathy. Cardiovascular:   Irregularly irregular rhythm, rate controlled with a rate of 85-105. Symmetric bilateral radial and DP pulses.  No murmurs. Cap refill less than 2 seconds. Respiratory:   Normal respiratory effort without tachypnea/retractions. Breath sounds are clear and equal bilaterally. No wheezes/rales/rhonchi. Gastrointestinal:   Soft and nontender. Non distended. There is no CVA tenderness.  No rebound, rigidity, or guarding. Musculoskeletal:   Normal range of motion in all extremities. No joint effusions.  No lower extremity tenderness.  2+ pitting edema bilateral lower extremities, symmetric, nontender.  Compression stockings on.. Neurologic:   Normal speech, limited language.  Motor grossly intact. No acute focal neurologic deficits are appreciated.  Skin:    Skin is warm, dry and intact. No rash noted.  No petechiae, purpura, or bullae.  ____________________________________________    LABS (pertinent positives/negatives) (all labs ordered are listed, but only abnormal results are displayed) Labs Reviewed  COMPREHENSIVE METABOLIC PANEL - Abnormal; Notable for the following components:      Result Value   CO2 33 (*)    GFR calc non Af Amer 54 (*)    All other components within normal limits  BRAIN NATRIURETIC PEPTIDE -  Abnormal; Notable for the following components:   B Natriuretic Peptide 638.0 (*)    All other components within normal limits  CBC WITH DIFFERENTIAL/PLATELET - Abnormal; Notable for the following components:   RDW 15.6 (*)  All other components within normal limits  URINALYSIS, ROUTINE W REFLEX MICROSCOPIC - Abnormal; Notable for the following components:   Color, Urine YELLOW (*)    APPearance HAZY (*)    Leukocytes, UA LARGE (*)    WBC, UA >50 (*)    Bacteria, UA FEW (*)    Non Squamous Epithelial PRESENT (*)    All other components within normal limits  URINE CULTURE  TROPONIN I  TROPONIN I   ____________________________________________   EKG  Interpreted by me Atrial fibrillation rate 117, normal axis, prolonged QTC of 5 2 1  ms, normal QRS ST segments and T waves.  ____________________________________________    RADIOLOGY  Ct Chest Wo Contrast  Result Date: 06/22/2018 CLINICAL DATA:  Chest pain, shortness of breath, fluttering of the chest, history of atrial fibrillation EXAM: CT CHEST WITHOUT CONTRAST TECHNIQUE: Multidetector CT imaging of the chest was performed following the standard protocol without IV contrast. COMPARISON:  Portable chest x-ray of 06/22/2018 FINDINGS: Cardiovascular: Moderate thoracic aortic atherosclerosis is present. Coronary artery calcifications are present in the region of the left anterior descending coronary artery. There is cardiomegaly present only a tiny pericardial effusion present. The mid ascending thoracic aorta measures 37 mm in diameter. Mediastinum/Nodes: On this unenhanced study, no mediastinal or hilar adenopathy is seen. There is a moderate size hiatal hernia present. Lungs/Pleura: A small calcified granuloma is present within the right upper lobe from prior granulomatous disease. There is collapse of the left lower lobe with air bronchograms coursing through this region most consistent with left lower lobe pneumonia. Underlying tumor  cannot be excluded. A small left pleural effusion is noted posteriorly. Upper Abdomen: The portion of the upper abdomen that is visualized is unremarkable. Again there is is abdominal AA of aortic atherosclerosis noted. Musculoskeletal: There is a thoracic scoliosis present convex to the right and there are diffuse degenerative changes noted. No compression deformity is seen. IMPRESSION: 1. Left lower lobe collapse with air bronchograms coursing through this region most consistent with left lower lobe atelectasis and possible pneumonia with left pleural effusion. 2. Moderate thoracic and upper abdominal aortic atherosclerosis with coronary artery calcifications present as well. 3. Tiny pericardial effusion with cardiomegaly. 4. Moderate size hiatal hernia. 5. Thoracic scoliosis convex to the right with diffuse degenerative change. Electronically Signed   By: Dwyane Dee M.D.   On: 06/22/2018 10:41   Dg Chest Port 1 View  Result Date: 06/22/2018 CLINICAL DATA:  Acute chest pain.  Shortness of breath. EXAM: PORTABLE CHEST 1 VIEW COMPARISON:  12/30/2017. FINDINGS: Severe cardiomegaly again noted. Normal pulmonary vascularity. Dense left base atelectasis/consolidation and prominent left pleural effusion. Mild right upper lung infiltrate cannot be excluded. Degenerative changes scoliosis thoracic spine. Left shoulder replacement. IMPRESSION: 1. Dense left base atelectasis/consolidation and prominent left pleural effusion noted on today's exam. Mild right upper lung infiltrate cannot be excluded. 2.  Severe stable cardiomegaly.  No pulmonary venous congestion. Electronically Signed   By: Maisie Fus  Register   On: 06/22/2018 07:34    ____________________________________________   PROCEDURES Procedures  ____________________________________________  DIFFERENTIAL DIAGNOSIS   URI, volume overload, NSTEMI, pneumonia, pleural effusion  CLINICAL IMPRESSION / ASSESSMENT AND PLAN / ED COURSE  Pertinent labs &  imaging results that were available during my care of the patient were reviewed by me and considered in my medical decision making (see chart for details).      Clinical Course as of Jun 22 1109  Thu Jun 22, 2018  6962 P/w nasal congestion, body  aches, intermittent vague chest discomfort similar to rest of body. Denies specific chest pain. Receiving meds through [facility]. Doubt ACS, PE, dissection, AAA, pna, ptx, sepsis, or carditis. Likely viral URI. Will risk stratify with two troponins and plan outpatient f/u if workup is reassuring. Initial BNP baseline. Initial trop negative. EKG nonacute.   [PS]    Clinical Course User Index [PS] Sharman Cheek, MD     ----------------------------------------- 11:09 AM on 06/22/2018 -----------------------------------------  Repeat troponin negative.  Comparing x-ray current versus previous shows increased opacity of left lower lung, concerning for recurrent effusion versus infection.  Therefore a CT of the chest was obtained which does show consolidation and partial collapse of the left lower lung with air bronchograms, concerning for pneumonia.  Small pleural effusion.  I discussed with the daughter at bedside and offered admission for treatment of pneumonia and urinary tract infection, but she declines and prefers to have the patient treated at home.  She notes that the patient does have excellent outpatient resources with house call primary care doctor and visiting home health services.  Return precautions discussed.  ____________________________________________   FINAL CLINICAL IMPRESSION(S) / ED DIAGNOSES    Final diagnoses:  Pneumonia of left lower lobe due to infectious organism [facility])  Cystitis     ED Discharge Orders         Ordered    cefdinir (OMNICEF) 300 MG capsule  2 times daily     06/22/18 1108          Portions of this note were generated with dragon dictation software. Dictation errors may occur despite best  attempts at proofreading.    Sharman Cheek, MD 06/22/18 1110

## 2018-06-25 LAB — URINE CULTURE

## 2018-07-20 ENCOUNTER — Telehealth: Payer: Self-pay | Admitting: Internal Medicine

## 2018-07-20 NOTE — Telephone Encounter (Signed)
Patient daughter declined to schedule appt. Patient is in ALF and appt not needed .    Deleting Recall.

## 2018-08-24 ENCOUNTER — Ambulatory Visit: Payer: Medicare Other | Admitting: Cardiovascular Disease

## 2018-09-12 ENCOUNTER — Telehealth: Payer: Self-pay | Admitting: Cardiovascular Disease

## 2018-09-12 NOTE — Telephone Encounter (Signed)
We can switch metoprolol to propranolol 40 mg twice daily to see if that helps.  Propranolol was discontinued in the past due to bradycardia but she might be able to tolerate this now given that she is in atrial fibrillation.

## 2018-09-12 NOTE — Telephone Encounter (Addendum)
Call from pt daughter about tremors in R hand and head that are making eating and drinking very difficult for patient.  Daughter states that pt has hx of "essentail tremor" and used to be prescribed to medication that helped. Daughter cannot remember the name of it but states that she was taken off of it in Feb 2019 d/t new cardiac issues. I cannot find any related medication that was stopped.  Daughter is requesting medication that would alleviate sx.   11:20 AM Call back from daughter who reports that pt was on Propranolol 60 mg up until Feb 2019 when it was stopped by Dr[facility] Malvin JohnsPotter [facility].

## 2018-09-12 NOTE — Telephone Encounter (Signed)
Pt daughter is calling, states pt tremors has gotten worse and asks if there is a tremor medication pt can be switched to that she can take with her heart medications.

## 2018-09-13 NOTE — Telephone Encounter (Signed)
Daughter called back after talking to sisters and have decided to hold off on medication change for now. They are concerned that in the past she has had difficulty with medication changes with her dementia and report that they will make a decision to change at a later time if tremors worsen.

## 2018-09-13 NOTE — Telephone Encounter (Signed)
Call to daughter with suggestions from Dr. Kirke CorinArida, daughter reports before medications are changed that she wants to consult with sisters.  She said she soul call back today.

## 2018-10-24 ENCOUNTER — Encounter: Payer: Self-pay | Admitting: Emergency Medicine

## 2018-10-24 ENCOUNTER — Other Ambulatory Visit: Payer: Self-pay

## 2018-10-24 ENCOUNTER — Emergency Department
Admission: EM | Admit: 2018-10-24 | Discharge: 2018-10-24 | Disposition: A | Discharge status: Home / Self Care | Payer: Medicare Other | Attending: Emergency Medicine | Admitting: Emergency Medicine

## 2018-10-24 ENCOUNTER — Emergency Department: Payer: Medicare Other

## 2018-10-24 ENCOUNTER — Telehealth: Payer: Self-pay | Admitting: Internal Medicine

## 2018-10-24 ENCOUNTER — Encounter: Payer: Self-pay | Admitting: Cardiovascular Disease

## 2018-10-24 ENCOUNTER — Ambulatory Visit (INDEPENDENT_AMBULATORY_CARE_PROVIDER_SITE_OTHER): Payer: Medicare Other | Admitting: Cardiovascular Disease

## 2018-10-24 VITALS — HR 81 | Ht 60.0 in | Wt 155.5 lb

## 2018-10-24 DIAGNOSIS — R638 Other symptoms and signs concerning food and fluid intake: Secondary | ICD-10-CM | POA: Diagnosis not present

## 2018-10-24 DIAGNOSIS — Z79899 Other long term (current) drug therapy: Secondary | ICD-10-CM | POA: Insufficient documentation

## 2018-10-24 DIAGNOSIS — I110 Hypertensive heart disease with heart failure: Secondary | ICD-10-CM | POA: Insufficient documentation

## 2018-10-24 DIAGNOSIS — I482 Chronic atrial fibrillation, unspecified: Secondary | ICD-10-CM | POA: Diagnosis not present

## 2018-10-24 DIAGNOSIS — I5042 Chronic combined systolic (congestive) and diastolic (congestive) heart failure: Secondary | ICD-10-CM | POA: Insufficient documentation

## 2018-10-24 DIAGNOSIS — Z7901 Long term (current) use of anticoagulants: Secondary | ICD-10-CM | POA: Insufficient documentation

## 2018-10-24 DIAGNOSIS — R634 Abnormal weight loss: Secondary | ICD-10-CM | POA: Insufficient documentation

## 2018-10-24 DIAGNOSIS — F0390 Unspecified dementia without behavioral disturbance: Secondary | ICD-10-CM | POA: Insufficient documentation

## 2018-10-24 DIAGNOSIS — R251 Tremor, unspecified: Secondary | ICD-10-CM | POA: Insufficient documentation

## 2018-10-24 DIAGNOSIS — I5022 Chronic systolic (congestive) heart failure: Secondary | ICD-10-CM

## 2018-10-24 DIAGNOSIS — I9589 Other hypotension: Secondary | ICD-10-CM | POA: Diagnosis not present

## 2018-10-24 DIAGNOSIS — Z96612 Presence of left artificial shoulder joint: Secondary | ICD-10-CM | POA: Insufficient documentation

## 2018-10-24 DIAGNOSIS — E861 Hypovolemia: Secondary | ICD-10-CM

## 2018-10-24 DIAGNOSIS — Z96651 Presence of right artificial knee joint: Secondary | ICD-10-CM | POA: Insufficient documentation

## 2018-10-24 DIAGNOSIS — Z96652 Presence of left artificial knee joint: Secondary | ICD-10-CM | POA: Diagnosis not present

## 2018-10-24 LAB — CBC
HCT: 37 % (ref 36.0–46.0)
HEMOGLOBIN: 12.1 g/dL (ref 12.0–15.0)
MCH: 31 pg (ref 26.0–34.0)
MCHC: 32.7 g/dL (ref 30.0–36.0)
MCV: 94.9 fL (ref 80.0–100.0)
PLATELETS: 210 10*3/uL (ref 150–400)
RBC: 3.9 MIL/uL (ref 3.87–5.11)
RDW: 13.8 % (ref 11.5–15.5)
WBC: 5.8 10*3/uL (ref 4.0–10.5)
nRBC: 0 % (ref 0.0–0.2)

## 2018-10-24 LAB — BASIC METABOLIC PANEL
Anion gap: 8 (ref 5–15)
BUN: 21 mg/dL (ref 8–23)
CALCIUM: 8.9 mg/dL (ref 8.9–10.3)
CO2: 27 mmol/L (ref 22–32)
Chloride: 102 mmol/L (ref 98–111)
Creatinine, Ser: 1.01 mg/dL — ABNORMAL HIGH (ref 0.44–1.00)
GFR calc non Af Amer: 52 mL/min — ABNORMAL LOW (ref >60)
GLUCOSE: 90 mg/dL (ref 70–99)
Potassium: 3.7 mmol/L (ref 3.5–5.1)
Sodium: 137 mmol/L (ref 135–145)

## 2018-10-24 LAB — DIFFERENTIAL
BASOS PCT: 0 %
Basophils Absolute: 0 10*3/uL (ref 0.0–0.1)
EOS PCT: 3 %
Eosinophils Absolute: 0.2 10*3/uL (ref 0.0–0.5)
Lymphocytes Relative: 32 %
Lymphs Abs: 1.9 10*3/uL (ref 0.7–4.0)
MONOS PCT: 9 %
Monocytes Absolute: 0.5 10*3/uL (ref 0.1–1.0)
NEUTROS ABS: 3.2 10*3/uL (ref 1.7–7.7)
Neutrophils Relative %: 55 %

## 2018-10-24 LAB — HEPATIC FUNCTION PANEL
ALK PHOS: 81 U/L (ref 38–126)
ALT: 14 U/L (ref 0–44)
AST: 23 U/L (ref 15–41)
Albumin: 3.4 g/dL — ABNORMAL LOW (ref 3.5–5.0)
BILIRUBIN INDIRECT: 0.7 mg/dL (ref 0.3–0.9)
BILIRUBIN TOTAL: 0.9 mg/dL (ref 0.3–1.2)
Bilirubin, Direct: 0.2 mg/dL (ref 0.0–0.2)
TOTAL PROTEIN: 6.8 g/dL (ref 6.5–8.1)

## 2018-10-24 LAB — URINALYSIS, COMPLETE (UACMP) WITH MICROSCOPIC
BILIRUBIN URINE: NEGATIVE
GLUCOSE, UA: NEGATIVE mg/dL
HGB URINE DIPSTICK: NEGATIVE
Ketones, ur: NEGATIVE mg/dL
NITRITE: NEGATIVE
PH: 6 (ref 5.0–8.0)
Protein, ur: NEGATIVE mg/dL
SPECIFIC GRAVITY, URINE: 1.011 (ref 1.005–1.030)

## 2018-10-24 LAB — TROPONIN I
TROPONIN I: 0.04 ng/mL — AB (ref <0.03)
Troponin I: <0.03 ng/mL (ref <0.03)

## 2018-10-24 MED ORDER — NITROFURANTOIN MONOHYD MACRO 100 MG PO CAPS: 100.0000 mg | ORAL_CAPSULE | Freq: Two times a day (BID) | ORAL | 0 refills | Status: AC

## 2018-10-24 MED ORDER — ALUM & MAG HYDROXIDE-SIMETH 200-200-20 MG/5ML PO SUSP
30.0000 mL | Freq: Once | ORAL | Status: AC
  Administered 2018-10-24: 30 mL via ORAL
  Filled 2018-10-24: qty 30

## 2018-10-24 NOTE — ED Notes (Signed)
Pt urinated in toilet with hat in place; all urine missed hat so family notified to tell this RN next time pt can urinate.

## 2018-10-24 NOTE — ED Provider Notes (Signed)
[facility] Emergency Department Provider Note   ____________________________________________   First MD Initiated Contact with Patient 10/24/18 1731     (approximate)  I have reviewed the triage vital signs and the nursing notes.   HISTORY  Chief Complaint No chief complaint on file. Chief complaint is low blood pressure HPI Cheryl Winters is a 85 y.o. female patient sent from Dr. Ernest Mallick office with a history of losing 24 pounds in the last month or 2 blood pressure that was quite low for her at 106 decreased p.o. intake.  Family reports she had a tremor medicine taken off of her med list sometime ago and her tremor has worsened since then and she is finding it somewhat hard to eat now because of the tremor apparently  Past Medical History:  Diagnosis Date  . Abdominal pain, right upper quadrant   . Benign essential tremor   . Carpal tunnel syndrome   . Chronic combined systolic and diastolic CHF (congestive heart failure) (HCC)    a. TTE 10/2017: EF 30-35%, diffuse HK, not technically sufficient to allow for LV diastolic function, moderate to severe MR, severely dilated left atrium measuring 54 mm, moderately dilated RV with mild systolic reduction, mildly dilated right atrium, moderate TR, PASP 55 mmHg, moderate left-sided pleural effusion  . Dementia (HCC)    a. mixed vascular and Alzheimer's  . DJD (degenerative joint disease) of knee   . GERD (gastroesophageal reflux disease)   . History of stress test    a. 10/2015 showed no evidence of ischemia, EF 62%, low risk study  . HTN (hypertension)   . Hyperlipidemia   . Persistent atrial fibrillation    a. diagnosed 10/2017; b. CHADS2VASc => 6 (CHF, HTN, age x 2, vascular disease, female); c. on Eliquis  . Syncope and collapse   . Systolic CHF (HCC)   . Ulnar nerve lesion   . UTI (urinary tract infection)   . Vitamin D deficiency     Patient Active Problem List   Diagnosis Date Noted  . Acute on  chronic systolic CHF (congestive heart failure), NYHA class 3 (HCC) 12/28/2017  . Somnolence 12/28/2017  . Orthostatic hypotension 11/11/2017  . Dilated cardiomyopathy (HCC) 11/03/2017  . Dementia (HCC) 11/03/2017  . Pleural effusion 11/02/2017  . A-fib (HCC) 11/01/2017  . Atrial fibrillation with RVR (HCC) 11/01/2017  . Memory change 06/27/2016  . Acute cystitis without hematuria 11/03/2015  . Lightheadedness 10/31/2015  . Bradycardia 09/19/2014  . Health care maintenance 09/02/2014  . Medicare annual wellness visit, subsequent 08/21/2012  . Tremor, essential 08/21/2012  . Hypertension 11/22/2011  . Hyperlipidemia 11/22/2011    Past Surgical History:  Procedure Laterality Date  . Bilateral Bletharoplastices    . BREAST BIOPSY  1957 and 1962   x 2, Nml per pt  . BREAST CYST EXCISION     x 2 - nml path per pt  . CARPAL TUNNEL RELEASE  2004  . CATARACT EXTRACTION    . TOTAL KNEE ARTHROPLASTY     bilateral, total, Marcy Panning  . TOTAL SHOULDER REPLACEMENT  08/2010   left  . TUBAL LIGATION    . UMBILICAL HERNIA REPAIR    . VAGINAL DELIVERY     1 set Twins/2 single births    Prior to Admission medications   Medication Sig Start Date End Date Taking? Authorizing Provider  alum & mag hydroxide-simeth (MAALOX/MYLANTA) 200-200-20 MG/5ML suspension Take 30 mLs by mouth every 6 (six) hours as needed for  indigestion or heartburn. 11/04/17  Yes Shaune Pollackhen, Qing, MD  ammonium lactate (LAC-HYDRIN) 12 % lotion Apply 1 application topically 2 (two) times daily. (apply to the lower legs)   Yes [provider]  apixaban (ELIQUIS) 5 MG TABS tablet Take 1 tablet (5 mg total) by mouth 2 (two) times daily. 05/25/18  Yes Iran OuchArida, Muhammad A, MD  Cyanocobalamin (B-12) 1000 MCG TABS Take 1,000 mcg by mouth daily.   Yes [provider]  donepezil (ARICEPT) 5 MG tablet Take 5 mg by mouth at bedtime.   Yes [provider]  escitalopram (LEXAPRO) 5 MG tablet Take 5 mg by mouth daily.    Yes [provider]  feeding supplement, ENSURE ENLIVE, (ENSURE ENLIVE) LIQD Take 237 mLs by mouth 2 (two) times daily between meals. 12/30/17  Yes Mody, Patricia PesaSital, MD  levothyroxine (SYNTHROID, LEVOTHROID) 75 MCG tablet Take 75 mcg by mouth daily before breakfast.    Yes [provider]  Melatonin 5 MG TABS Take 5 mg by mouth at bedtime.    Yes [provider]  metoprolol tartrate (LOPRESSOR) 25 MG tablet Take 1 tablet (25 mg total) by mouth 2 (two) times daily. 01/10/18 04/25/19 Yes Dunn, Raymon Muttonyan M, PA-C  neomycin-bacitracin-polymyxin (NEOSPORIN) [phone] ointment Apply 1 application topically daily.    Yes [provider]  potassium chloride (K-DUR,KLOR-CON) 10 MEQ tablet Take 1 tablet (10 mEq total) by mouth 2 (two) times daily. 11/17/17  Yes Iran OuchArida, Muhammad A, MD  QUEtiapine (SEROQUEL) 25 MG tablet Take 25 mg by mouth at bedtime.   Yes [provider]  risperiDONE (RISPERDAL) 0.5 MG tablet Take 1 tablet (0.5 mg total) by mouth 2 (two) times daily as needed (aggitation). Patient taking differently: Take 0.5 mg by mouth daily as needed (agitation).  12/30/17  Yes Mody, Patricia PesaSital, MD  risperiDONE (RISPERDAL) 0.5 MG tablet Take 0.25 mg by mouth 2 (two) times daily.   Yes [provider]  torsemide (DEMADEX) 20 MG tablet Take 1 tablet (20 mg total) by mouth 2 (two) times daily. 05/25/18 10/24/18 Yes Iran OuchArida, Muhammad A, MD    Allergies Amlodipine; Amoxicillin-pot clavulanate; Penicillins; Codeine; and Codeine sulfate  Family History  Problem Relation Age of Onset  . Hypertension Mother   . Arthritis Mother   . Stroke Mother   . Dementia Mother   . Early death Father [id number]       from brain cancer  . Brain cancer Father [id number]  . Breast cancer Maternal Aunt   . Alcohol abuse Other     Social History Social History   Tobacco Use  . Smoking status: Never Smoker  . Smokeless tobacco: Never Used  Substance Use Topics  . Alcohol use: No  . Drug use: No     Review of Systems  Constitutional: No fever/chills Eyes: No visual changes. ENT: No sore throat. Cardiovascular: Denies chest pain. Respiratory: Denies shortness of breath. Gastrointestinal: No abdominal pain.  No nausea, no vomiting.  No diarrhea.  No constipation. Genitourinary: Negative for dysuria. Musculoskeletal: Negative for back pain. Skin: Negative for rash. Neurological: Negative for headaches, focal weakness   ____________________________________________   PHYSICAL EXAM:  VITAL SIGNS: ED Triage Vitals  Enc Vitals Group     BP 10/24/18 1622 108/66     Pulse Rate 10/24/18 1622 (!) 50     Resp 10/24/18 1622 18     Temp 10/24/18 1622 98.4 F (36.9 C)     Temp Source 10/24/18 1622 Oral     SpO2 10/24/18  1622 96 %     Weight --      Height --      Head Circumference --      Peak Flow --      Pain Score 10/24/18 1630 0     Pain Loc --      Pain Edu? --      Excl. in GC? --     Constitutional: Alert and oriented. Well appearing and in no acute distress. Eyes: Conjunctivae are normal.  Head: Atraumatic. Nose: No congestion/rhinnorhea. Mouth/Throat: Mucous membranes are moist.  Oropharynx non-erythematous. Neck: No stridor Cardiovascular: Normal rate, regular rhythm. Grossly normal heart sounds.  Good peripheral circulation. Respiratory: Normal respiratory effort.  No retractions. Lungs CTAB. Gastrointestinal: Soft and nontender. No distention. No abdominal bruits. No CVA tenderness. Musculoskeletal: No lower extremity tenderness nor edema.  No joint effusions. Neurologic:  Normal speech and language. No gross focal neurologic deficits are appreciated.  Patient has a pronounced resting tremor of the right hand and the lips. Skin:  Skin is warm, dry and intact with some scabbing and crusting on the left ear. No rash noted.   ____________________________________________   LABS (all labs ordered are listed, but only abnormal results are displayed)  Labs  Reviewed  BASIC METABOLIC PANEL - Abnormal; Notable for the following components:      Result Value   Creatinine, Ser 1.01 (*)    GFR calc non Af Amer 52 (*)    All other components within normal limits  TROPONIN I - Abnormal; Notable for the following components:   Troponin I 0.04 (*)    All other components within normal limits  URINALYSIS, COMPLETE (UACMP) WITH MICROSCOPIC - Abnormal; Notable for the following components:   Color, Urine YELLOW (*)    APPearance CLOUDY (*)    Leukocytes, UA MODERATE (*)    Bacteria, UA RARE (*)    Non Squamous Epithelial PRESENT (*)    All other components within normal limits  HEPATIC FUNCTION PANEL - Abnormal; Notable for the following components:   Albumin 3.4 (*)    All other components within normal limits  CBC  TROPONIN I  DIFFERENTIAL  DIFFERENTIAL   ____________________________________________  EKG  EKG read interpreted by me shows a flutter rate of 71 changes ____________________________________________  RADIOLOGY  ED MD interpretation:   Official radiology report(s): Dg Chest Portable 1 View  Result Date: 10/24/2018 CLINICAL DATA:  85 y/o  F; hypotension and possible dehydration. EXAM: PORTABLE CHEST 1 VIEW COMPARISON:  06/22/2018 chest radiograph and CT chest. FINDINGS: Mildly improved aeration of the left lung base and decreased small left effusion. Stable cardiomegaly given projection and technique. Aortic calcific atherosclerosis. No new consolidation, effusion, or pneumothorax. Left shoulder hemiarthroplasty noted. IMPRESSION: Mildly improved aeration of left lung base and decreased small left effusion. No new consolidation. Electronically Signed   By: Mitzi Hansen M.D.   On: 10/24/2018 18:05    ____________________________________________   PROCEDURES  Procedure(s) performed:   Procedures  Critical Care performed:   ____________________________________________   INITIAL IMPRESSION / ASSESSMENT AND  PLAN / ED COURSE  Discussed the patient with Dr. Dorris Carnes.  Dr. and recommended stopping the furosemide completely.  Family is very reluctant to do this because she was having a lot of trouble with leg swelling and leg ulcerations pleural effusions etc. we will cut the torsemide by half.  She will only take it once a day.  We will give her some nitrofurantoin for her UTI since she is  allergic to penicillin and amoxicillin and recommend skilled nursing for the patient.  She will follow-up with Dr. [place] Sink in a week.  Have not been able to reproduce the low blood pressures every time she has a low 1 within 2 or 3 minutes we retake it and it is back up to normal again.  Patient does not want to stay in the hospital family really does not want her to be in the hospital for blood pressures and blood work etc. normal.         ____________________________________________   FINAL CLINICAL IMPRESSION(S) / ED DIAGNOSES  Final diagnoses:  Weight loss     ED Discharge Orders    None       Note:  This document was prepared using Dragon voice recognition software and may include unintentional dictation errors.    Arnaldo Natal, MD 10/24/18 2110

## 2018-10-24 NOTE — Telephone Encounter (Signed)
Patient was sent to ED by Dr. Kirke CorinArida due to hypotension in the office and concern for dehydration due to ongoing diuresis and poor PO intake.  Initial ED BP was low but subsequent reading have been normal.  Creatinine is at baseline.  Patient reportedly wishes to go home.  I think it is reasonable for her to be discharged with torsemide on hold and to f/u within 1 week with Dr. Kirke CorinArida or an APP to reassess her BP and volume status.  Yvonne Kendallhristopher Kyen Taite, MD [facility] Pager: [phone]

## 2018-10-24 NOTE — ED Notes (Signed)
[facility] called this RN; updated on pt's condition.

## 2018-10-24 NOTE — ED Notes (Signed)
Remains Afib on monitor.

## 2018-10-24 NOTE — ED Notes (Signed)
Pt staying over night at daughter's house before returning to [facility].  House given report on pt's stay.

## 2018-10-24 NOTE — Discharge Instructions (Signed)
I discussed your case with Dr.

## 2018-10-24 NOTE — ED Notes (Signed)
Lab called to add on hepatic func & diff.

## 2018-10-24 NOTE — ED Notes (Signed)
Family states pt is a baseline for her dementia.

## 2018-10-24 NOTE — ED Triage Notes (Signed)
PT to ED from Dr. Jari SportsmanArida's office w/ family where she was having check up , pt sent over for hypotension and poss dehydration. PT 106/66 at this time. PT resides at [facility]. Hx of dementia, at baseline per family.

## 2018-10-24 NOTE — Progress Notes (Signed)
Cardiology Office Note   Date:  10/24/2018   ID:  Cheryl LoganRita Steele Kochanski, DOB 02/12/1934, MRN [id number]  PCP:  [facility]  Cardiologist:   Lorine BearsMuhammad Arida, MD   Chief Complaint  Patient presents with  . other    3 month f/u c/o edema ankles and discuss pt being off of tremor medication Meds reviewed verbally with pt.      History of Present Illness: Cheryl Winters is a 85 y.o. female who presents for a follow-up regarding persistent atrial fibrillation, chronic systolic heart failure, left pleural effusion and history of bradycardia.   She has chronic medical conditions that include hypertension, dementia, hyperlipidemia and frequent UTI. She had previous bradycardia in 2016 that improved after stopping propranolol.  Most recent echocardiogram in January 2019 showed an EF of 30 to 35%, moderate to severe mitral regurgitation, severely dilated left atrium, moderate tricuspid regurgitation and moderate pulmonary hypertension with peak systolic pressure of 55 mmHg. She had recurrent left-sided pleural effusion that required thoracentesis twice.   She had recurrent admissions for heart failure as well as leg edema and cellulitis.  She does have dementia with gradual decline in overall functional capacity.  [facility] is staying at [facility].  She has been taking torsemide 20 mg twice daily for congestive heart failure but has not had much fluid intake recently due to decline in her functioning and worsening dementia.  Her tremors also are worse.  According to our scale, her weight is down 24 pounds.  She denies chest pain.  She is a poor historian overall.   Past Medical History:  Diagnosis Date  . Abdominal pain, right upper quadrant   . Benign essential tremor   . Carpal tunnel syndrome   . Chronic combined systolic and diastolic CHF (congestive heart failure) (HCC)    a. TTE 10/2017: EF 30-35%, diffuse HK, not technically sufficient to allow for LV diastolic function,  moderate to severe MR, severely dilated left atrium measuring 54 mm, moderately dilated RV with mild systolic reduction, mildly dilated right atrium, moderate TR, PASP 55 mmHg, moderate left-sided pleural effusion  . Dementia (HCC)    a. mixed vascular and Alzheimer's  . DJD (degenerative joint disease) of knee   . GERD (gastroesophageal reflux disease)   . History of stress test    a. 10/2015 showed no evidence of ischemia, EF 62%, low risk study  . HTN (hypertension)   . Hyperlipidemia   . Persistent atrial fibrillation    a. diagnosed 10/2017; b. CHADS2VASc => 6 (CHF, HTN, age x 2, vascular disease, female); c. on Eliquis  . Syncope and collapse   . Systolic CHF (HCC)   . Ulnar nerve lesion   . UTI (urinary tract infection)   . Vitamin D deficiency     Past Surgical History:  Procedure Laterality Date  . Bilateral Bletharoplastices    . BREAST BIOPSY  1957 and 1962   x 2, Nml per pt  . BREAST CYST EXCISION     x 2 - nml path per pt  . CARPAL TUNNEL RELEASE  2004  . CATARACT EXTRACTION    . TOTAL KNEE AR[place]LAST[place]     [place]  . TOTAL SHOULDER REPLACEMENT  08/2010   left  . TUBAL LIGATION    . UMBILICAL HERNIA REPAIR    . VAGINAL DELIVERY     1 set Twins/2 single births     Current Outpatient Medications  Medication Sig Dispense Refill  .  alum & mag hydroxide-simeth (MAALOX/MYLANTA) 200-200-20 MG/5ML suspension Take 30 mLs by mouth every 6 (six) hours as needed for indigestion or heartburn. 355 mL 0  . apixaban (ELIQUIS) 5 MG TABS tablet Take 1 tablet (5 mg total) by mouth 2 (two) times daily. 180 tablet 3  . Cyanocobalamin (B-12) 1000 MCG TABS Take 1,000 mcg by mouth daily.    Marland Kitchen donepezil (ARICEPT) 5 MG tablet Take 5 mg by mouth at bedtime.    Marland Kitchen escitalopram (LEXAPRO) 5 MG tablet Take 5 mg by mouth daily.    . feeding supplement, ENSURE ENLIVE, (ENSURE ENLIVE) LIQD Take 237 mLs by mouth 2 (two) times daily between meals. 237 mL 12  .  levothyroxine (SYNTHROID, LEVOTHROID) 50 MCG tablet Take 50 mcg by mouth daily before breakfast.    . Melatonin 1 MG TABS Take 1 mg by mouth at bedtime.    . metoprolol tartrate (LOPRESSOR) 25 MG tablet Take 1 tablet (25 mg total) by mouth 2 (two) times daily. 180 tablet 1  . neomycin-bacitracin-polymyxin (NEOSPORIN) [phone] ointment Apply topically daily.    . potassium chloride (K-DUR,KLOR-CON) 10 MEQ tablet Take 1 tablet (10 mEq total) by mouth 2 (two) times daily. 180 tablet 3  . QUEtiapine (SEROQUEL) 25 MG tablet Take 25 mg by mouth at bedtime.    . risperiDONE (RISPERDAL) 0.5 MG tablet Take 1 tablet (0.5 mg total) by mouth 2 (two) times daily as needed (aggitation). (Patient taking differently: Take 0.5 mg by mouth daily as needed (aggitation). ) 60 tablet 0  . torsemide (DEMADEX) 20 MG tablet Take 1 tablet (20 mg total) by mouth 2 (two) times daily. (Patient not taking: Reported on 06/22/2018) 180 tablet 3   No current facility-administered medications for this visit.     Allergies:   Amlodipine; Amoxicillin-pot clavulanate; Penicillins; Codeine; and Codeine sulfate    Social History:  The patient  reports that she has never smoked. She has never used smokeless tobacco. She reports that she does not drink alcohol or use drugs.   Family History:  The patient's family history includes Alcohol abuse in an other family member; Arthritis in her mother; Brain cancer (age of onset: 68) in her father; Breast cancer in her maternal aunt; Dementia in her mother; Early death (age of onset: 29) in her father; Hypertension in her mother; Stroke in her mother.    ROS:  Please see the history of present illness.   Otherwise, review of systems are positive for none.   All other systems are reviewed and negative.    PHYSICAL EXAM: VS:  Pulse 81   Ht 5' (1.524 m)   Wt 155 lb 8 oz (70.5 kg)   BMI 30.37 kg/m  , BMI Body mass index is 30.37 kg/m. GEN: Well nourished, well developed, in no acute  distress  HEENT: normal  Neck: No JVD, carotid bruits, or masses Cardiac: Irregularly irregular ; no murmurs, rubs, or gallops, trace bilateral leg edema with significant stasis dermatitis and ulceration. Respiratory: Diminished breath sound at the left base with few crackles at the right base, normal work of breathing GI: soft, nontender, nondistended, + BS MS: no deformity or atrophy  Skin: warm and dry, no rash Neuro:  Strength and sensation are intact Psych: euthymic mood, full affect   EKG:  EKG is ordered today. EKG showed atrial fibrillation with heart rate of 81 bpm.  Low voltage with nonspecific T wave changes.  Recent Labs: 12/28/2017: Magnesium 1.9; TSH 22.490 06/22/2018: ALT 16; B  Natriuretic Peptide 638.0; BUN 19; Creatinine, Ser 0.96; Hemoglobin 12.5; Platelets 173; Potassium 3.5; Sodium 144    Lipid Panel    Component Value Date/Time   CHOL 245 (H) 07/15/2017 0817   TRIG 59.0 07/15/2017 0817   HDL 64.10 07/15/2017 0817   CHOLHDL 4 07/15/2017 0817   VLDL 11.8 07/15/2017 0817   LDLCALC 169 (H) 07/15/2017 0817   LDLDIRECT 123.9 08/27/2013 0814      Wt Readings from Last 3 Encounters:  10/24/18 155 lb 8 oz (70.5 kg)  06/22/18 179 lb 14.3 oz (81.6 kg)  05/25/18 178 lb (80.7 kg)        ASSESSMENT AND PLAN:  1.  Hypotension: The patient's blood pressure is noted to be low at 80/60.  Her weight is down 24 pounds since last visit and I suspect that she is likely volume depleted as she is on torsemide 20 mg twice daily and she has not been having much fluid intake. I am going to send the patient to the ED for stat labs and likely admission if her labs confirm volume depletion and renal failure.  2. Chronic atrial fibrillation: Ventricular rate is reasonably controlled on metoprolol.  She is on anticoagulation with Eliquis.  We will need to check CBC to make sure she is not anemic.  3.  Recurrent left pleural effusion:  Status post thoracentesis twice.  No recent  recurrence.  4.  Chronic systolic heart failure: The patient is not a candidate for any ischemic cardiac work-up given her age and dementia.  5.  Chronic venous insufficiency with ulceration: Continue to use knee-high support stockings during the day with leg elevation.  6.  Essential tremor: The family reports worsening of symptoms since she was switched from propranolol.  We can consider switching her from metoprolol to propranolol if needed.  7.  Worsening dementia and agitation:   Disposition:   We will send the patient to the ED for evaluation of hypertension.  Signed,  Lorine BearsMuhammad Arida, MD  10/24/2018 4:00 PM    [facility]

## 2018-10-25 NOTE — Telephone Encounter (Signed)
Call placed to the patient's daughter in [place]. She stated that the patient is feeling better. An appointment has been made for 1/14 with Eula Listen, PA at 8:30. The daughter has verbalized her understanding. She will call back if anything further is needed.

## 2018-10-27 NOTE — Progress Notes (Signed)
Cardiology Office Note Date:  10/31/2018  Patient ID:  Geanie LoganRita Steele Reznik, DOB [birth year], MRN [id number] PCP:  [facility]  Cardiologist:  Dr. Kirke CorinArida, MD    Chief Complaint: ED follow up  History of Present Illness: Geanie LoganRita Steele Molnar is a 85 y.o. female with history of chronic systolic CHF, persistent A. fib on Eliquis, recurrent left pleural effusion status post thoracentesis x2, beta-blocker induced bradycardia previously on propanolol, hypertension, hyperlipidemia, dementia, tremor with recently discontinued beta-blocker secondary to bradycardia, and frequent UTI who presents for ED follow-up.  Most recent echo from 10/2017 showed an EF of 30 to 35%, moderate to severe mitral regurgitation, severely dilated left atrium, moderate tricuspid regurgitation, and moderate pulmonary hypertension with a PASP of 55 mmHg.  She has had recurrent admissions for heart failure as well as leg edema and cellulitis.  She has not been felt to be an ischemic work-up candidate secondary to her age and dementia.  She was last seen in the office on 10/24/2018 for follow-up with noted decreased p.o. intake secondary to decline in her functional status and worsening dementia.  Her weight was down 24 pounds at 155 pounds.  Her blood pressure was noted to be low at 80/60.  In this setting, she was sent to the ED for stat labs and likely admission if her labs confirmed of volume depletion and renal failure.  Labs in the ED showed an initial troponin of 0.04 trending to less than 0.03, potassium 3.7, serum creatinine 1.01 with a baseline of 0.8-0.9, unremarkable CBC, albumin 3.4, AST/ALT normal.  Chest x-ray showed mildly improved aeration of the left lung base and decreased small left pleural effusion.  It appears the patient was having fluctuating blood pressures in the ED.  Her torsemide was decreased by half and she was treated for a UTI.  The patient's family has felt the decreased p.o. intake noted and the  patient has been in the setting of a worsening tremor since her beta-blocker was discontinued for bradycardia.  Patient is accompanied by her daughter today who provides history given the patient's underlying dementia.  Patient's daughter indicates the patient has done well though continues to appear weak.  No recent falls.  The patient has denied any chest pain, shortness of breath, dizziness, or syncope.  Since wearing compression stockings her lower extremity edema has significantly improved.  The patient's daughter feels like the decrease in weight has been in the setting of improvement in her lower extremity swelling.  She continues to struggle with food intake secondary to her tremor.  Of note, the patient's blood pressure in our office today it was 62/42.  Upon attempting to reproduce this, I was unable to obtain a manual blood pressure.  Patient's daughter called [facility] with a last reported blood pressure on 10/25/2018 of 123/68 which was recorded at 10 AM.    Past Medical History:  Diagnosis Date  . Abdominal pain, right upper quadrant   . Benign essential tremor   . Carpal tunnel syndrome   . Chronic combined systolic and diastolic CHF (congestive heart failure) (HCC)    a. TTE 10/2017: EF 30-35%, diffuse HK, not technically sufficient to allow for LV diastolic function, moderate to severe MR, severely dilated left atrium measuring 54 mm, moderately dilated RV with mild systolic reduction, mildly dilated right atrium, moderate TR, PASP 55 mmHg, moderate left-sided pleural effusion  . Dementia (HCC)    a. mixed vascular and Alzheimer's  . DJD (degenerative joint disease) of  knee   . GERD (gastroesophageal reflux disease)   . History of stress test    a. 10/2015 showed no evidence of ischemia, EF 62%, low risk study  . HTN (hypertension)   . Hyperlipidemia   . Persistent atrial fibrillation    a. diagnosed 10/2017; b. CHADS2VASc => 6 (CHF, HTN, age x 2, vascular disease, female); c. on  Eliquis  . Syncope and collapse   . Ulnar nerve lesion   . UTI (urinary tract infection)   . Vitamin D deficiency     Past Surgical History:  Procedure Laterality Date  . Bilateral Bletharoplastices    . BREAST BIOPSY  1957 and 1962   x 2, Nml per pt  . BREAST CYST EXCISION     x 2 - nml path per pt  . CARPAL TUNNEL RELEASE  2004  . CATARACT EXTRACTION    . [place]OTAL [place]     [place]  . TOTAL SHOULDER REPLACEMENT  08/2010   left  . TUBAL LIGATION    . UMBILICAL HERNIA REPAIR    . VAGINAL DELIVERY     1 set Twins/2 single births    Current Meds  Medication Sig  . alum & mag hydroxide-simeth (MAALOX/MYLANTA) 200-200-20 MG/5ML suspension Take 30 mLs by mouth every 6 (six) hours as needed for indigestion or heartburn.  Marland Kitchen. ammonium lactate (LAC-HYDRIN) 12 % lotion Apply 1 application topically 2 (two) times daily. (apply to the lower legs)  . apixaban (ELIQUIS) 5 MG TABS tablet Take 1 tablet (5 mg total) by mouth 2 (two) times daily.  . Cyanocobalamin (B-12) 1000 MCG TABS Take 1,000 mcg by mouth daily.  Marland Kitchen. donepezil (ARICEPT) 5 MG tablet Take 5 mg by mouth at bedtime.  Marland Kitchen. escitalopram (LEXAPRO) 5 MG tablet Take 5 mg by mouth daily.  . feeding supplement, ENSURE ENLIVE, (ENSURE ENLIVE) LIQD Take 237 mLs by mouth 2 (two) times daily between meals.  Marland Kitchen. levothyroxine (SYNTHROID, LEVOTHROID) 75 MCG tablet Take 75 mcg by mouth daily before breakfast.   . Melatonin 5 MG TABS Take 5 mg by mouth at bedtime.   . metoprolol tartrate (LOPRESSOR) 25 MG tablet Take 1 tablet (25 mg total) by mouth 2 (two) times daily.  Marland Kitchen. neomycin-bacitracin-polymyxin (NEOSPORIN) [phone] ointment Apply 1 application topically daily.   . potassium chloride (K-DUR,KLOR-CON) 10 MEQ tablet Take 1 tablet (10 mEq total) by mouth 2 (two) times daily.  . QUEtiapine (SEROQUEL) 25 MG tablet Take 25 mg by mouth at bedtime.  . risperiDONE (RISPERDAL) 0.5 MG tablet Take 1 tablet (0.5 mg total)  by mouth 2 (two) times daily as needed (aggitation). (Patient taking differently: Take 0.5 mg by mouth daily as needed (agitation). )  . risperiDONE (RISPERDAL) 0.5 MG tablet Take 0.25 mg by mouth 2 (two) times daily.  Marland Kitchen. torsemide (DEMADEX) 20 MG tablet Take 1 tablet (20 mg total) by mouth 2 (two) times daily.    Allergies:   Amlodipine; Amlodipine besylate; Amoxicillin; Amoxicillin-pot clavulanate; Penicillins; Codeine; and Codeine sulfate   Social History:  The patient  reports that she has never smoked. She has never used smokeless tobacco. She reports that she does not drink alcohol or use drugs.   Family History:  The patient's family history includes Alcohol abuse in an other family member; Arthritis in her mother; Brain cancer (age of onset: 1342) in her father; Breast cancer in her maternal aunt; Dementia in her mother; Early death (age of onset: 9642) in her father; Hypertension  in her mother; Stroke in her mother.  ROS:   Review of Systems  Unable to perform ROS: Dementia     PHYSICAL EXAM:  VS:  BP (!) 62/42 (BP Location: Left Arm, Patient Position: Sitting, Cuff Size: Normal)   Pulse 77   Ht 5\' 2"  (1.575 m)   Wt 154 lb 12 oz (70.2 kg)   BMI 28.30 kg/m  BMI: Body mass index is 28.3 kg/m.  Physical Exam  Constitutional: She is oriented to person, place, and time. She appears well-developed and well-nourished.  HENT:  Head: Normocephalic and atraumatic.  Eyes: Right eye exhibits no discharge. Left eye exhibits no discharge.  Neck: Normal range of motion. No JVD present.  Cardiovascular: Normal rate, S1 normal, S2 normal and normal heart sounds. An irregularly irregular rhythm present. Exam reveals no distant heart sounds, no friction rub, no midsystolic click and no opening snap.  No murmur heard. Pulses:      Posterior tibial pulses are 2+ on the right side and 2+ on the left side.  Tremor noted at baseline  Pulmonary/Chest: Effort normal. No respiratory distress. She has  decreased breath sounds in the right lower field and the left lower field. She has no wheezes. She has no rales. She exhibits no tenderness.  Abdominal: Soft. She exhibits no distension. There is no abdominal tenderness.  Musculoskeletal:        General: No edema.  Neurological: She is alert and oriented to person, place, and time.  Skin: Skin is warm and dry. No cyanosis. Nails show no clubbing.  Psychiatric: She has a normal mood and affect. Her speech is normal and behavior is normal. Judgment and thought content normal.     EKG:  Was ordered and interpreted by me today. Shows A. fib, 77 bpm, no acute ST-T changes  Recent Labs: 12/28/2017: Magnesium 1.9; TSH 22.490 06/22/2018: B Natriuretic Peptide 638.0 10/24/2018: ALT 14; BUN 21; Creatinine, Ser 1.01; Hemoglobin 12.1; Platelets 210; Potassium 3.7; Sodium 137  No results found for requested labs within last 8760 hours.   Estimated Creatinine Clearance: 39.4 mL/min (A) (by C-G formula based on SCr of 1.01 mg/dL (H)).   Wt Readings from Last 3 Encounters:  10/31/18 154 lb 12 oz (70.2 kg)  10/24/18 155 lb 8 oz (70.5 kg)  06/22/18 179 lb 14.3 oz (81.6 kg)     Other studies reviewed: Additional studies/records reviewed today include: summarized above  ASSESSMENT AND PLAN:  1. Hypotension: This issue was noted for the patient 1 week prior and sent to the ED for further evaluation.  In the ED, it appears that she was having fluctuations of blood pressure readings though details of this are unclear.  She was advised to decrease her torsemide by half and treated for a UTI.  She continues to have significant fluctuations and dangerously low blood pressure readings with a BP of 62/42 in the office today.  I have discussed the case with Dr. Elpidio Anis who agrees the patient needs hospital admission and further evaluation however we are unable to proceed with direct admission secondary to patient since this.  In this setting, we have recommended the  patient be transferred to the ED for further evaluation and likely admission.  Dr. Elpidio Anis is in agreement with this plan.  I have notified Dr. Okey Dupre with our group as well.  Patient's daughter has been updated and is in agreement with the plan.  2. Permanent atrial fibrillation: Ventricular rate is well controlled on Lopressor 25 mg  twice daily.  Would recommend holding metoprolol until her hypotension is further evaluated.  Remains on Eliquis.  She will need CBC to ensure anemia is not playing a role given her anticoagulation.  3. Chronic systolic CHF: Weight is down approximately 20 pounds.  She is not a candidate for ischemic evaluation given her dementia, advanced age, and comorbid conditions.  Recommend holding metoprolol as above given her profound hypotension.  Blood pressure precludes escalation of evidence-based heart failure therapy at this time.  4. Recurrent left pleural effusion: Improved on most recent chest x-ray as above.  5. Essential tremor: At baseline.  6. Weight loss: Patient's daughter feels like this is in the setting of improved lower extremity swelling.  Cannot exclude poor p.o. intake however most recent albumin was essentially normal at 3.4.  Disposition: F/u with Dr. Kirke Corin or an APP in following ED evaluation and likely hospital admission.  Current medicines are reviewed at length with the patient today.  The patient did not have any concerns regarding medicines.  Signed, Eula Listen, PA-C 10/31/2018 9:15 AM     [facility] [address] [place], [place] [address] [phone]

## 2018-10-29 ENCOUNTER — Encounter: Payer: Self-pay | Admitting: Physician Assistant

## 2018-10-31 ENCOUNTER — Emergency Department
Admission: EM | Admit: 2018-10-31 | Discharge: 2018-10-31 | Disposition: A | Discharge status: Home / Self Care | Payer: Medicare Other | Attending: Emergency Medicine | Admitting: Emergency Medicine

## 2018-10-31 ENCOUNTER — Encounter: Payer: Self-pay | Admitting: Physician Assistant

## 2018-10-31 ENCOUNTER — Ambulatory Visit (INDEPENDENT_AMBULATORY_CARE_PROVIDER_SITE_OTHER): Payer: Medicare Other | Admitting: Physician Assistant

## 2018-10-31 ENCOUNTER — Encounter: Payer: Self-pay | Admitting: Medical Oncology

## 2018-10-31 VITALS — BP 62/42 | HR 77 | Ht 62.0 in | Wt 154.8 lb

## 2018-10-31 DIAGNOSIS — E861 Hypovolemia: Secondary | ICD-10-CM

## 2018-10-31 DIAGNOSIS — R634 Abnormal weight loss: Secondary | ICD-10-CM

## 2018-10-31 DIAGNOSIS — I5022 Chronic systolic (congestive) heart failure: Secondary | ICD-10-CM | POA: Diagnosis not present

## 2018-10-31 DIAGNOSIS — G309 Alzheimer's disease, unspecified: Secondary | ICD-10-CM | POA: Diagnosis not present

## 2018-10-31 DIAGNOSIS — F0280 Dementia in other diseases classified elsewhere without behavioral disturbance: Secondary | ICD-10-CM | POA: Diagnosis not present

## 2018-10-31 DIAGNOSIS — I482 Chronic atrial fibrillation, unspecified: Secondary | ICD-10-CM

## 2018-10-31 DIAGNOSIS — J90 Pleural effusion, not elsewhere classified: Secondary | ICD-10-CM

## 2018-10-31 DIAGNOSIS — I5042 Chronic combined systolic (congestive) and diastolic (congestive) heart failure: Secondary | ICD-10-CM | POA: Diagnosis not present

## 2018-10-31 DIAGNOSIS — I110 Hypertensive heart disease with heart failure: Secondary | ICD-10-CM | POA: Diagnosis not present

## 2018-10-31 DIAGNOSIS — I9589 Other hypotension: Secondary | ICD-10-CM | POA: Diagnosis not present

## 2018-10-31 DIAGNOSIS — F0390 Unspecified dementia without behavioral disturbance: Secondary | ICD-10-CM

## 2018-10-31 DIAGNOSIS — I950 Idiopathic hypotension: Secondary | ICD-10-CM

## 2018-10-31 LAB — BASIC METABOLIC PANEL
Anion gap: 9 (ref 5–15)
BUN: 19 mg/dL (ref 8–23)
CO2: 28 mmol/L (ref 22–32)
Calcium: 9 mg/dL (ref 8.9–10.3)
Chloride: 101 mmol/L (ref 98–111)
Creatinine, Ser: 1 mg/dL (ref 0.44–1.00)
GFR calc Af Amer: >60 mL/min (ref >60)
GFR, EST NON AFRICAN AMERICAN: 52 mL/min — AB (ref >60)
Glucose, Bld: 111 mg/dL — ABNORMAL HIGH (ref 70–99)
Potassium: 4.6 mmol/L (ref 3.5–5.1)
Sodium: 138 mmol/L (ref 135–145)

## 2018-10-31 LAB — URINALYSIS, COMPLETE (UACMP) WITH MICROSCOPIC
Bacteria, UA: NONE SEEN
Bilirubin Urine: NEGATIVE
Glucose, UA: NEGATIVE mg/dL
Hgb urine dipstick: NEGATIVE
Ketones, ur: NEGATIVE mg/dL
Leukocytes, UA: NEGATIVE
Nitrite: NEGATIVE
Protein, ur: 30 mg/dL — AB
Specific Gravity, Urine: 1.015 (ref 1.005–1.030)
pH: 6.5 (ref 5.0–8.0)

## 2018-10-31 LAB — CBC
HCT: 38.3 % (ref 36.0–46.0)
Hemoglobin: 12.6 g/dL (ref 12.0–15.0)
MCH: 31.1 pg (ref 26.0–34.0)
MCHC: 32.9 g/dL (ref 30.0–36.0)
MCV: 94.6 fL (ref 80.0–100.0)
Platelets: 195 10*3/uL (ref 150–400)
RBC: 4.05 MIL/uL (ref 3.87–5.11)
RDW: 13.3 % (ref 11.5–15.5)
WBC: 5.9 10*3/uL (ref 4.0–10.5)
nRBC: 0 % (ref 0.0–0.2)

## 2018-10-31 LAB — TROPONIN I: Troponin I: <0.03 ng/mL (ref <0.03)

## 2018-10-31 MED ORDER — METOPROLOL TARTRATE 25 MG PO TABS: 12.5000 mg | ORAL_TABLET | Freq: Two times a day (BID) | ORAL | 1 refills | Status: DC

## 2018-10-31 MED ORDER — ACETAMINOPHEN 500 MG PO TABS
1000.0000 mg | ORAL_TABLET | Freq: Once | ORAL | Status: AC
  Administered 2018-10-31: 1000 mg via ORAL
  Filled 2018-10-31: qty 2

## 2018-10-31 MED ORDER — ALUM & MAG HYDROXIDE-SIMETH 200-200-20 MG/5ML PO SUSP
30.0000 mL | Freq: Once | ORAL | Status: AC
  Administered 2018-10-31: 30 mL via ORAL
  Filled 2018-10-31 (×2): qty 30

## 2018-10-31 NOTE — ED Provider Notes (Addendum)
[facility] Emergency Department Provider Note       Time seen: ----------------------------------------- 10:00 AM on 10/31/2018 -----------------------------------------  Level V caveat: History/ROS limited by dementia I have reviewed the triage vital signs and the nursing notes.  HISTORY   Chief Complaint Hypotension    HPI Cheryl Winters is a 85 y.o. female with a history of CHF, dementia, GERD, hypertension, hyperlipidemia, atrial fibrillation, syncope who presents to the ED for low blood pressure.  She is recently been seen for same, she was having a checkup today and was noted to have blood pressure 62/42 and was sent to the ER for evaluation.  Reportedly they have decreased her torsemide.  She has not had any recent sickness or other complaints.  Past Medical History:  Diagnosis Date  . Abdominal pain, right upper quadrant   . Benign essential tremor   . Carpal tunnel syndrome   . Chronic combined systolic and diastolic CHF (congestive heart failure) (HCC)    a. TTE 10/2017: EF 30-35%, diffuse HK, not technically sufficient to allow for LV diastolic function, moderate to severe MR, severely dilated left atrium measuring 54 mm, moderately dilated RV with mild systolic reduction, mildly dilated right atrium, moderate TR, PASP 55 mmHg, moderate left-sided pleural effusion  . Dementia (HCC)    a. mixed vascular and Alzheimer's  . DJD (degenerative joint disease) of knee   . GERD (gastroesophageal reflux disease)   . History of stress test    a. 10/2015 showed no evidence of ischemia, EF 62%, low risk study  . HTN (hypertension)   . Hyperlipidemia   . Persistent atrial fibrillation    a. diagnosed 10/2017; b. CHADS2VASc => 6 (CHF, HTN, age x 2, vascular disease, female); c. on Eliquis  . Syncope and collapse   . Ulnar nerve lesion   . UTI (urinary tract infection)   . Vitamin D deficiency     Patient Active Problem List   Diagnosis Date Noted  .  Acute on chronic systolic CHF (congestive heart failure), NYHA class 3 (HCC) 12/28/2017  . Somnolence 12/28/2017  . Orthostatic hypotension 11/11/2017  . Dilated cardiomyopathy (HCC) 11/03/2017  . Dementia (HCC) 11/03/2017  . Pleural effusion 11/02/2017  . A-fib (HCC) 11/01/2017  . Atrial fibrillation with RVR (HCC) 11/01/2017  . Memory change 06/27/2016  . Acute cystitis without hematuria 11/03/2015  . Lightheadedness 10/31/2015  . Bradycardia 09/19/2014  . Health care maintenance 09/02/2014  . Medicare annual wellness visit, subsequent 08/21/2012  . Tremor, essential 08/21/2012  . Hypertension 11/22/2011  . Hyperlipidemia 11/22/2011    Past Surgical History:  Procedure Laterality Date  . Bilateral Bletharoplastices    . BREAST BIOPSY  1957 and 1962   x 2, Nml per pt  . BREAST CYST EXCISION     x 2 - nml path per pt  . CARPAL TUNNEL RELEASE  2004  . CATARACT EXTRACTION    . TOTAL KNEE ARTHROPLASTY     bilateral, total, [place] [place]  . TOTAL SHOULDER REPLACEMENT  08/2010   left  . TUBAL LIGATION    . UMBILICAL HERNIA REPAIR    . VAGINAL DELIVERY     1 set Twins/2 single births    Allergies Amlodipine; Amlodipine besylate; Amoxicillin; Amoxicillin-pot clavulanate; Penicillins; Codeine; and Codeine sulfate  Social History Social History   Tobacco Use  . Smoking status: Never Smoker  . Smokeless tobacco: Never Used  Substance Use Topics  . Alcohol use: No  . Drug use: No  Review of Systems Unknown, history of dementia  All systems negative/normal/unremarkable except as stated in the HPI  ____________________________________________   PHYSICAL EXAM:  VITAL SIGNS: ED Triage Vitals  Enc Vitals Group     BP 10/31/18 0939 (!) 88/53     Pulse Rate 10/31/18 0939 73     Resp 10/31/18 0939 18     Temp 10/31/18 0939 (!) 97.5 F (36.4 C)     Temp Source 10/31/18 0939 Oral     SpO2 10/31/18 0939 98 %     Weight 10/31/18 0940 154 lb 5.2 oz (70 kg)     Height  10/31/18 0940 5\' 2"  (1.575 m)     Head Circumference --      Peak Flow --      Pain Score 10/31/18 0940 0     Pain Loc --      Pain Edu? --      Excl. in GC? --     Constitutional: Alert disoriented.  Well appearing and in no distress. Eyes: Conjunctivae are normal. Normal extraocular movements. ENT      Head: Normocephalic and atraumatic.      Nose: No congestion/rhinnorhea.      Mouth/Throat: Mucous membranes are moist.      Neck: No stridor. Cardiovascular: Normal rate, irregular rhythm. No murmurs, rubs, or gallops. Respiratory: Normal respiratory effort without tachypnea nor retractions. Breath sounds are clear and equal bilaterally. No wheezes/rales/rhonchi. Gastrointestinal: Soft and nontender. Normal bowel sounds Musculoskeletal: Nontender with normal range of motion in extremities. No lower extremity tenderness nor edema. Neurologic:  Normal speech and language. No gross focal neurologic deficits are appreciated.  Skin:  Skin is warm, dry and intact. No rash noted. Psychiatric: Mood and affect are normal. Speech and behavior are normal.  ____________________________________________  EKG: Interpreted by me.  Atrial fibrillation with a rate of 64 bpm, low voltage QRS, normal QT, normal axis  ____________________________________________  ED COURSE:  As part of my medical decision making, I reviewed the following data within the electronic MEDICAL RECORD NUMBER History obtained from family if available, nursing notes, old chart and ekg, as well as notes from prior ED visits. Patient presented for hypotension, we will assess with labs and imaging as indicated at this time. Clinical Course as of Oct 31 1410  Tue Oct 31, 2018  1300 BP 105/75.  Patient has been normotensive throughout her stay here.   [JW]    Clinical Course User Index [JW] Emily FilbertWilliams, Jonathan E, MD   Procedures ____________________________________________   LABS (pertinent positives/negatives)  Labs Reviewed   BASIC METABOLIC PANEL - Abnormal; Notable for the following components:      Result Value   Glucose, Bld 111 (*)    GFR calc non Af Amer 52 (*)    All other components within normal limits  URINALYSIS, COMPLETE (UACMP) WITH MICROSCOPIC - Abnormal; Notable for the following components:   Protein, ur 30 (*)    All other components within normal limits  CBC  TROPONIN I   ____________________________________________   DIFFERENTIAL DIAGNOSIS   Dehydration, electrolyte abnormality, medication side effect, occult infection  FINAL ASSESSMENT AND PLAN  Hypotension   Plan: The patient had presented for hypotension. Patient's labs were unremarkable.  He was not hypotensive below 95 systolic throughout her stay here.  This is likely her normal blood pressure.  I do not see any acute emergency medical condition at this time.  We will decrease her metoprolol dose by half.  She is cleared  for outpatient follow-up.   Ulice Dash, MD    Note: This note was generated in part or whole with voice recognition software. Voice recognition is usually quite accurate but there are transcription errors that can and very often do occur. I apologize for any typographical errors that were not detected and corrected.     Emily Filbert, MD 10/31/18 1405    Emily Filbert, MD 10/31/18 [phone]

## 2018-10-31 NOTE — Patient Instructions (Signed)
Medication Instructions:  Your physician recommends that you continue on your current medications as directed. Please refer to the Current Medication list given to you today.  If you need a refill on your cardiac medications before your next appointment, please call your pharmacy.   Lab work: None ordered  If you have labs (blood work) drawn today and your tests are completely normal, you will receive your results only by: Marland Kitchen MyChart Message (if you have MyChart) OR . A paper copy in the mail If you have any lab test that is abnormal or we need to change your treatment, we will call you to review the results.  Testing/Procedures: None ordered   Follow-Up: At [facility], you and your health needs are our priority.  As part of our continuing mission to provide you with exceptional heart care, we have created designated Provider Care Teams.  These Care Teams include your primary Cardiologist (physician) and Advanced Practice Providers (APPs -  Physician Assistants and Nurse Practitioners) who all work together to provide you with the care you need, when you need it. You will need a follow up appointment as advised by hospital MD. Bonita Quin may see Dr. Kirke Corin or one of the following Advanced Practice Providers on your designated Care Team:   Nicolasa Ducking, NP Eula Listen, PA-C . Marisue Ivan, PA-C  Any Other Special Instructions Will Be Listed Below (If Applicable). Please report to ED for urgent medical care.

## 2018-10-31 NOTE — ED Triage Notes (Signed)
Pt was having checkup today and was noted to have BP of 62/42 in PCP office- sent to ED for eval. This exact thing happened last week per daughter.

## 2018-11-02 ENCOUNTER — Ambulatory Visit: Payer: Medicare Other

## 2018-12-21 IMAGING — CR DG CHEST 2V
2 series · 2 of 2 positions shown · non-contrast
Comparison: 11/02/2017

CLINICAL DATA: Weakness and dizziness

EXAM:
CHEST  2 VIEW

[chest lat]
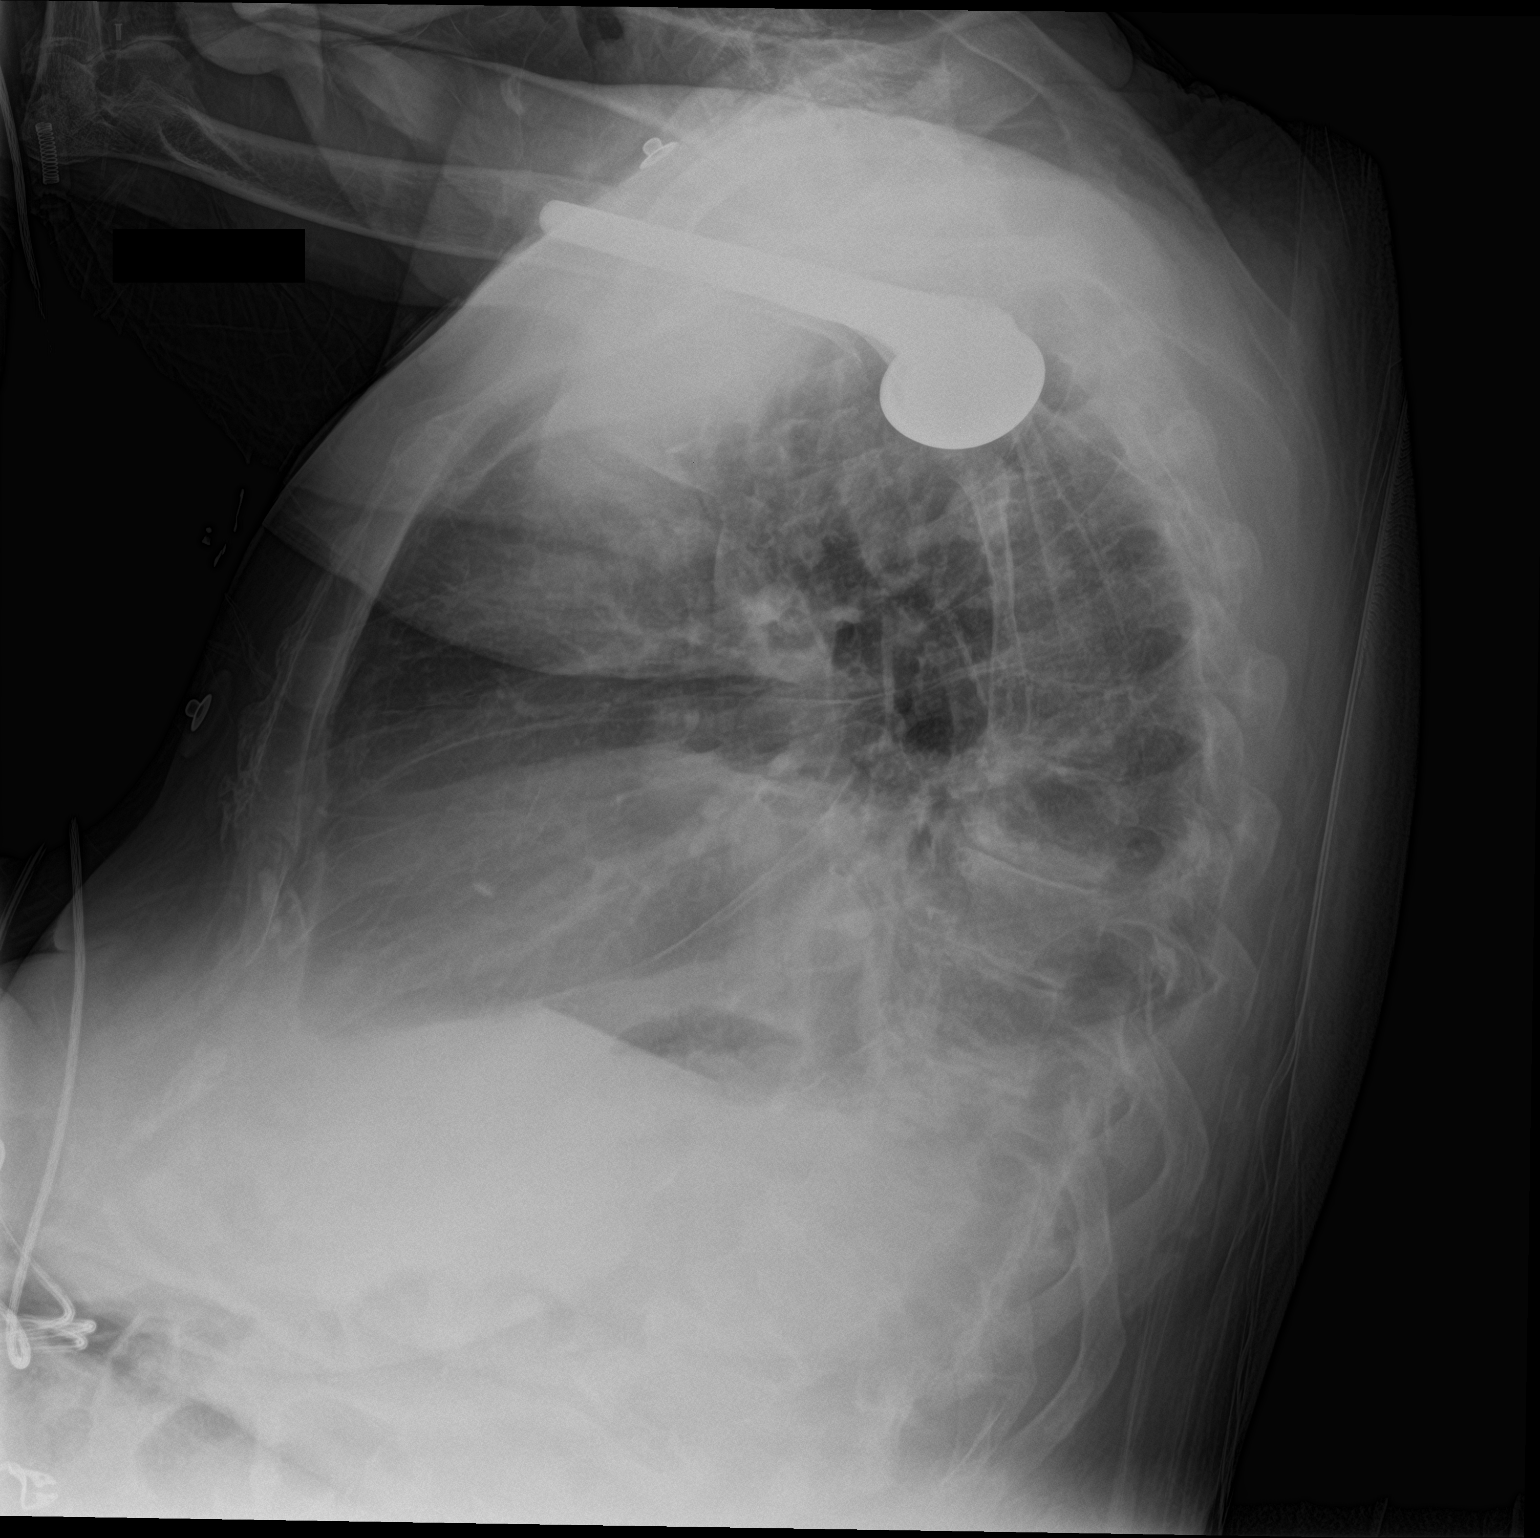

[chest ap]
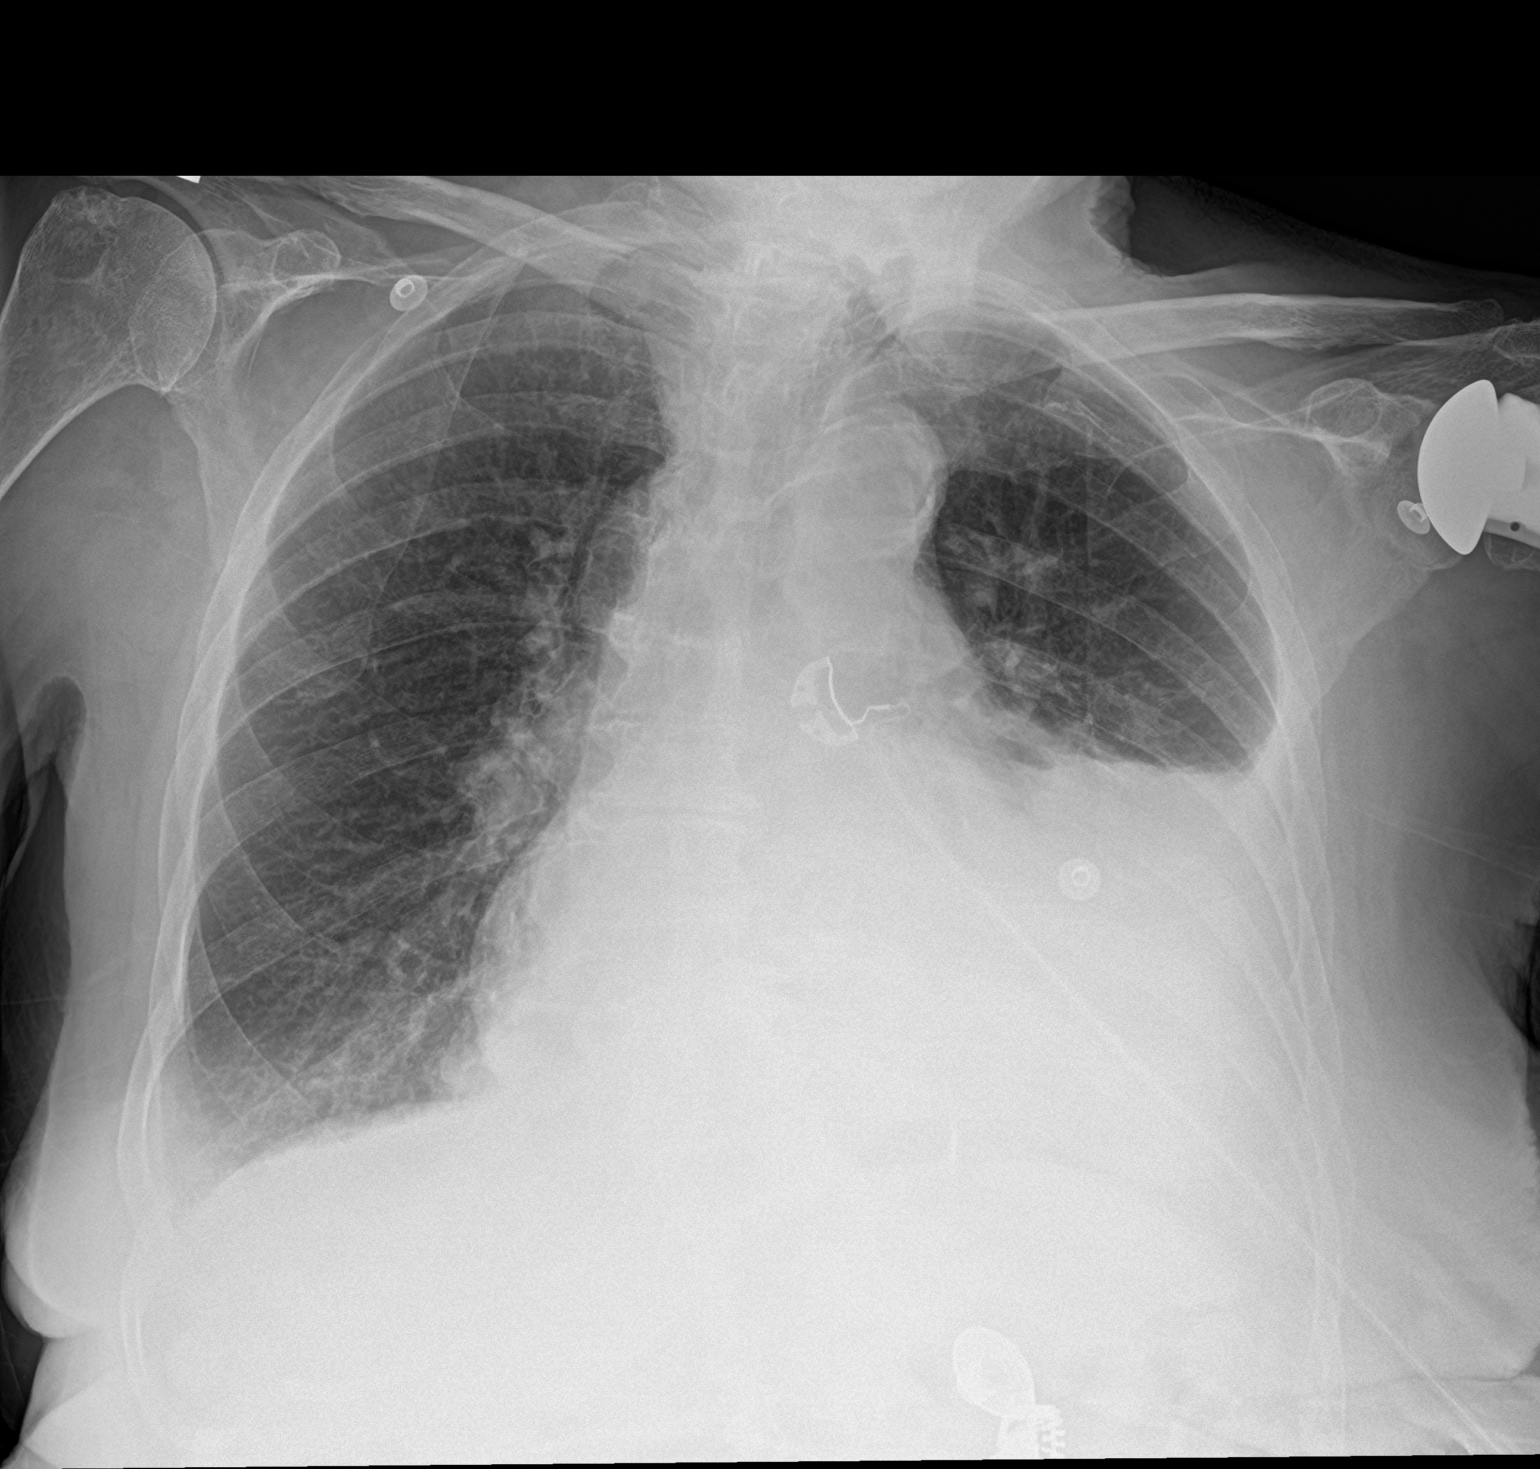

[2 of 2 positions shown; findings below may reference images not displayed]

FINDINGS: Aortic atherosclerosis. Cardiac enlargement is again noted. There is
a large left pleural effusion which appears slightly increased in
volume from previous exam. Small left effusion is suspected.
IMPRESSION: 1. Large left pleural effusion is slightly increased in volume from
previous exam.
2. Cardiac enlargement and Aortic Atherosclerosis ([id number]).

## 2018-12-27 IMAGING — CR DG CHEST 2V
1 series · 2 of 2 positions shown · non-contrast
Comparison: 11/08/2017

CLINICAL DATA: Pleural effusions.  Shortness of breath.

EXAM:
CHEST  2 VIEW

[Series 1: dg chest 2 view · 0.14mm/px · 2 of 2 slices shown]
[im 1/2]
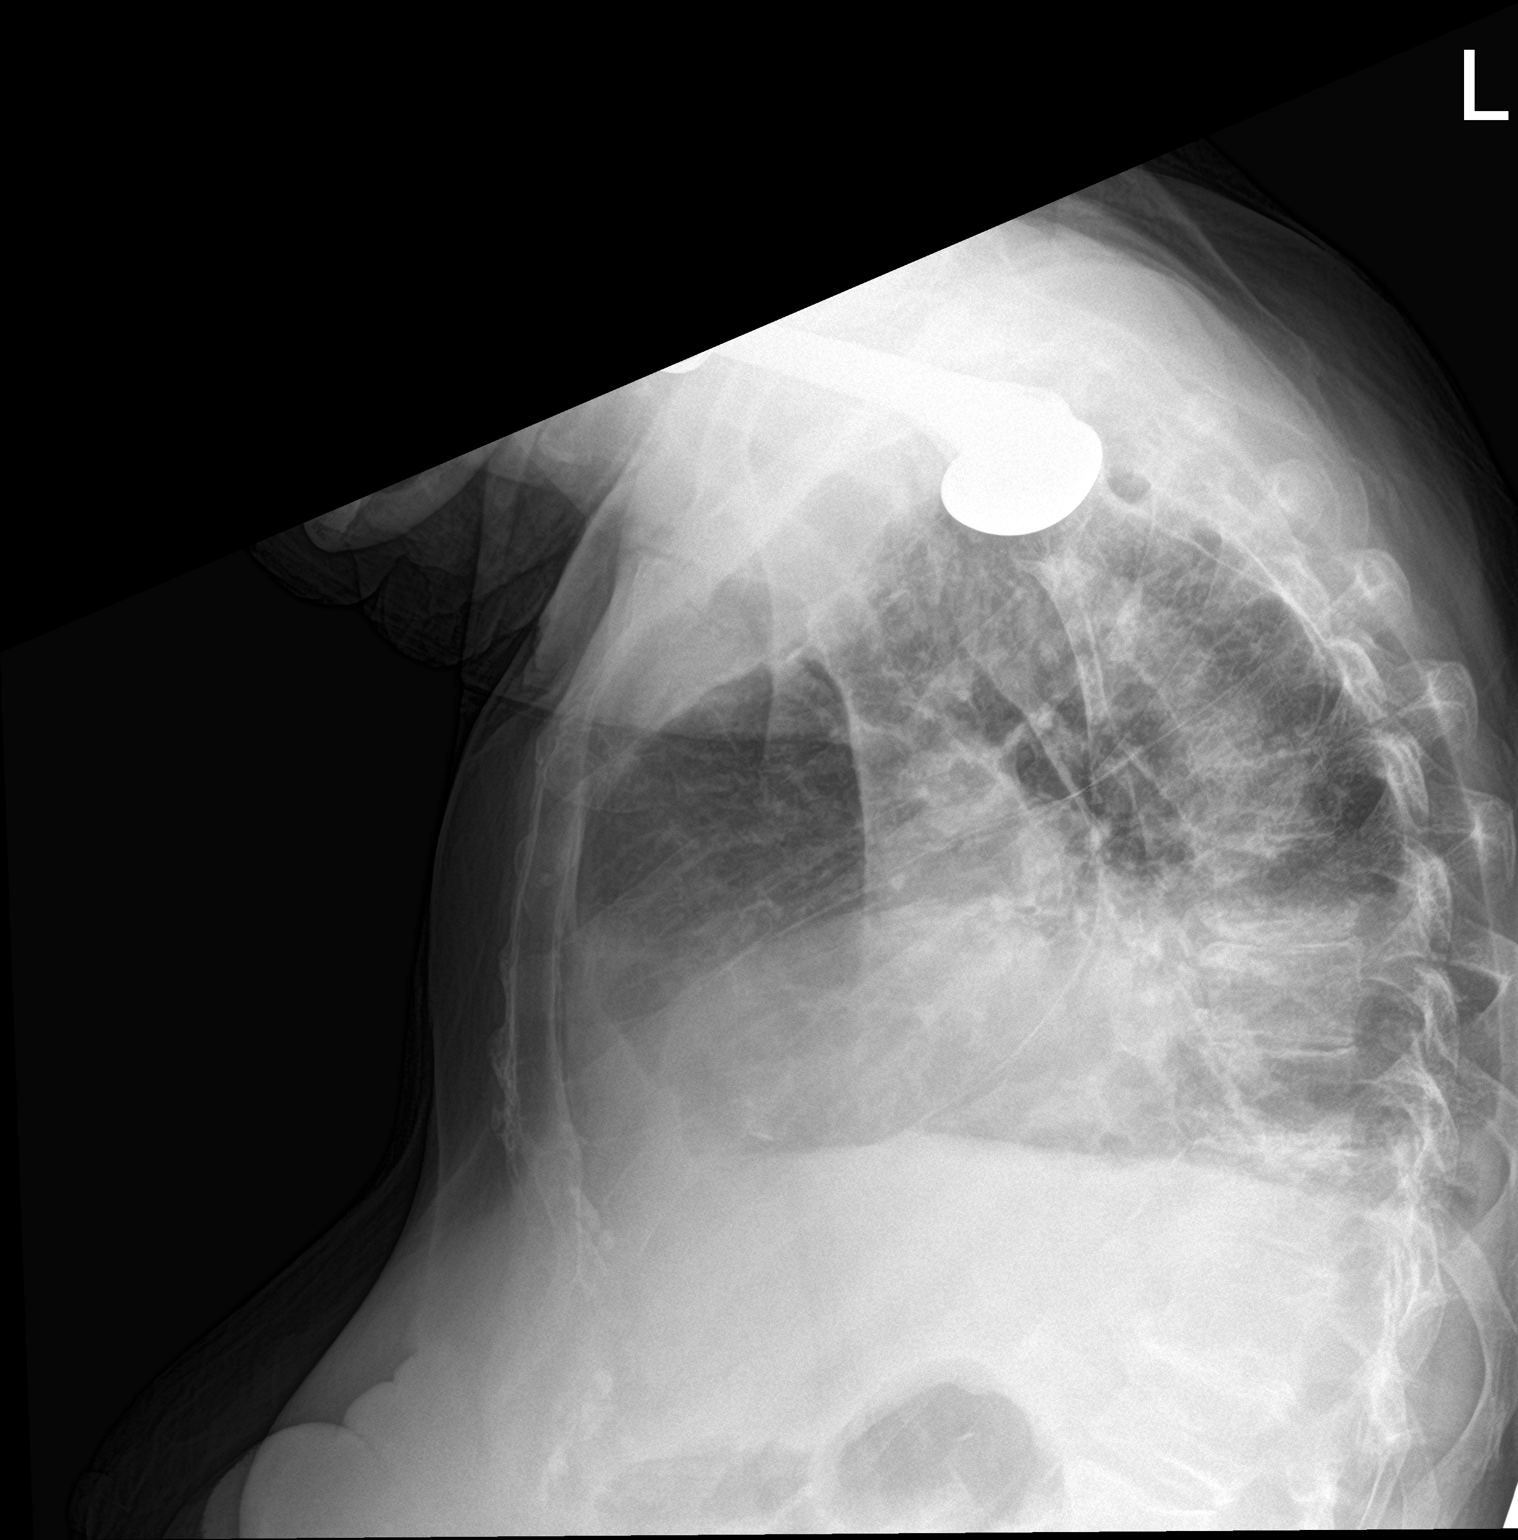
[im 2/2]
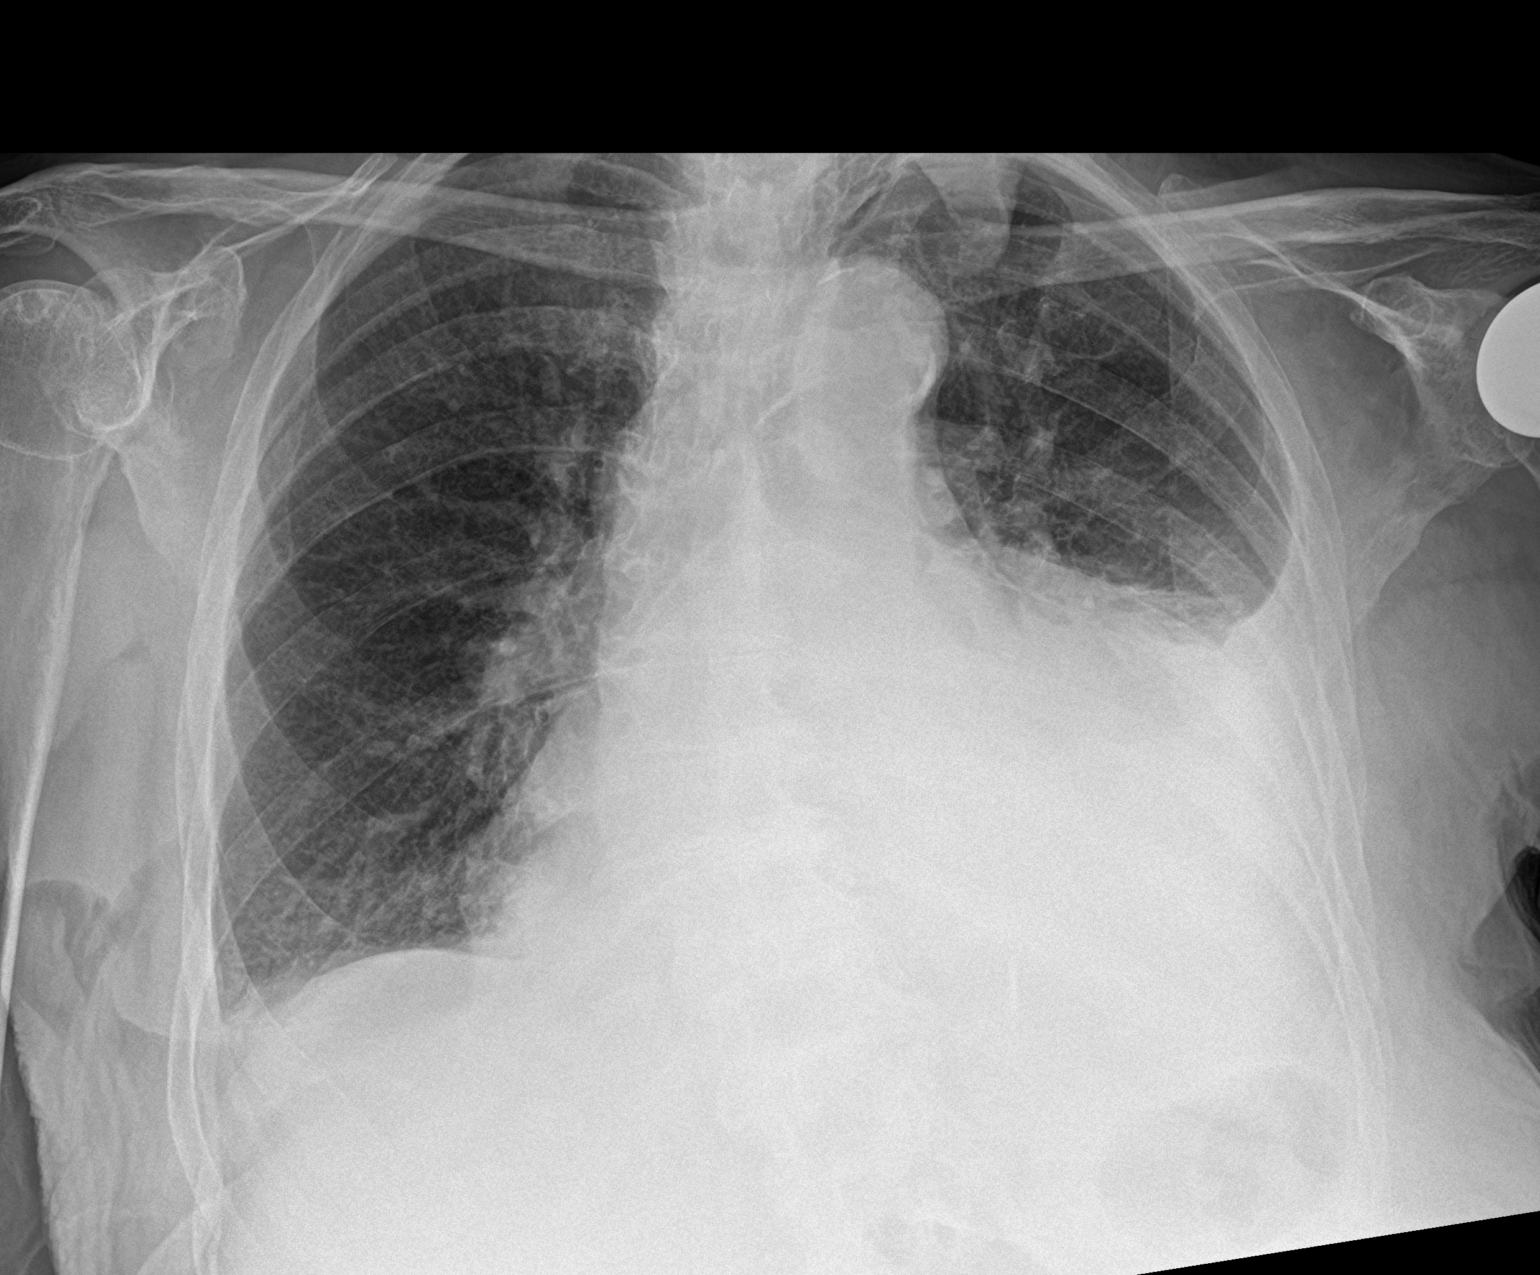

[2 of 2 positions shown; findings below may reference images not displayed]

FINDINGS: Heart is enlarged. Aortic atherosclerosis is again seen. A large
left pleural effusion is present. Retrocardiac airspace disease is
noted. Minimal right basilar airspace disease is stable, likely
atelectasis.
IMPRESSION: 1. Cardiomegaly without failure.
2. Retrocardiac airspace disease and pleural effusion is similar the
prior study, concerning for pneumonia.
3. Minimal right basilar airspace disease is stable, likely
atelectasis.
4.  Aortic Atherosclerosis ([id number]).

## 2019-01-26 ENCOUNTER — Telehealth: Payer: Self-pay | Admitting: Cardiovascular Disease

## 2019-01-26 NOTE — Telephone Encounter (Signed)
Please call to discus PA regarding Eliquis.

## 2019-01-26 NOTE — Telephone Encounter (Signed)
Lmovm concerning PA.

## 2019-01-26 NOTE — Telephone Encounter (Signed)
Daughter returned call. She is POA of pt and mentioned that she is having issues with pt's home care facility and pharmacy that they are using. Pt has been charged and received a bill for her medication Eliquis. She isn't sure if pt is needing a PA at this time. She mentioned that pharmacy said that her blood thinner says that Dr at facility was the prescriber. I told her to reach out to the facility to make sure that they haven't made any changes to her mother's medication. She is aware that Dr. Kirke Corin prescribed Eliquis 05/2018 and that she should have refills until 02/2019. She will contact office to let us know if a PA is required. I haven't initiated a PA at this time. We would like to make sure that PA is needed and that there hasn't been any changes made at this time.

## 2019-01-26 NOTE — Telephone Encounter (Signed)
Spoke w/pt's daughter concerning PA she is unaware of PA being needed. She will contact her sister to make sure she needs one bc she isn't the one that called.

## 2019-06-06 IMAGING — CT CT CERVICAL SPINE W/O CM
2 series · 12 of 27 positions shown, 15 images · non-contrast
Comparison: No prior cervical CT

CLINICAL DATA: 84-year-old female with a history of fall and neck
pain

EXAM:
CT CERVICAL SPINE WITHOUT CONTRAST
TECHNIQUE: Multidetector CT imaging of the cervical spine was performed without
intravenous contrast. Multiplanar CT image reconstructions were also
generated.

[Series 3: c spine soft · axial · 0.35mm/px · z∈[-198,-88]mm · 7 of 67 slices shown, 9 images]
[im 6/67  soft-tissue]
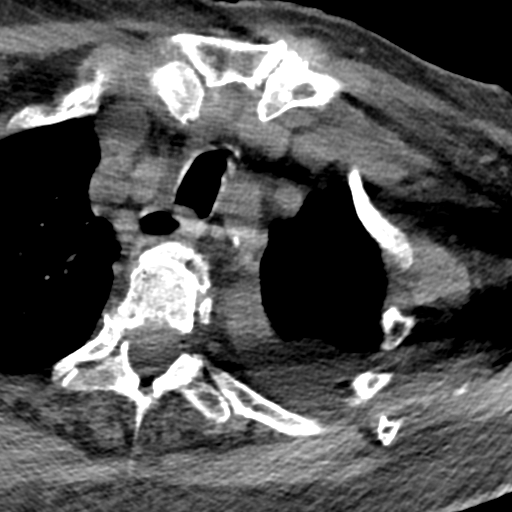
[im 6/67  bone]
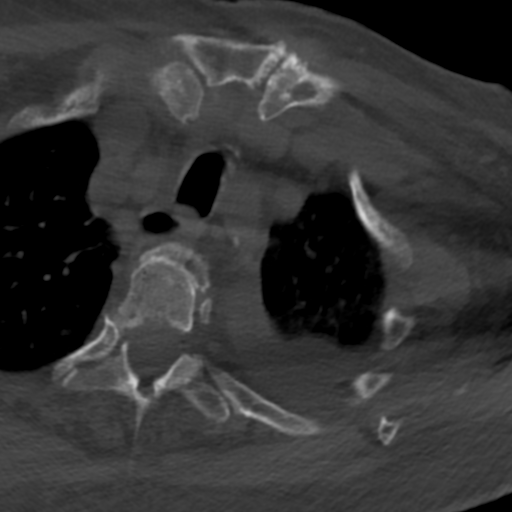
[im 16/67  bone]
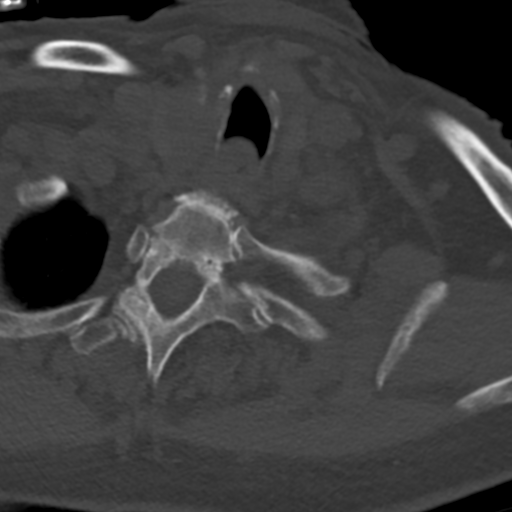
[im 26/67  bone]
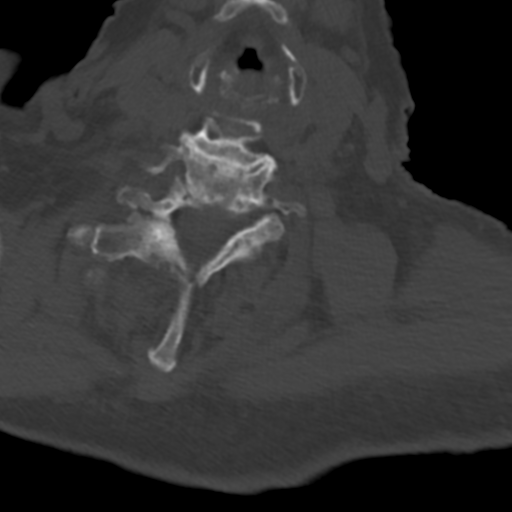
[im 36/67  bone]
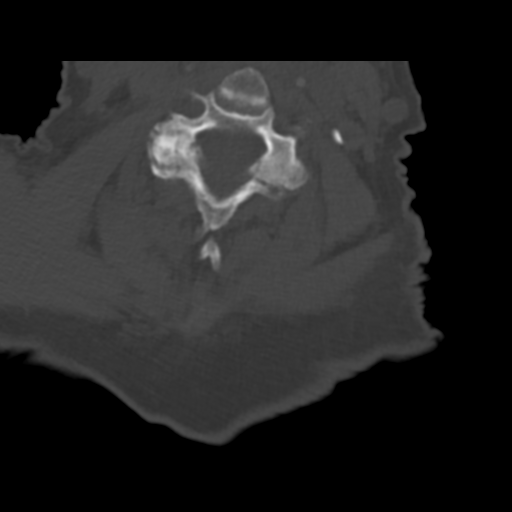
[im 41/67  soft-tissue]
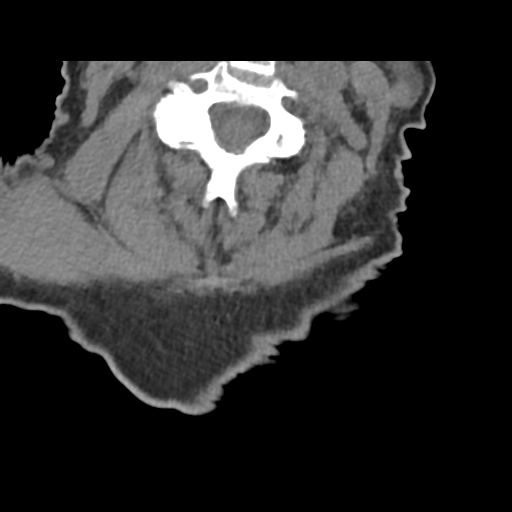
[im 41/67  bone]
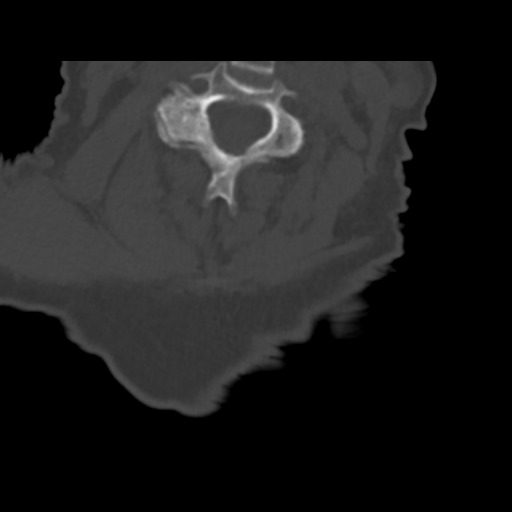
[im 51/67  bone]
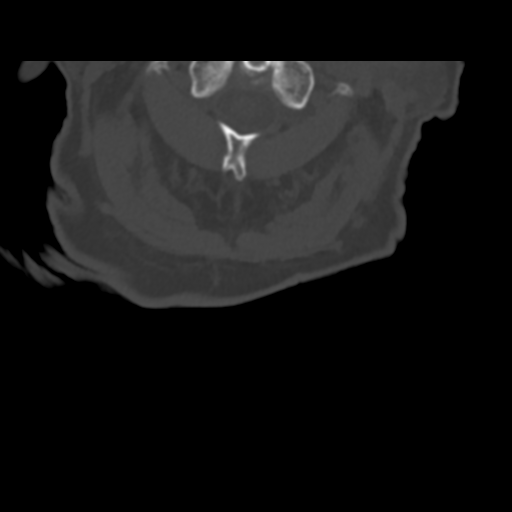
[im 61/67  bone]
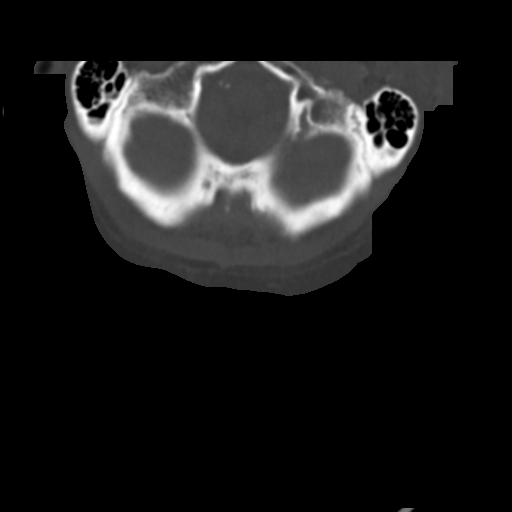

[Series 6: sagittal bone · sagittal · 0.25mm/px · 5 of 59 slices shown, 6 images]
[im 20/59  bone]
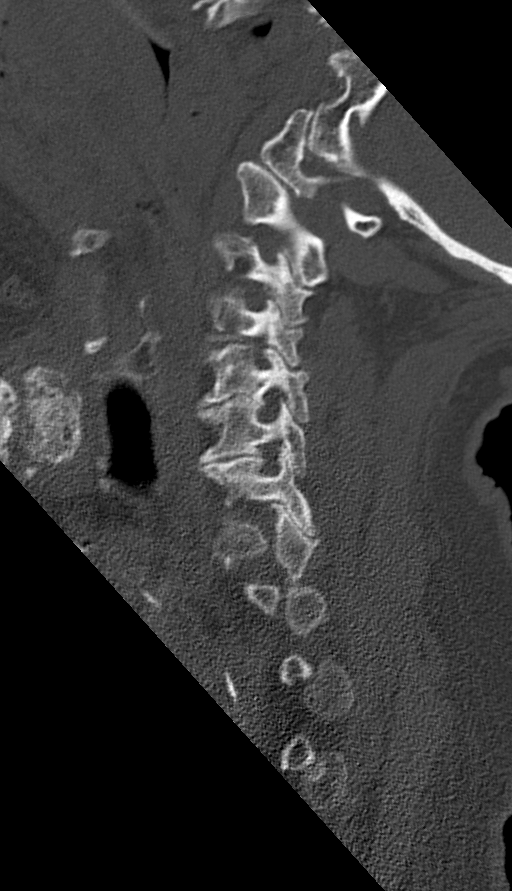
[im 25/59  bone]
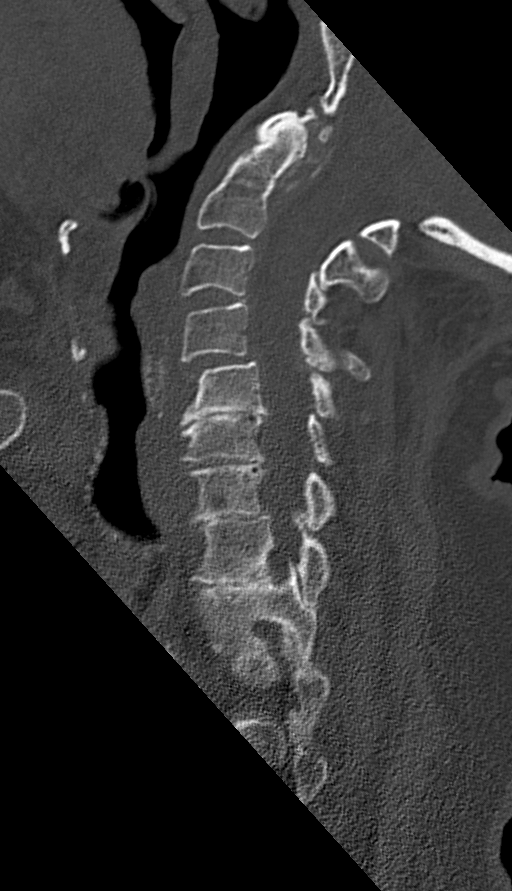
[im 30/59  soft-tissue]
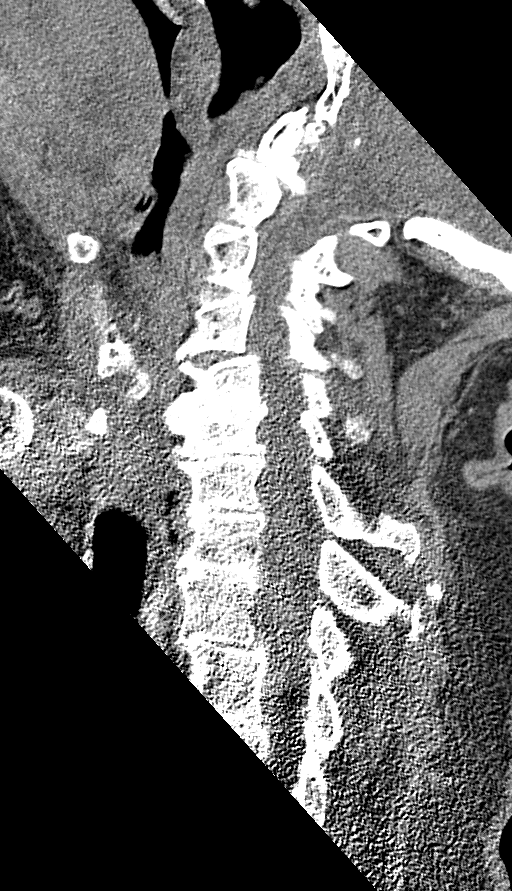
[im 30/59  bone]
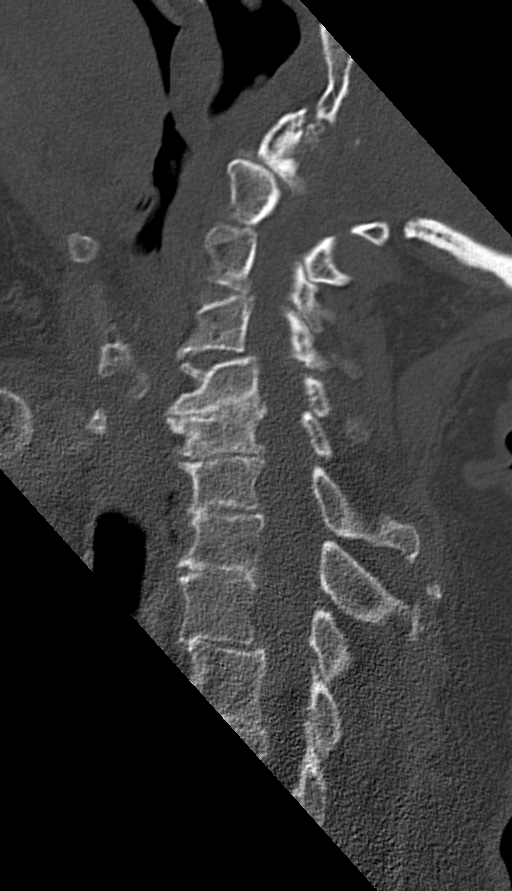
[im 34/59  bone]
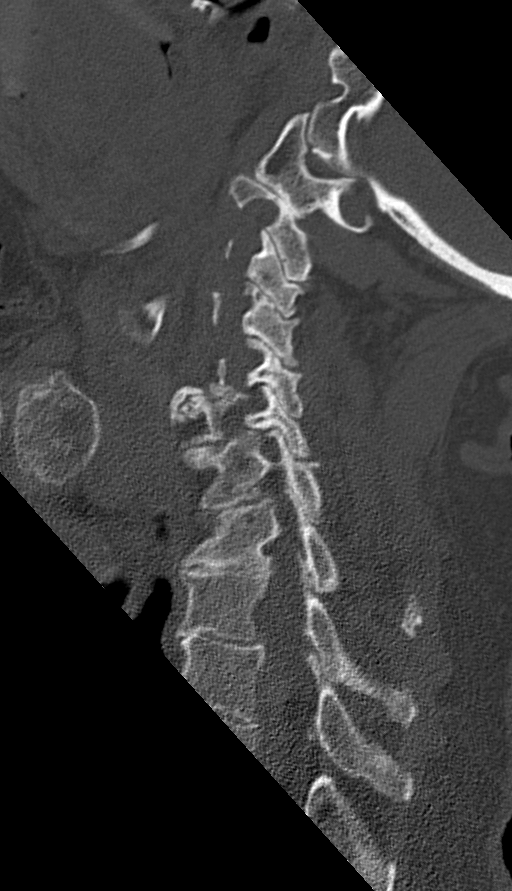
[im 39/59  bone]
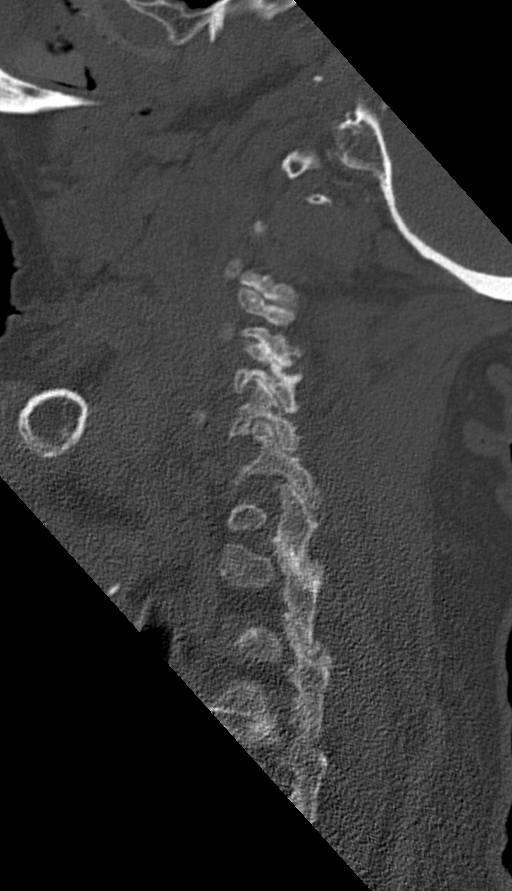

[12 of 27 positions shown; findings below may reference images not displayed]

FINDINGS: Alignment: 3 mm of anterolisthesis of C4 on C5 without associated
fracture or facet displacement. Trace anterolisthesis of T2 on T3
without associated fracture or facet displacement. Relative
straightening of the normal cervical lordosis.

Skull base and vertebrae: No acute fracture line identified.
Craniocervical junction maintains alignment.

Facets remain aligned.

Irregularity of the dens with associated pannus formation and no
fracture line identified.

Soft tissues and spinal canal: No canal hematoma. Unremarkable
appearance of the paraspinal soft tissues.

Disc levels:  Multilevel degenerative changes of the cervical spine.

Most advanced degenerative changes at the level of C5-C6, C6-C7,
C7-T1 with disc space narrowing, endplate sclerosis, vacuum disc
phenomenon and associated uncovertebral joint disease.

More mild degenerative changes at C2-C3, C3-C4, and C4-C5.

Upper chest: Calcifications of the aortic arch. Small left pleural
effusion.

Other: None
IMPRESSION: CT is negative for acute fracture or malalignment of the cervical
spine.

Approximately 3 mm of anterolisthesis of C4 on C5, favored to be
degenerative with no acute fracture identified.

Advanced degenerative disc disease of the cervical spine which is
most pronounced in the C5-T1 levels..

Irregularity of the dens favored to be degenerative with the pannus
formation.

Small left pleural effusion incidentally imaged.

## 2020-03-09 ENCOUNTER — Inpatient Hospital Stay
Admission: EM | Admit: 2020-03-09 | Discharge: 2020-03-15 | DRG: 291 | Disposition: A | Discharge status: Skilled Nursing Facility | Payer: Medicare Other | Attending: Internal Medicine | Admitting: Internal Medicine

## 2020-03-09 ENCOUNTER — Encounter: Payer: Self-pay | Admitting: Emergency Medicine

## 2020-03-09 ENCOUNTER — Emergency Department: Payer: Medicare Other

## 2020-03-09 DIAGNOSIS — I4819 Other persistent atrial fibrillation: Secondary | ICD-10-CM | POA: Diagnosis present

## 2020-03-09 DIAGNOSIS — Z808 Family history of malignant neoplasm of other organs or systems: Secondary | ICD-10-CM

## 2020-03-09 DIAGNOSIS — Z8261 Family history of arthritis: Secondary | ICD-10-CM

## 2020-03-09 DIAGNOSIS — I420 Dilated cardiomyopathy: Secondary | ICD-10-CM | POA: Diagnosis present

## 2020-03-09 DIAGNOSIS — Z823 Family history of stroke: Secondary | ICD-10-CM

## 2020-03-09 DIAGNOSIS — Z803 Family history of malignant neoplasm of breast: Secondary | ICD-10-CM

## 2020-03-09 DIAGNOSIS — F0280 Dementia in other diseases classified elsewhere without behavioral disturbance: Secondary | ICD-10-CM | POA: Diagnosis present

## 2020-03-09 DIAGNOSIS — E669 Obesity, unspecified: Secondary | ICD-10-CM | POA: Diagnosis present

## 2020-03-09 DIAGNOSIS — L03116 Cellulitis of left lower limb: Secondary | ICD-10-CM

## 2020-03-09 DIAGNOSIS — Z79899 Other long term (current) drug therapy: Secondary | ICD-10-CM

## 2020-03-09 DIAGNOSIS — I10 Essential (primary) hypertension: Secondary | ICD-10-CM | POA: Diagnosis present

## 2020-03-09 DIAGNOSIS — R609 Edema, unspecified: Secondary | ICD-10-CM

## 2020-03-09 DIAGNOSIS — Z515 Encounter for palliative care: Secondary | ICD-10-CM

## 2020-03-09 DIAGNOSIS — I482 Chronic atrial fibrillation, unspecified: Secondary | ICD-10-CM

## 2020-03-09 DIAGNOSIS — I110 Hypertensive heart disease with heart failure: Principal | ICD-10-CM | POA: Diagnosis present

## 2020-03-09 DIAGNOSIS — E876 Hypokalemia: Secondary | ICD-10-CM | POA: Diagnosis present

## 2020-03-09 DIAGNOSIS — Z8249 Family history of ischemic heart disease and other diseases of the circulatory system: Secondary | ICD-10-CM

## 2020-03-09 DIAGNOSIS — E785 Hyperlipidemia, unspecified: Secondary | ICD-10-CM | POA: Diagnosis present

## 2020-03-09 DIAGNOSIS — I5023 Acute on chronic systolic (congestive) heart failure: Secondary | ICD-10-CM | POA: Diagnosis present

## 2020-03-09 DIAGNOSIS — Z20822 Contact with and (suspected) exposure to covid-19: Secondary | ICD-10-CM | POA: Diagnosis present

## 2020-03-09 DIAGNOSIS — R0902 Hypoxemia: Secondary | ICD-10-CM

## 2020-03-09 DIAGNOSIS — J9601 Acute respiratory failure with hypoxia: Secondary | ICD-10-CM | POA: Diagnosis present

## 2020-03-09 DIAGNOSIS — K219 Gastro-esophageal reflux disease without esophagitis: Secondary | ICD-10-CM | POA: Diagnosis present

## 2020-03-09 DIAGNOSIS — Z880 Allergy status to penicillin: Secondary | ICD-10-CM

## 2020-03-09 DIAGNOSIS — L03115 Cellulitis of right lower limb: Secondary | ICD-10-CM | POA: Diagnosis present

## 2020-03-09 DIAGNOSIS — G309 Alzheimer's disease, unspecified: Secondary | ICD-10-CM | POA: Diagnosis present

## 2020-03-09 DIAGNOSIS — L97929 Non-pressure chronic ulcer of unspecified part of left lower leg with unspecified severity: Secondary | ICD-10-CM | POA: Diagnosis present

## 2020-03-09 DIAGNOSIS — Z888 Allergy status to other drugs, medicaments and biological substances status: Secondary | ICD-10-CM

## 2020-03-09 DIAGNOSIS — Z7984 Long term (current) use of oral hypoglycemic drugs: Secondary | ICD-10-CM

## 2020-03-09 DIAGNOSIS — I509 Heart failure, unspecified: Secondary | ICD-10-CM

## 2020-03-09 DIAGNOSIS — Z79890 Hormone replacement therapy (postmenopausal): Secondary | ICD-10-CM

## 2020-03-09 DIAGNOSIS — I5043 Acute on chronic combined systolic (congestive) and diastolic (congestive) heart failure: Secondary | ICD-10-CM | POA: Diagnosis present

## 2020-03-09 DIAGNOSIS — Z6834 Body mass index (BMI) 34.0-34.9, adult: Secondary | ICD-10-CM

## 2020-03-09 DIAGNOSIS — E039 Hypothyroidism, unspecified: Secondary | ICD-10-CM | POA: Diagnosis present

## 2020-03-09 DIAGNOSIS — Z7901 Long term (current) use of anticoagulants: Secondary | ICD-10-CM

## 2020-03-09 DIAGNOSIS — Z96612 Presence of left artificial shoulder joint: Secondary | ICD-10-CM | POA: Diagnosis present

## 2020-03-09 DIAGNOSIS — Z96653 Presence of artificial knee joint, bilateral: Secondary | ICD-10-CM | POA: Diagnosis present

## 2020-03-09 DIAGNOSIS — L97922 Non-pressure chronic ulcer of unspecified part of left lower leg with fat layer exposed: Secondary | ICD-10-CM

## 2020-03-09 DIAGNOSIS — I081 Rheumatic disorders of both mitral and tricuspid valves: Secondary | ICD-10-CM | POA: Diagnosis present

## 2020-03-09 DIAGNOSIS — F0390 Unspecified dementia without behavioral disturbance: Secondary | ICD-10-CM

## 2020-03-09 DIAGNOSIS — Z7189 Other specified counseling: Secondary | ICD-10-CM

## 2020-03-09 LAB — CBC WITH DIFFERENTIAL/PLATELET
Abs Immature Granulocytes: 0.01 10*3/uL (ref 0.00–0.07)
Basophils Absolute: 0 10*3/uL (ref 0.0–0.1)
Basophils Relative: 1 %
Eosinophils Absolute: 0.3 10*3/uL (ref 0.0–0.5)
Eosinophils Relative: 5 %
HCT: 37.4 % (ref 36.0–46.0)
Hemoglobin: 12.4 g/dL (ref 12.0–15.0)
Immature Granulocytes: 0 %
Lymphocytes Relative: 34 %
Lymphs Abs: 1.9 10*3/uL (ref 0.7–4.0)
MCH: 30.2 pg (ref 26.0–34.0)
MCHC: 33.2 g/dL (ref 30.0–36.0)
MCV: 91.2 fL (ref 80.0–100.0)
Monocytes Absolute: 0.5 10*3/uL (ref 0.1–1.0)
Monocytes Relative: 9 %
Neutro Abs: 2.8 10*3/uL (ref 1.7–7.7)
Neutrophils Relative %: 51 %
Platelets: 185 10*3/uL (ref 150–400)
RBC: 4.1 MIL/uL (ref 3.87–5.11)
RDW: 14.1 % (ref 11.5–15.5)
WBC: 5.5 10*3/uL (ref 4.0–10.5)
nRBC: 0 % (ref 0.0–0.2)

## 2020-03-09 LAB — BASIC METABOLIC PANEL
Anion gap: 8 (ref 5–15)
BUN: 18 mg/dL (ref 8–23)
CO2: 26 mmol/L (ref 22–32)
Calcium: 9 mg/dL (ref 8.9–10.3)
Chloride: 107 mmol/L (ref 98–111)
Creatinine, Ser: 0.68 mg/dL (ref 0.44–1.00)
GFR calc Af Amer: >60 mL/min (ref >60)
GFR calc non Af Amer: >60 mL/min (ref >60)
Glucose, Bld: 89 mg/dL (ref 70–99)
Potassium: 4.2 mmol/L (ref 3.5–5.1)
Sodium: 141 mmol/L (ref 135–145)

## 2020-03-09 LAB — TROPONIN I (HIGH SENSITIVITY)
Troponin I (High Sensitivity): 7 ng/L (ref <18)
Troponin I (High Sensitivity): 8 ng/L (ref <18)

## 2020-03-09 LAB — BRAIN NATRIURETIC PEPTIDE: B Natriuretic Peptide: 285 pg/mL — ABNORMAL HIGH (ref 0.0–100.0)

## 2020-03-09 MED ORDER — APIXABAN 5 MG PO TABS
5.0000 mg | ORAL_TABLET | Freq: Two times a day (BID) | ORAL | Status: DC
  Administered 2020-03-09 – 2020-03-15 (×12): 5 mg via ORAL
  Filled 2020-03-09 (×12): qty 1

## 2020-03-09 MED ORDER — RISPERIDONE 0.5 MG PO TABS
0.5000 mg | ORAL_TABLET | Freq: Every day | ORAL | Status: DC | PRN
  Filled 2020-03-09 (×2): qty 1

## 2020-03-09 MED ORDER — ONDANSETRON HCL 4 MG/2ML IJ SOLN: 4.0000 mg | Freq: Four times a day (QID) | INTRAMUSCULAR | Status: DC | PRN

## 2020-03-09 MED ORDER — QUETIAPINE FUMARATE 25 MG PO TABS
25.0000 mg | ORAL_TABLET | Freq: Every day | ORAL | Status: DC
  Administered 2020-03-09: 25 mg via ORAL
  Filled 2020-03-09: qty 1

## 2020-03-09 MED ORDER — SODIUM CHLORIDE 0.9% FLUSH: 3.0000 mL | INTRAVENOUS | Status: DC | PRN

## 2020-03-09 MED ORDER — ACETAMINOPHEN 325 MG PO TABS: 650.0000 mg | ORAL_TABLET | ORAL | Status: DC | PRN

## 2020-03-09 MED ORDER — VANCOMYCIN HCL IN DEXTROSE 1-5 GM/200ML-% IV SOLN
1000.0000 mg | Freq: Once | INTRAVENOUS | Status: AC
  Administered 2020-03-09: 1000 mg via INTRAVENOUS
  Filled 2020-03-09: qty 200

## 2020-03-09 MED ORDER — DONEPEZIL HCL 5 MG PO TABS
5.0000 mg | ORAL_TABLET | Freq: Every day | ORAL | Status: DC
  Administered 2020-03-09 – 2020-03-15 (×6): 5 mg via ORAL
  Filled 2020-03-09 (×7): qty 1

## 2020-03-09 MED ORDER — SODIUM CHLORIDE 0.9% FLUSH
3.0000 mL | Freq: Two times a day (BID) | INTRAVENOUS | Status: DC
  Administered 2020-03-10 – 2020-03-15 (×11): 3 mL via INTRAVENOUS

## 2020-03-09 MED ORDER — FUROSEMIDE 10 MG/ML IJ SOLN
40.0000 mg | Freq: Two times a day (BID) | INTRAMUSCULAR | Status: DC
  Administered 2020-03-10 – 2020-03-15 (×11): 40 mg via INTRAVENOUS
  Filled 2020-03-09 (×11): qty 4

## 2020-03-09 MED ORDER — VANCOMYCIN HCL IN DEXTROSE 1-5 GM/200ML-% IV SOLN
1000.0000 mg | Freq: Once | INTRAVENOUS | Status: AC
  Administered 2020-03-10: 1000 mg via INTRAVENOUS
  Filled 2020-03-09: qty 200

## 2020-03-09 MED ORDER — SODIUM CHLORIDE 0.9 % IV SOLN
1.0000 g | INTRAVENOUS | Status: DC
  Administered 2020-03-10: 1 g via INTRAVENOUS
  Filled 2020-03-09: qty 10
  Filled 2020-03-09: qty 1

## 2020-03-09 MED ORDER — SODIUM CHLORIDE 0.9 % IV SOLN
250.0000 mL | INTRAVENOUS | Status: DC | PRN
  Administered 2020-03-10: 250 mL via INTRAVENOUS

## 2020-03-09 MED ORDER — SODIUM CHLORIDE 0.9 % IV SOLN
1.0000 g | Freq: Once | INTRAVENOUS | Status: AC
  Administered 2020-03-09: 1 g via INTRAVENOUS
  Filled 2020-03-09: qty 10

## 2020-03-09 MED ORDER — METOPROLOL TARTRATE 25 MG PO TABS
12.5000 mg | ORAL_TABLET | Freq: Two times a day (BID) | ORAL | Status: DC
  Administered 2020-03-10 – 2020-03-15 (×11): 12.5 mg via ORAL
  Filled 2020-03-09 (×12): qty 1

## 2020-03-09 MED ORDER — FUROSEMIDE 10 MG/ML IJ SOLN
40.0000 mg | Freq: Once | INTRAMUSCULAR | Status: AC
  Administered 2020-03-09: 40 mg via INTRAVENOUS
  Filled 2020-03-09: qty 4

## 2020-03-09 NOTE — ED Provider Notes (Signed)
[facility] Emergency Department Provider Note   ____________________________________________   First MD Initiated Contact with Patient 03/09/20 1907     (approximate)  I have reviewed the triage vital signs and the nursing notes.   HISTORY  Chief Complaint Leg Swelling and Shortness of Breath    HPI Cheryl Winters is a 86 y.o. female with past medical history of dementia, hypertension, atrial fibrillation on Eliquis, and CHF who presents to the ED complaining of shortness of breath.  History is limited due to patient's baseline dementia.  Per EMS, staff at [facility] house noticed that patient seemed to be having a harder time breathing and found her O2 sat to be 79% on room air.  She was subsequently placed on 2 L nasal cannula with improvement to 93 to 95%.  Daughter at the bedside states patient's legs have been increasingly swollen, she also notes a wound to the lateral portion of her left lower leg.  She previously took antibiotics for this and it improved, but it has now worsened again with surrounding redness.  Patient states she is feeling well and she has not reportedly had any fevers, cough, or chest pain.        Past Medical History:  Diagnosis Date  . Abdominal pain, right upper quadrant   . Benign essential tremor   . Carpal tunnel syndrome   . Chronic combined systolic and diastolic CHF (congestive heart failure) (HCC)    a. TTE 10/2017: EF 30-35%, diffuse HK, not technically sufficient to allow for LV diastolic function, moderate to severe MR, severely dilated left atrium measuring 54 mm, moderately dilated RV with mild systolic reduction, mildly dilated right atrium, moderate TR, PASP 55 mmHg, moderate left-sided pleural effusion  . Dementia (HCC)    a. mixed vascular and Alzheimer's  . DJD (degenerative joint disease) of knee   . GERD (gastroesophageal reflux disease)   . History of stress test    a. 10/2015 showed no evidence of ischemia,  EF 62%, low risk study  . HTN (hypertension)   . Hyperlipidemia   . Persistent atrial fibrillation (HCC)    a. diagnosed 10/2017; b. CHADS2VASc => 6 (CHF, HTN, age x 2, vascular disease, female); c. on Eliquis  . Syncope and collapse   . Ulnar nerve lesion   . UTI (urinary tract infection)   . Vitamin D deficiency     Patient Active Problem List   Diagnosis Date Noted  . Cellulitis of left lower leg 03/09/2020  . Chronic ulcer of left leg with fat layer exposed (HCC) 03/09/2020  . Hypoxia 03/09/2020  . Acute on chronic systolic (congestive) heart failure (HCC) 03/09/2020  . Acute on chronic systolic CHF (congestive heart failure), NYHA class 3 (HCC) 12/28/2017  . Somnolence 12/28/2017  . Orthostatic hypotension 11/11/2017  . Dilated cardiomyopathy (HCC) 11/03/2017  . Dementia without behavioral disturbance (HCC) 11/03/2017  . Pleural effusion 11/02/2017  . Atrial fibrillation, chronic (HCC) 11/01/2017  . Atrial fibrillation with RVR (HCC) 11/01/2017  . Memory change 06/27/2016  . Acute cystitis without hematuria 11/03/2015  . Lightheadedness 10/31/2015  . Bradycardia 09/19/2014  . Health care maintenance 09/02/2014  . Medicare annual wellness visit, subsequent 08/21/2012  . Tremor, essential 08/21/2012  . Hypertension 11/22/2011  . Hyperlipidemia 11/22/2011    Past Surgical History:  Procedure Laterality Date  . Bilateral Bletharoplastices    . BREAST BIOPSY  1957 and 1962   x 2, Nml per pt  . BREAST CYST EXCISION  x 2 - nml path per pt  . CARPAL TUNNEL RELEASE  2004  . CATARACT EXTRACTION    . TOTAL KNEE ARTHROPLASTY     bilateral, total, Marcy Panning  . TOTAL SHOULDER REPLACEMENT  08/2010   left  . TUBAL LIGATION    . UMBILICAL HERNIA REPAIR    . VAGINAL DELIVERY     1 set Twins/2 single births    Prior to Admission medications   Medication Sig Start Date End Date Taking? Authorizing Provider  alum & mag hydroxide-simeth (MAALOX/MYLANTA) 200-200-20 MG/5ML  suspension Take 30 mLs by mouth every 6 (six) hours as needed for indigestion or heartburn. 11/04/17  Yes Shaune Pollack, MD  ammonium lactate (LAC-HYDRIN) 12 % lotion Apply 1 application topically 2 (two) times daily. (apply to the lower legs)   Yes [provider]  apixaban (ELIQUIS) 5 MG TABS tablet Take 1 tablet (5 mg total) by mouth 2 (two) times daily. 05/25/18  Yes Iran Ouch, MD  Cyanocobalamin (B-12) 1000 MCG TABS Take 1,000 mcg by mouth daily.   Yes [provider]  donepezil (ARICEPT) 5 MG tablet Take 5 mg by mouth at bedtime.   Yes [provider]  escitalopram (LEXAPRO) 5 MG tablet Take 5 mg by mouth daily.   Yes [provider]  feeding supplement, ENSURE ENLIVE, (ENSURE ENLIVE) LIQD Take 237 mLs by mouth 2 (two) times daily between meals. 12/30/17  Yes Mody, Patricia Pesa, MD  levothyroxine (SYNTHROID, LEVOTHROID) 75 MCG tablet Take 75 mcg by mouth daily before breakfast.    Yes [provider]  Melatonin 5 MG TABS Take 5 mg by mouth at bedtime.    Yes [provider]  metoprolol tartrate (LOPRESSOR) 25 MG tablet Take 0.5 tablets (12.5 mg total) by mouth 2 (two) times daily. 10/31/18 03/09/20 Yes Emily Filbert, MD  potassium chloride (K-DUR,KLOR-CON) 10 MEQ tablet Take 1 tablet (10 mEq total) by mouth 2 (two) times daily. 11/17/17  Yes Iran Ouch, MD  QUEtiapine (SEROQUEL) 25 MG tablet Take 25 mg by mouth at bedtime.   Yes [provider]  risperiDONE (RISPERDAL) 0.5 MG tablet Take 1 tablet (0.5 mg total) by mouth 2 (two) times daily as needed (aggitation). Patient taking differently: Take 0.5 mg by mouth daily as needed (agitation).  12/30/17  Yes Mody, Patricia Pesa, MD  risperiDONE (RISPERDAL) 0.5 MG tablet Take 0.25 mg by mouth 2 (two) times daily.   Yes [provider]  torsemide (DEMADEX) 20 MG tablet Take 1 tablet (20 mg total) by mouth 2 (two) times daily. 05/25/18 03/09/20 Yes Iran Ouch, MD   neomycin-bacitracin-polymyxin (NEOSPORIN) [phone] ointment Apply 1 application topically daily.     [provider]    Allergies Amlodipine, Amlodipine besylate, Amoxicillin, Amoxicillin-pot clavulanate, Penicillins, Codeine, and Codeine sulfate  Family History  Problem Relation Age of Onset  . Hypertension Mother   . Arthritis Mother   . Stroke Mother   . Dementia Mother   . Early death Father 89       from brain cancer  . Brain cancer Father 72  . Breast cancer Maternal Aunt   . Alcohol abuse Other     Social History Social History   Tobacco Use  . Smoking status: Never Smoker  . Smokeless tobacco: Never Used  Substance Use Topics  . Alcohol use: No  . Drug use: No    Review of Systems Unable to obtain secondary to dementia  ____________________________________________   PHYSICAL EXAM:  VITAL SIGNS: ED Triage Vitals  Enc Vitals Group     BP 03/09/20 1856 122/89     Pulse Rate 03/09/20 1856 85     Resp 03/09/20 1856 (!) 23     Temp 03/09/20 1856 97.9 F (36.6 C)     Temp src --      SpO2 03/09/20 1847 94 %     Weight 03/09/20 1857 204 lb (92.5 kg)     Height 03/09/20 1857 5\' 2"  (1.575 m)     Head Circumference --      Peak Flow --      Pain Score 03/09/20 1857 0     Pain Loc --      Pain Edu? --      Excl. in GC? --     Constitutional: Awake and alert. Eyes: Conjunctivae are normal. Head: Atraumatic. Nose: No congestion/rhinnorhea. Mouth/Throat: Mucous membranes are moist. Neck: Normal ROM Cardiovascular: Normal rate, regular rhythm. Grossly normal heart sounds. Respiratory: Mild tachypnea with normal respiratory effort.  No retractions. Lungs with crackles to bilateral bases. Gastrointestinal: Soft and nontender. No distention. Genitourinary: deferred Musculoskeletal: 2+ pitting edema to knees bilaterally.  Wound to left lateral lower leg with surrounding erythema, warmth, and tenderness.  Purulent drainage noted from center of  wound. Neurologic:  Normal speech and language. No gross focal neurologic deficits are appreciated. Skin:  Skin is warm, dry and intact. No rash noted. Psychiatric: Mood and affect are normal. Speech and behavior are normal.  ____________________________________________   LABS (all labs ordered are listed, but only abnormal results are displayed)  Labs Reviewed  BRAIN NATRIURETIC PEPTIDE - Abnormal; Notable for the following components:      Result Value   B Natriuretic Peptide 285.0 (*)    All other components within normal limits  SARS CORONAVIRUS 2 (TAT 6-24 HRS)  BASIC METABOLIC PANEL  CBC WITH DIFFERENTIAL/PLATELET  BASIC METABOLIC PANEL  TROPONIN I (HIGH SENSITIVITY)  TROPONIN I (HIGH SENSITIVITY)   ____________________________________________  EKG  ED ECG REPORT I, 03/11/20, the attending physician, personally viewed and interpreted this ECG.   Date: 03/09/2020  EKG Time: 21:04  Rate: 122  Rhythm: atrial fibrillation, rate 122  Axis: Normal  Intervals:none  ST&T Change: None  PROCEDURES  Procedure(s) performed (including Critical Care):  Procedures   ____________________________________________   INITIAL IMPRESSION / ASSESSMENT AND PLAN / ED COURSE       86 year old female with history of hypertension, atrial fibrillation on Eliquis, CHF, and dementia presents to the ED with increased leg swelling and difficulty breathing noted at [facility] house, found to have O2 sats of 79% on room air.  She is mildly tachypneic but not in any significant respiratory distress here, O2 sats remained stable on 2 L nasal cannula.  Presentation appears most consistent with CHF exacerbation, will check EKG, labs, chest x-ray.  She will likely require admission for diuresis.  She also has wound to her left lower leg that appears infected, previously treated with antibiotics as an outpatient and would likely benefit from treatment with IV antibiotics.  EKG shows atrial  fibrillation with RVR, no acute ischemic changes.  BNP is elevated with chest x-ray changes concerning for CHF exacerbation, will diurese with Lasix.  Plan to start Rocephin and vancomycin for cellulitis and case was discussed with hospitalist for admission.      ____________________________________________   FINAL CLINICAL IMPRESSION(S) / ED DIAGNOSES  Final diagnoses:  Acute on chronic congestive heart failure, unspecified heart failure type (HCC)  Cellulitis of left lower extremity     ED Discharge Orders    None       Note:  This document was prepared using Dragon voice recognition software and may include unintentional dictation errors.   Blake Divine, MD 03/10/20 [phone]

## 2020-03-09 NOTE — ED Notes (Signed)
Cheryl Winters, med tech, at [facility], updated for POC

## 2020-03-09 NOTE — ED Triage Notes (Signed)
Pt to ED by EMS from [facility] after staff stated that Pt's O2 was at 79% and changes in breathing were noted. Per EMS pt was at 90% in route and pt placed on 2L and Sat level 93-95%. Pt also presents with bilateral leg swelling with pitting edema. Pt denies SOB.

## 2020-03-09 NOTE — H&P (Signed)
History and Physical    Cheryl Winters KXF:[id number] DOB: December 17, 1933 DOA: 03/09/2020  PCP: Almetta Lov[facility], [facility]   Patient coming from: [facility]   I have personally briefly reviewed patient's old medical records in [facility]  Chief Complaint: Shortness of breath  HPI: Cheryl Winters is a 86 y.o. female with medical history significant for CHF, A. fib on Eliquis, dementia, hypothyroidism who was sent to the emergency room for evaluation of shortness of breath.  Patient was noted to have increased work of breathing beyond her baseline with O2 sat of 79% which improved to the mid 90s on O2 at 2 L.  She was also noted to have lower extremity edema.  She denied chest pain.  Denied palpitations.  Denied cough fever or chills.  ED Course: On arrival, she was satting at 100% on 2 L with otherwise normal vitals.  BNP elevated at 285, troponin was 7.  Other blood work unremarkable.  Chest x-ray showed a new small right pleural effusion.  She was treated with IV Lasix and hospitalist consulted for admission.  Patient was also noted to have redness and warmth of the left lower extremity as well as an ulcer lateral lower leg and was started on Rocephin and vancomycin.  Review of Systems: As per HPI otherwise 10 point review of systems negative.    Past Medical History:  Diagnosis Date  . Abdominal pain, right upper quadrant   . Benign essential tremor   . Carpal tunnel syndrome   . Chronic combined systolic and diastolic CHF (congestive heart failure) (HCC)    a. TTE 10/2017: EF 30-35%, diffuse HK, not technically sufficient to allow for LV diastolic function, moderate to severe MR, severely dilated left atrium measuring 54 mm, moderately dilated RV with mild systolic reduction, mildly dilated right atrium, moderate TR, PASP 55 mmHg, moderate left-sided pleural effusion  . Dementia (HCC)    a. mixed vascular and Alzheimer's  . DJD (degenerative joint disease) of knee   .  GERD (gastroesophageal reflux disease)   . History of stress test    a. 10/2015 showed no evidence of ischemia, EF 62%, low risk study  . HTN (hypertension)   . Hyperlipidemia   . Persistent atrial fibrillation (HCC)    a. diagnosed 10/2017; b. CHADS2VASc => 6 (CHF, HTN, age x 2, vascular disease, female); c. on Eliquis  . Syncope and collapse   . Ulnar nerve lesion   . UTI (urinary tract infection)   . Vitamin D deficiency     Past Surgical History:  Procedure Laterality Date  . Bilateral Bletharoplastices    . BREAST BIOPSY  1957 and 1962   x 2, Nml per pt  . BREAST CYST EXCISION     x 2 - nml path per pt  . CARPAL TUNNEL RELEASE  2004  . CATARACT EXTRACTION    . TOTAL KNEE ARTHROPLASTY     bilateral, total, Marcy Panning  . TOTAL SHOULDER REPLACEMENT  08/2010   left  . TUBAL LIGATION    . UMBILICAL HERNIA REPAIR    . VAGINAL DELIVERY     1 set Twins/2 single births     reports that she has never smoked. She has never used smokeless tobacco. She reports that she does not drink alcohol or use drugs.  Allergies  Allergen Reactions  . Amlodipine Other (See Comments)    malaise malaise malaise  . Amlodipine Besylate Other (See Comments)  . Amoxicillin Other (See Comments)  .  Amoxicillin-Pot Clavulanate Other (See Comments)    Other reaction(s): Other (See Comments) Cannot remember. Cannot remember.  . Penicillins Other (See Comments)    Has patient had a PCN reaction causing immediate rash, facial/tongue/throat swelling, SOB or lightheadedness with hypotension: Unknown Has patient had a PCN reaction causing severe rash involving mucus membranes or skin necrosis: Unknown Has patient had a PCN reaction that required hospitalization: Unknown Has patient had a PCN reaction occurring within the last 10 years: No If all of the above answers are "NO", then may proceed with Cephalosporin use. Cannot remember.  . Codeine Rash and Other (See Comments)  . Codeine Sulfate Rash     Family History  Problem Relation Age of Onset  . Hypertension Mother   . Arthritis Mother   . Stroke Mother   . Dementia Mother   . Early death Father 24       from brain cancer  . Brain cancer Father 59  . Breast cancer Maternal Aunt   . Alcohol abuse Other      Prior to Admission medications   Medication Sig Start Date End Date Taking? Authorizing Provider  alum & mag hydroxide-simeth (MAALOX/MYLANTA) 200-200-20 MG/5ML suspension Take 30 mLs by mouth every 6 (six) hours as needed for indigestion or heartburn. 11/04/17   Shaune Pollack, MD  ammonium lactate (LAC-HYDRIN) 12 % lotion Apply 1 application topically 2 (two) times daily. (apply to the lower legs)    [provider]  apixaban (ELIQUIS) 5 MG TABS tablet Take 1 tablet (5 mg total) by mouth 2 (two) times daily. 05/25/18   Iran Ouch, MD  Cyanocobalamin (B-12) 1000 MCG TABS Take 1,000 mcg by mouth daily.    [provider]  donepezil (ARICEPT) 5 MG tablet Take 5 mg by mouth at bedtime.    [provider]  escitalopram (LEXAPRO) 5 MG tablet Take 5 mg by mouth daily.    [provider]  feeding supplement, ENSURE ENLIVE, (ENSURE ENLIVE) LIQD Take 237 mLs by mouth 2 (two) times daily between meals. 12/30/17   Adrian Saran, MD  levothyroxine (SYNTHROID, LEVOTHROID) 75 MCG tablet Take 75 mcg by mouth daily before breakfast.     [provider]  Melatonin 5 MG TABS Take 5 mg by mouth at bedtime.     [provider]  metoprolol tartrate (LOPRESSOR) 25 MG tablet Take 0.5 tablets (12.5 mg total) by mouth 2 (two) times daily. 10/31/18 01/29/19  Emily Filbert, MD  neomycin-bacitracin-polymyxin (NEOSPORIN) [phone] ointment Apply 1 application topically daily.     [provider]  potassium chloride (K-DUR,KLOR-CON) 10 MEQ tablet Take 1 tablet (10 mEq total) by mouth 2 (two) times daily. 11/17/17   Iran Ouch, MD  QUEtiapine (SEROQUEL) 25 MG tablet Take 25 mg by  mouth at bedtime.    [provider]  risperiDONE (RISPERDAL) 0.5 MG tablet Take 1 tablet (0.5 mg total) by mouth 2 (two) times daily as needed (aggitation). Patient taking differently: Take 0.5 mg by mouth daily as needed (agitation).  12/30/17   Adrian Saran, MD  risperiDONE (RISPERDAL) 0.5 MG tablet Take 0.25 mg by mouth 2 (two) times daily.    [provider]  torsemide (DEMADEX) 20 MG tablet Take 1 tablet (20 mg total) by mouth 2 (two) times daily. 05/25/18 10/31/18  Iran Ouch, MD    Physical Exam: Vitals:   03/09/20 1847 03/09/20 1856 03/09/20 1857 03/09/20 2017  BP:  122/89  116/63  Pulse:  85  (!)  105  Resp:  (!) 23  20  Temp:  97.9 F (36.6 C)    SpO2: 94% (!) 83%  100%  Weight:   92.5 kg   Height:   5\' 2"  (1.575 m)      Vitals:   03/09/20 1847 03/09/20 1856 03/09/20 1857 03/09/20 2017  BP:  122/89  116/63  Pulse:  85  (!) 105  Resp:  (!) 23  20  Temp:  97.9 F (36.6 C)    SpO2: 94% (!) 83%  100%  Weight:   92.5 kg   Height:   5\' 2"  (1.575 m)     Constitutional: Somnolent but arousable and will answer questions, oriented x2, not in any acute distress. Eyes: PERLA, EOMI, irises appear normal, anicteric sclera,  ENMT: external ears and nose appear normal, normal hearing          Neck: neck appears normal, no masses, normal ROM, no thyromegaly, no JVD  CVS: S1-S2 clear, no murmur rubs or gallops,  , no carotid bruits, pedal pulses palpable, 2+ pitting edema  respiratory:  clear to auscultation bilaterally, no wheezing, rales or rhonchi. Respiratory effort normal. No accessory muscle use.  Abdomen: soft nontender, nondistended, normal bowel sounds, no hepatosplenomegaly, no hernias Musculoskeletal: : no cyanosis, clubbing , no contractures or atrophy Neuro: Cranial nerves II-XII intact, sensation, reflexes normal, strength Psych: judgement and insight appear normal, stable mood and affect,  Skin:  ulcer left lower leg measuring about 2.0x0.5x 0.2cm  with surrounding redness warmth and tenderness  Labs on Admission: I have personally reviewed following labs and imaging studies  CBC: Recent Labs  Lab 03/09/20 1909  WBC 5.5  NEUTROABS 2.8  HGB 12.4  HCT 37.4  MCV 91.2  PLT 790   Basic Metabolic Panel: Recent Labs  Lab 03/09/20 1909  NA 141  K 4.2  CL 107  CO2 26  GLUCOSE 89  BUN 18  CREATININE 0.68  CALCIUM 9.0   GFR: Estimated Creatinine Clearance: 55.5 mL/min (by C-G formula based on SCr of 0.68 mg/dL). Liver Function Tests: No results for input(s): AST, ALT, ALKPHOS, BILITOT, PROT, ALBUMIN in the last 168 hours. No results for input(s): LIPASE, AMYLASE in the last 168 hours. No results for input(s): AMMONIA in the last 168 hours. Coagulation Profile: No results for input(s): INR, PROTIME in the last 168 hours. Cardiac Enzymes: No results for input(s): CKTOTAL, CKMB, CKMBINDEX, TROPONINI in the last 168 hours. BNP (last 3 results) No results for input(s): PROBNP in the last 8760 hours. HbA1C: No results for input(s): HGBA1C in the last 72 hours. CBG: No results for input(s): GLUCAP in the last 168 hours. Lipid Profile: No results for input(s): CHOL, HDL, LDLCALC, TRIG, CHOLHDL, LDLDIRECT in the last 72 hours. Thyroid Function Tests: No results for input(s): TSH, T4TOTAL, FREET4, T3FREE, THYROIDAB in the last 72 hours. Anemia Panel: No results for input(s): VITAMINB12, FOLATE, FERRITIN, TIBC, IRON, RETICCTPCT in the last 72 hours. Urine analysis:    Component Value Date/Time   COLORURINE YELLOW 10/31/2018 [place] 10/31/2018 1155   APPEARANCEUR Clear 09/11/2014 2228   LABSPEC 1.015 10/31/2018 1155   LABSPEC 1.005 09/11/2014 2228   PHURINE 6.5 10/31/2018 1155   GLUCOSEU NEGATIVE 10/31/2018 1155   GLUCOSEU NEGATIVE 07/06/2016 0816   HGBUR NEGATIVE 10/31/2018 [place] 10/31/2018 1155   BILIRUBINUR neg 10/31/2015 0808   BILIRUBINUR Negative 09/11/2014 [place] 10/31/2018 1155   PROTEINUR 30 (A) 10/31/2018  1155   UROBILINOGEN 0.2 07/06/2016 0816   NITRITE NEGATIVE 10/31/2018 1155   LEUKOCYTESUR NEGATIVE 10/31/2018 1155   LEUKOCYTESUR 2+ 09/11/2014 2228    Radiological Exams on Admission: DG Chest Portable 1 View  Result Date: 03/09/2020 CLINICAL DATA:  Shortness of breath. EXAM: PORTABLE CHEST 1 VIEW COMPARISON:  October 24, 2018 FINDINGS: Stable cardiomegaly. New small right effusion with underlying atelectasis. Effusion with underlying opacity in left base, unchanged. No overt edema. No other interval changes. IMPRESSION: New small right effusion with underlying atelectasis. Stable opacity and probable associated effusion in the left base. Stable cardiomegaly. No other change. Electronically Signed   By: Gerome Sam III M.D   On: 03/09/2020 19:29    EKG: Independently reviewed.   Assessment/Plan Principal Problem:   Acute on chronic systolic CHF (congestive heart failure), NYHA class 3 secondary to dilated cardiomyopathy   Hypoxia -Patient with known history of systolic heart failure last EF 30 to 35% presenting with increased work of breathing and hypoxia of 79% on room air at her residence.  BNP 285.  Chest x-ray showing new small right pleural effusion -Patient was hospitalized in 2019 and underwent thoracocentesis for left pleural effusion -Continue IV Lasix -Continue home beta-blocker.  Not currently on ACE/ARB for unclear reason pending verification -Daily weights.  Intake and output monitoring -Echocardiogram in the a.m.    Cellulitis of left lower leg   Chronic ulcer of left leg with fat layer exposed (HCC) -Patient presents with redness left lower extremity surrounding also on lateral aspect of left lower leg, about 10 cm below knee -Continue Rocephin and vancomycin -Wound care -Keep leg elevated    Hypertension -Continue home metoprolol    Atrial fibrillation, chronic (HCC) -Continue metoprolol for rate  control -Continue apixaban for stroke prevention    Dementia without behavioral disturbance (HCC) -Continue Risperdal and quetiapine  DVT prophylaxis: Apixaban Code Status: full code  Family Communication: Daughter at bedside Disposition Plan: Back to previous home environment Consults called: none  Status:obs    Andris Baumann MD [facility]     03/09/2020, 8:49 PM

## 2020-03-09 NOTE — ED Notes (Signed)
Pt cleaned on stool and new diaper applied, pt daughter, Almira Coaster, at bedside

## 2020-03-10 ENCOUNTER — Observation Stay: Payer: Medicare Other

## 2020-03-10 ENCOUNTER — Observation Stay
Admit: 2020-03-10 | Discharge: 2020-03-10 | Disposition: A | Discharge status: Home / Self Care | Payer: Medicare Other | Attending: Internal Medicine | Admitting: Internal Medicine

## 2020-03-10 ENCOUNTER — Encounter: Payer: Self-pay | Admitting: Internal Medicine

## 2020-03-10 ENCOUNTER — Other Ambulatory Visit: Payer: Self-pay

## 2020-03-10 DIAGNOSIS — I420 Dilated cardiomyopathy: Secondary | ICD-10-CM | POA: Diagnosis present

## 2020-03-10 DIAGNOSIS — Z515 Encounter for palliative care: Secondary | ICD-10-CM | POA: Diagnosis not present

## 2020-03-10 DIAGNOSIS — E876 Hypokalemia: Secondary | ICD-10-CM | POA: Diagnosis present

## 2020-03-10 DIAGNOSIS — Z20822 Contact with and (suspected) exposure to covid-19: Secondary | ICD-10-CM | POA: Diagnosis present

## 2020-03-10 DIAGNOSIS — Z7901 Long term (current) use of anticoagulants: Secondary | ICD-10-CM | POA: Diagnosis not present

## 2020-03-10 DIAGNOSIS — E039 Hypothyroidism, unspecified: Secondary | ICD-10-CM | POA: Diagnosis present

## 2020-03-10 DIAGNOSIS — I4819 Other persistent atrial fibrillation: Secondary | ICD-10-CM | POA: Diagnosis present

## 2020-03-10 DIAGNOSIS — Z888 Allergy status to other drugs, medicaments and biological substances status: Secondary | ICD-10-CM | POA: Diagnosis not present

## 2020-03-10 DIAGNOSIS — E669 Obesity, unspecified: Secondary | ICD-10-CM | POA: Diagnosis present

## 2020-03-10 DIAGNOSIS — I509 Heart failure, unspecified: Secondary | ICD-10-CM

## 2020-03-10 DIAGNOSIS — K219 Gastro-esophageal reflux disease without esophagitis: Secondary | ICD-10-CM | POA: Diagnosis present

## 2020-03-10 DIAGNOSIS — I110 Hypertensive heart disease with heart failure: Secondary | ICD-10-CM | POA: Diagnosis present

## 2020-03-10 DIAGNOSIS — Z96612 Presence of left artificial shoulder joint: Secondary | ICD-10-CM | POA: Diagnosis present

## 2020-03-10 DIAGNOSIS — E785 Hyperlipidemia, unspecified: Secondary | ICD-10-CM | POA: Diagnosis present

## 2020-03-10 DIAGNOSIS — Z880 Allergy status to penicillin: Secondary | ICD-10-CM | POA: Diagnosis not present

## 2020-03-10 DIAGNOSIS — G309 Alzheimer's disease, unspecified: Secondary | ICD-10-CM | POA: Diagnosis present

## 2020-03-10 DIAGNOSIS — F0150 Vascular dementia without behavioral disturbance: Secondary | ICD-10-CM | POA: Diagnosis not present

## 2020-03-10 DIAGNOSIS — L03116 Cellulitis of left lower limb: Secondary | ICD-10-CM | POA: Diagnosis present

## 2020-03-10 DIAGNOSIS — J9601 Acute respiratory failure with hypoxia: Secondary | ICD-10-CM | POA: Diagnosis present

## 2020-03-10 DIAGNOSIS — I081 Rheumatic disorders of both mitral and tricuspid valves: Secondary | ICD-10-CM | POA: Diagnosis present

## 2020-03-10 DIAGNOSIS — L97929 Non-pressure chronic ulcer of unspecified part of left lower leg with unspecified severity: Secondary | ICD-10-CM | POA: Diagnosis present

## 2020-03-10 DIAGNOSIS — F0390 Unspecified dementia without behavioral disturbance: Secondary | ICD-10-CM | POA: Diagnosis not present

## 2020-03-10 DIAGNOSIS — I5023 Acute on chronic systolic (congestive) heart failure: Secondary | ICD-10-CM | POA: Diagnosis not present

## 2020-03-10 DIAGNOSIS — F0280 Dementia in other diseases classified elsewhere without behavioral disturbance: Secondary | ICD-10-CM | POA: Diagnosis present

## 2020-03-10 DIAGNOSIS — Z96653 Presence of artificial knee joint, bilateral: Secondary | ICD-10-CM | POA: Diagnosis present

## 2020-03-10 DIAGNOSIS — Z8249 Family history of ischemic heart disease and other diseases of the circulatory system: Secondary | ICD-10-CM | POA: Diagnosis not present

## 2020-03-10 DIAGNOSIS — Z6834 Body mass index (BMI) 34.0-34.9, adult: Secondary | ICD-10-CM | POA: Diagnosis not present

## 2020-03-10 DIAGNOSIS — I5043 Acute on chronic combined systolic (congestive) and diastolic (congestive) heart failure: Secondary | ICD-10-CM | POA: Diagnosis present

## 2020-03-10 DIAGNOSIS — I482 Chronic atrial fibrillation, unspecified: Secondary | ICD-10-CM | POA: Diagnosis not present

## 2020-03-10 DIAGNOSIS — L03115 Cellulitis of right lower limb: Secondary | ICD-10-CM | POA: Diagnosis present

## 2020-03-10 LAB — BASIC METABOLIC PANEL
Anion gap: 8 (ref 5–15)
BUN: 17 mg/dL (ref 8–23)
CO2: 26 mmol/L (ref 22–32)
Calcium: 8.4 mg/dL — ABNORMAL LOW (ref 8.9–10.3)
Chloride: 106 mmol/L (ref 98–111)
Creatinine, Ser: 0.56 mg/dL (ref 0.44–1.00)
GFR calc Af Amer: >60 mL/min (ref >60)
GFR calc non Af Amer: >60 mL/min (ref >60)
Glucose, Bld: 87 mg/dL (ref 70–99)
Potassium: 3.6 mmol/L (ref 3.5–5.1)
Sodium: 140 mmol/L (ref 135–145)

## 2020-03-10 LAB — SARS CORONAVIRUS 2 (TAT 6-24 HRS): SARS Coronavirus 2: NEGATIVE

## 2020-03-10 MED ORDER — LEVOTHYROXINE SODIUM 50 MCG PO TABS
75.0000 ug | ORAL_TABLET | Freq: Every day | ORAL | Status: DC
  Administered 2020-03-11 – 2020-03-15 (×5): 75 ug via ORAL
  Filled 2020-03-10 (×5): qty 1

## 2020-03-10 MED ORDER — ENSURE ENLIVE PO LIQD
237.0000 mL | Freq: Two times a day (BID) | ORAL | Status: DC
  Administered 2020-03-10 – 2020-03-14 (×6): 237 mL via ORAL

## 2020-03-10 MED ORDER — VANCOMYCIN HCL 1250 MG/250ML IV SOLN
1250.0000 mg | INTRAVENOUS | Status: DC
  Filled 2020-03-10: qty 250

## 2020-03-10 MED ORDER — ESCITALOPRAM OXALATE 10 MG PO TABS
5.0000 mg | ORAL_TABLET | Freq: Every day | ORAL | Status: DC
  Administered 2020-03-10 – 2020-03-15 (×6): 5 mg via ORAL
  Filled 2020-03-10 (×7): qty 0.5

## 2020-03-10 MED ORDER — RISPERIDONE 0.25 MG PO TABS
0.2500 mg | ORAL_TABLET | Freq: Two times a day (BID) | ORAL | Status: DC
  Administered 2020-03-10 – 2020-03-15 (×10): 0.25 mg via ORAL
  Filled 2020-03-10 (×11): qty 1

## 2020-03-10 NOTE — Consult Note (Signed)
Pharmacy Antibiotic Note  Cheryl Winters is a 86 y.o. female admitted on 03/09/2020 with shortness of breath and cellulitis of left lower leg.  Pharmacy has been consulted for Vancomycin dosing. Patient received a total of 2g in the ED as a loading dose.  Plan: Vancomycin 1250 mg IV Q 24 hrs. Goal AUC 400-550. Expected AUC: 484.2 Expected Css: 13.0 SCr used: 0.8(actual 0.68)   Height: 5\' 2"  (157.5 cm) Weight: 92.5 kg (204 lb) IBW/kg (Calculated) : 50.1  Temp (24hrs), Avg:97.9 F (36.6 C), Min:97.9 F (36.6 C), Max:97.9 F (36.6 C)  Recent Labs  Lab 03/09/20 1909 03/10/20 0520  WBC 5.5  --   CREATININE 0.68 0.56    Estimated Creatinine Clearance: 55.5 mL/min (by C-G formula based on SCr of 0.56 mg/dL).    Allergies  Allergen Reactions  . Amlodipine Other (See Comments)    malaise malaise malaise  . Amlodipine Besylate Other (See Comments)  . Amoxicillin Other (See Comments)  . Amoxicillin-Pot Clavulanate Other (See Comments)    Other reaction(s): Other (See Comments) Cannot remember. Cannot remember.  . Penicillins Other (See Comments)    Has patient had a PCN reaction causing immediate rash, facial/tongue/throat swelling, SOB or lightheadedness with hypotension: Unknown Has patient had a PCN reaction causing severe rash involving mucus membranes or skin necrosis: Unknown Has patient had a PCN reaction that required hospitalization: Unknown Has patient had a PCN reaction occurring within the last 10 years: No If all of the above answers are "NO", then may proceed with Cephalosporin use. Cannot remember.  . Codeine Rash and Other (See Comments)  . Codeine Sulfate Rash    Antimicrobials this admission: Vancomycin  5/23 >>  Rocephin 5/23 x1   Thank you for allowing pharmacy to be a part of this patient's care.  6/23, PharmD 03/10/2020 8:07 AM

## 2020-03-10 NOTE — ED Notes (Signed)
Patient has bilateral leg swelling, with edema +2. Patient has not c/o pain, no redness noted small cellulitis area on her left buttocks. Patient skin appears to be intact.

## 2020-03-10 NOTE — ED Notes (Signed)
Grayling Congress (daughter) patients POA [phone]

## 2020-03-10 NOTE — Progress Notes (Signed)
[facility] PLAN OF CARE NOTE Patient: Cheryl Winters FOY:[id number]   PCP: [facility] DOB: [birth year]   DOA: 03/09/2020   DOS: 03/10/2020    Patient was admitted by my colleague Dr. Para March earlier on 03/10/2020. I have reviewed the H&P as well as assessment and plan and agree with the same. Important changes in the plan are listed below.  Plan of care: Principal Problem:   Acute on chronic systolic CHF (congestive heart failure), NYHA class 3 (HCC) Active Problems:   Hypertension   Atrial fibrillation, chronic (HCC)   Dilated cardiomyopathy (HCC)   Dementia without behavioral disturbance (HCC)   Cellulitis of left lower leg   Chronic ulcer of left leg with fat layer exposed (HCC)   Hypoxia   Acute on chronic systolic (congestive) heart failure (HCC)   CHF (congestive heart failure) (HCC)   Author: Lynden Oxford, MD [facility] 03/10/2020 7:28 PM   If 7PM-7AM, please contact night-coverage at [email or web address]

## 2020-03-10 NOTE — ED Notes (Signed)
Report given to [place] who is on floor 2A, she asked if we could bring patient at 0900, due to her having to do an extensive workup with another patient

## 2020-03-10 NOTE — Progress Notes (Signed)
PT Cancellation Note  Patient Details Name: Cheryl Winters MRN: [id number] DOB: 09/05/1934   Cancelled Treatment:    Reason Eval/Treat Not Completed: Patient at procedure or test/unavailable(Patient consult received and reviewed. Patient not in room upon PT attempt. Will attempt again at later time/date as available.)  Precious Bard, PT, DPT   03/10/2020, 4:03 PM

## 2020-03-10 NOTE — Progress Notes (Signed)
*  PRELIMINARY RESULTS* Echocardiogram 2D Echocardiogram has been performed.  Cristela Blue 03/10/2020, 10:59 AM

## 2020-03-11 LAB — ECHOCARDIOGRAM COMPLETE
Height: 62 in
Weight: 3065.28 oz

## 2020-03-11 LAB — BASIC METABOLIC PANEL
Anion gap: 9 (ref 5–15)
BUN: 17 mg/dL (ref 8–23)
CO2: 28 mmol/L (ref 22–32)
Calcium: 8.7 mg/dL — ABNORMAL LOW (ref 8.9–10.3)
Chloride: 103 mmol/L (ref 98–111)
Creatinine, Ser: 0.71 mg/dL (ref 0.44–1.00)
GFR calc Af Amer: >60 mL/min (ref >60)
GFR calc non Af Amer: >60 mL/min (ref >60)
Glucose, Bld: 82 mg/dL (ref 70–99)
Potassium: 3.3 mmol/L — ABNORMAL LOW (ref 3.5–5.1)
Sodium: 140 mmol/L (ref 135–145)

## 2020-03-11 LAB — MAGNESIUM: Magnesium: 1.8 mg/dL (ref 1.7–2.4)

## 2020-03-11 LAB — MRSA PCR SCREENING: MRSA by PCR: NEGATIVE

## 2020-03-11 MED ORDER — SODIUM CHLORIDE 0.9 % IV SOLN
2.0000 g | INTRAVENOUS | Status: DC
  Administered 2020-03-11 – 2020-03-13 (×3): 2 g via INTRAVENOUS
  Filled 2020-03-11 (×2): qty 2
  Filled 2020-03-11: qty 20
  Filled 2020-03-11: qty 2

## 2020-03-11 MED ORDER — POTASSIUM CHLORIDE CRYS ER 20 MEQ PO TBCR
40.0000 meq | EXTENDED_RELEASE_TABLET | ORAL | Status: AC
  Administered 2020-03-11 (×2): 40 meq via ORAL
  Filled 2020-03-11: qty 2

## 2020-03-11 NOTE — Plan of Care (Signed)
  Problem: Education: Goal: Knowledge of General Education information will improve Description Including pain rating scale, medication(s)/side effects and non-pharmacologic comfort measures Outcome: Progressing   Problem: Clinical Measurements: Goal: Diagnostic test results will improve Outcome: Progressing   Problem: Safety: Goal: Ability to remain free from injury will improve Outcome: Progressing   Problem: Skin Integrity: Goal: Risk for impaired skin integrity will decrease Outcome: Progressing   Problem: Activity: Goal: Capacity to carry out activities will improve Outcome: Progressing   

## 2020-03-11 NOTE — Evaluation (Signed)
Physical Therapy Evaluation Patient Details Name: Cheryl Winters MRN: [id number] DOB: 1934-03-30 Today's Date: 03/11/2020   History of Present Illness  Per MD notes: Pt is an 86 y.o. female with medical history significant for CHF, A. fib on Eliquis, dementia, hypothyroidism who was sent to the emergency room for evaluation of shortness of breath.  MD assessment includes: Acute on chronic systolic CHF, hypoxia, LLE cellulitis, chronic LLE ulcer, HTN, A-fib, and dementia.    Clinical Impression  Pt was alert to self only but was able to follow 1-step commands occasionally with extra cuing and time to process.  Pt required significant physical assistance with all functional tasks and is at a very high risk for falls.  Per chart review it appears that patient was residing at an ALF but would likely not be safe to return to an ALF based on her current functional limitations. Pt will benefit from PT services in a SNF setting upon discharge to safely address deficits listed in patient problem list for decreased caregiver assistance and eventual return to PLOF.     Follow Up Recommendations SNF    Equipment Recommendations  None recommended by PT    Recommendations for Other Services       Precautions / Restrictions Precautions Precautions: Fall Restrictions Weight Bearing Restrictions: No Other Position/Activity Restrictions: Keep O2 >92%      Mobility  Bed Mobility Overal bed mobility: Needs Assistance Bed Mobility: Supine to Sit;Sit to Supine     Supine to sit: +2 for physical assistance;Max assist Sit to supine: +2 for physical assistance;Max assist   General bed mobility comments: Max A for BLE and trunk control  Transfers Overall transfer level: Needs assistance Equipment used: Rolling walker (2 wheeled) Transfers: Sit to/from Stand Sit to Stand: Mod assist;+2 physical assistance         General transfer comment: Mod A to stand and to prevent posterior LOB while in  standing  Ambulation/Gait Ambulation/Gait assistance: Mod assist;+2 physical assistance Gait Distance (Feet): 1 Feet Assistive device: Rolling walker (2 wheeled) Gait Pattern/deviations: Leaning posteriorly;Step-to pattern Gait velocity: decreased   General Gait Details: Pt able to take several very small shuffling steps forwards at the EOB but was unable to take a backwards step back to the EOB; +2  Mod A to prevent posterior LOB and to assist pt back to sitting at the EOB  Stairs            Wheelchair Mobility    Modified Rankin (Stroke Patients Only)       Balance Overall balance assessment: Needs assistance   Sitting balance-Leahy Scale: Poor Sitting balance - Comments: Posterior instability in sitting that improved during the session     Standing balance-Leahy Scale: Poor Standing balance comment: +2 Mod A to prevent posterior LOB                             Pertinent Vitals/Pain Pain Assessment: No/denies pain    Home Living Family/patient expects to be discharged to:: Assisted living               Home Equipment: Walker - 2 wheels Additional Comments: Pt owns a RW per chart review and verified by pt    Prior Function           Comments: Pt stated that she walks with a walker in her facility, no caregiver available to assist with history     Hand Dominance  Extremity/Trunk Assessment   Upper Extremity Assessment Upper Extremity Assessment: Generalized weakness    Lower Extremity Assessment Lower Extremity Assessment: Generalized weakness       Communication   Communication: No difficulties  Cognition Arousal/Alertness: Awake/alert Behavior During Therapy: Flat affect Overall Cognitive Status: No family/caregiver present to determine baseline cognitive functioning                                 General Comments: Pt unable to provide reliable history but was able to follow 1-step commands 50-75% of  the time with extra cuing and time to process      General Comments      Exercises Total Joint Exercises Ankle Circles/Pumps: AAROM;PROM;Both;5 reps;10 reps Short Arc Quad: AAROM;Both;10 reps;5 reps Heel Slides: AAROM;Both;10 reps;5 reps Hip ABduction/ADduction: AAROM;Both;5 reps;10 reps Straight Leg Raises: AAROM;Both;5 reps;10 reps Other Exercises Other Exercises: Anterior weight shifting in sitting and standing   Assessment/Plan    PT Assessment Patient needs continued PT services  PT Problem List Decreased strength;Decreased balance;Decreased activity tolerance;Decreased mobility       PT Treatment Interventions DME instruction;Gait training;Functional mobility training;Therapeutic activities;Therapeutic exercise;Balance training;Patient/family education    PT Goals (Current goals can be found in the Care Plan section)  Acute Rehab PT Goals PT Goal Formulation: Patient unable to participate in goal setting Time For Goal Achievement: 03/24/20 Potential to Achieve Goals: Fair    Frequency Min 2X/week   Barriers to discharge Inaccessible home environment;Decreased caregiver support      Co-evaluation               AM-PAC PT "6 Clicks" Mobility  Outcome Measure Help needed turning from your back to your side while in a flat bed without using bedrails?: A Lot Help needed moving from lying on your back to sitting on the side of a flat bed without using bedrails?: A Lot Help needed moving to and from a bed to a chair (including a wheelchair)?: A Lot Help needed standing up from a chair using your arms (e.g., wheelchair or bedside chair)?: A Lot Help needed to walk in hospital room?: Total Help needed climbing 3-5 steps with a railing? : Total 6 Click Score: 10    End of Session Equipment Utilized During Treatment: Gait belt Activity Tolerance: Patient tolerated treatment well Patient left: in bed;with call bell/phone within reach Nurse Communication: Mobility  status PT Visit Diagnosis: Unsteadiness on feet (R26.81);Muscle weakness (generalized) (M62.81);Difficulty in walking, not elsewhere classified (R26.2)    Time: 9381-0175 PT Time Calculation (min) (ACUTE ONLY): 28 min   Charges:   PT Evaluation $PT Eval Moderate Complexity: 1 Mod PT Treatments $Therapeutic Exercise: 8-22 mins        D. Royetta Asal PT, DPT 03/11/20, 3:47 PM

## 2020-03-11 NOTE — Progress Notes (Signed)
[facility] Progress Note  Patient: Cheryl Winters    GDJ:[id number]  DOA: 03/09/2020     Date of Service: the patient was seen and examined on 03/11/2020  Chief Complaint  Patient presents with  . Leg Swelling  . Shortness of Breath   Brief hospital course: Past medical history of CHF, A. fib on Eliquis, dementia, hypothyroidism.  Presented with complaints of shortness of breath.  Found to have acute on chronic systolic CHF as well as cellulitis of bilateral lower extremity. Currently further plan is continue diuresis.  Assessment and Plan: 1.  Acute on chronic systolic CHF. Acute hypoxic respiratory failure Continue IV diuresis. Continue home beta-blocker. Echocardiogram actually shows 50 to 60% EF without any new wall motion abnormality but does show grade 1 diastolic dysfunction.  As well as decreased RV function. Lower extremity Doppler negative for DVT.  2.  Cellulitis of lower extremity Currently on IV antibiotics. Transition to oral tomorrow.  3.  Dementia with occasional agitation Patient in on both Risperdal and Seroquel on scheduled basis. Currently I will discontinue Seroquel and continue Risperdal only. Patient also has as needed Risperdal which I will continue. No issues so far in the hospital.  4.  Essential hypertension Blood pressure stable. Continue current regimen.  5.  Chronic A. fib Continue metoprolol continue Eliquis.  6.  Hypothyroidism Continue Synthroid.  7.  Obesity Body mass index is 34.37 kg/m.   Diet: Cardiac diet DVT Prophylaxis: Therapeutic Anticoagulation with Eliquis   Advance goals of care discussion: Full code  Family Communication: no family was present at bedside, at the time of interview.   Disposition:  Status is: Inpatient  Remains inpatient appropriate because:IV treatments appropriate due to intensity of illness or inability to take PO   Dispo: The patient is from: ALF              Anticipated d/c is to:  SNF              Anticipated d/c date is: 1 day              Patient currently is not medically stable to d/c.  Subjective: Continues to have mild shortness of breath.  No nausea no vomiting.  No fever no chills.  Still has swelling in the leg.  No diarrhea no constipation.  Physical Exam: General:  alert oriented to place and person.  Appear in mild distress, affect appropriate Eyes: PERRL ENT: Oral Mucosa Clear, moist  Neck: no JVD,  Cardiovascular: S1 and S2 Present, no Murmur,  Respiratory: increased respiratory effort, Bilateral Air entry equal and Decreased, bilateral  Crackles, no wheezes Abdomen: Bowel Sound present, Soft and no tenderness,  Skin: Bilateral mild erythema and warmth improving, no further rash Extremities: bilateral  Pedal edema, no calf tenderness Neurologic: without any new focal findings Gait not checked due to patient safety concerns  Vitals:   03/11/20 0801 03/11/20 1202 03/11/20 1525 03/11/20 1953  BP: (!) 135/91 117/87 (!) 131/41 (!) 122/99  Pulse: 75 81 78 96  Resp: 16 17 15 20   Temp: 97.8 F (36.6 C) 98.7 F (37.1 C) 98 F (36.7 C) 98.8 F (37.1 C)  TempSrc: Oral Oral Oral Oral  SpO2: 97% 92% 98% 92%  Weight:      Height:        Intake/Output Summary (Last 24 hours) at 03/11/2020 2013 Last data filed at 03/11/2020 1925 Gross per 24 hour  Intake 106.55 ml  Output 2800 ml  Net -2693.45  ml   Filed Weights   03/09/20 1857 03/10/20 0925 03/11/20 0504  Weight: 92.5 kg 86.9 kg 85.2 kg    Data Reviewed: I have personally reviewed and interpreted daily labs, tele strips, imagings as discussed above. I reviewed all nursing notes, pharmacy notes, vitals, pertinent old records I have discussed plan of care as described above with RN and patient/family.  CBC: Recent Labs  Lab 03/09/20 1909  WBC 5.5  NEUTROABS 2.8  HGB 12.4  HCT 37.4  MCV 91.2  PLT 185   Basic Metabolic Panel: Recent Labs  Lab 03/09/20 1909 03/10/20 0520  03/11/20 0347  NA 141 140 140  K 4.2 3.6 3.3*  CL 107 106 103  CO2 26 26 28   GLUCOSE 89 87 82  BUN 18 17 17   CREATININE 0.68 0.56 0.71  CALCIUM 9.0 8.4* 8.7*  MG  --   --  1.8    Studies: No results found.  Scheduled Meds: . apixaban  5 mg Oral BID  . donepezil  5 mg Oral QHS  . escitalopram  5 mg Oral Daily  . feeding supplement (ENSURE ENLIVE)  237 mL Oral BID BM  . furosemide  40 mg Intravenous BID  . levothyroxine  75 mcg Oral QAC breakfast  . metoprolol tartrate  12.5 mg Oral BID  . risperiDONE  0.25 mg Oral BID  . sodium chloride flush  3 mL Intravenous Q12H   Continuous Infusions: . sodium chloride Stopped (03/10/20 2325)  . cefTRIAXone (ROCEPHIN)  IV     PRN Meds: sodium chloride, acetaminophen, ondansetron (ZOFRAN) IV, risperiDONE, sodium chloride flush  Time spent: 35 minutes  Author: , MD [facility] 03/11/2020 8:13 PM  To reach On-call, see care teams to locate the attending and reach out to them via [email or web address]. If 7PM-7AM, please contact night-coverage If you still have difficulty reaching the attending provider, please page the [facility] (Director on Call) for [facility] on amion for assistance.

## 2020-03-11 NOTE — Progress Notes (Signed)
Spoke with Claris Pong Med Tech at [facility] about pt care.

## 2020-03-12 DIAGNOSIS — I10 Essential (primary) hypertension: Secondary | ICD-10-CM

## 2020-03-12 DIAGNOSIS — I509 Heart failure, unspecified: Secondary | ICD-10-CM

## 2020-03-12 LAB — BASIC METABOLIC PANEL
Anion gap: 9 (ref 5–15)
BUN: 16 mg/dL (ref 8–23)
CO2: 29 mmol/L (ref 22–32)
Calcium: 9 mg/dL (ref 8.9–10.3)
Chloride: 101 mmol/L (ref 98–111)
Creatinine, Ser: 0.69 mg/dL (ref 0.44–1.00)
GFR calc Af Amer: >60 mL/min (ref >60)
GFR calc non Af Amer: >60 mL/min (ref >60)
Glucose, Bld: 89 mg/dL (ref 70–99)
Potassium: 3.4 mmol/L — ABNORMAL LOW (ref 3.5–5.1)
Sodium: 139 mmol/L (ref 135–145)

## 2020-03-12 LAB — MAGNESIUM: Magnesium: 1.9 mg/dL (ref 1.7–2.4)

## 2020-03-12 MED ORDER — POTASSIUM CHLORIDE CRYS ER 20 MEQ PO TBCR
40.0000 meq | EXTENDED_RELEASE_TABLET | Freq: Once | ORAL | Status: AC
  Administered 2020-03-12: 40 meq via ORAL
  Filled 2020-03-12: qty 2

## 2020-03-12 NOTE — NC FL2 (Deleted)
[place] MEDICAID FL2 LEVEL OF CARE SCREENING TOOL     IDENTIFICATION  Patient Name: Cheryl Winters Birthdate: 24-Oct-1933 Sex: female Admission Date (Current Location): 03/09/2020  [place] Number:      Facility and Address:  [facility], [address], [place], [place] [address]      Provider Number: [id number]  Attending Physician Name and Address:  Drema Dallas, MD  Relative Name and Phone Number:  Wandra Scot  [phone]    Current Level of Care: Hospital Recommended Level of Care: Skilled Nursing Facility Prior Approval Number:    Date Approved/Denied:   PASRR Number: [id number]  Discharge Plan: SNF    Current Diagnoses: Patient Active Problem List   Diagnosis Date Noted  . CHF (congestive heart failure) (HCC) 03/10/2020  . Cellulitis of left lower leg 03/09/2020  . Chronic ulcer of left leg with fat layer exposed (HCC) 03/09/2020  . Hypoxia 03/09/2020  . Acute on chronic systolic (congestive) heart failure (HCC) 03/09/2020  . Acute on chronic systolic CHF (congestive heart failure), NYHA class 3 (HCC) 12/28/2017  . Somnolence 12/28/2017  . Orthostatic hypotension 11/11/2017  . Dilated cardiomyopathy (HCC) 11/03/2017  . Dementia without behavioral disturbance (HCC) 11/03/2017  . Pleural effusion 11/02/2017  . Atrial fibrillation, chronic (HCC) 11/01/2017  . Atrial fibrillation with RVR (HCC) 11/01/2017  . Memory change 06/27/2016  . Acute cystitis without hematuria 11/03/2015  . Lightheadedness 10/31/2015  . Bradycardia 09/19/2014  . Health care maintenance 09/02/2014  . Medicare annual wellness visit, subsequent 08/21/2012  . Tremor, essential 08/21/2012  . Hypertension 11/22/2011  . Hyperlipidemia 11/22/2011    Orientation RESPIRATION BLADDER Height & Weight     Self, Situation  Normal Incontinent Weight: 85.2 kg Height:  5\' 2"  (157.5 cm)  BEHAVIORAL SYMPTOMS/MOOD NEUROLOGICAL BOWEL NUTRITION STATUS      Continent    AMBULATORY STATUS COMMUNICATION OF NEEDS Skin   Extensive Assist Verbally Normal                       Personal Care Assistance Level of Assistance  Bathing, Dressing, Feeding Bathing Assistance: Maximum assistance Feeding assistance: Independent Dressing Assistance: Maximum assistance     Functional Limitations Info             SPECIAL CARE FACTORS FREQUENCY  PT (By licensed PT), OT (By licensed OT)     PT Frequency: 7x a week OT Frequency: 7x a week            Contractures Contractures Info: Present    Additional Factors Info  Code Status, Allergies Code Status Info: Full Allergies Info: amlodipine, amoxicillin, penicillin, codeine,           Current Medications (03/12/2020):  This is the current hospital active medication list Current Facility-Administered Medications  Medication Dose Route Frequency Provider Last Rate Last Admin  . 0.9 %  sodium chloride infusion  250 mL Intravenous PRN 03/14/2020, MD   Stopped at 03/10/20 2325  . acetaminophen (TYLENOL) tablet 650 mg  650 mg Oral Q4H PRN 03/12/20, MD      . apixaban Andris Baumann) tablet 5 mg  5 mg Oral BID Everlene Balls, MD   5 mg at 03/12/20 0957  . cefTRIAXone (ROCEPHIN) 2 g in sodium chloride 0.9 % 100 mL IVPB  2 g Intravenous Q24H 03/14/20, RPH 200 mL/hr at 03/11/20 2311 2 g at 03/11/20 2311  . donepezil (ARICEPT) tablet 5 mg  5 mg Oral QHS Athena Masse, MD   5 mg at 03/11/20 2312  . escitalopram (LEXAPRO) tablet 5 mg  5 mg Oral Daily Lavina Hamman, MD   5 mg at 03/12/20 0957  . feeding supplement (ENSURE ENLIVE) (ENSURE ENLIVE) liquid 237 mL  237 mL Oral BID BM Lavina Hamman, MD   237 mL at 03/12/20 0958  . furosemide (LASIX) injection 40 mg  40 mg Intravenous BID Athena Masse, MD   40 mg at 03/12/20 0958  . levothyroxine (SYNTHROID) tablet 75 mcg  75 mcg Oral QAC breakfast Lavina Hamman, MD   75 mcg at 03/12/20 0556  . metoprolol tartrate (LOPRESSOR)  tablet 12.5 mg  12.5 mg Oral BID Athena Masse, MD   12.5 mg at 03/12/20 0956  . ondansetron (ZOFRAN) injection 4 mg  4 mg Intravenous Q6H PRN Judd Gaudier V, MD      . risperiDONE (RISPERDAL) tablet 0.25 mg  0.25 mg Oral BID Lavina Hamman, MD   0.25 mg at 03/12/20 0956  . risperiDONE (RISPERDAL) tablet 0.5 mg  0.5 mg Oral Daily PRN Judd Gaudier V, MD      . sodium chloride flush (NS) 0.9 % injection 3 mL  3 mL Intravenous Q12H Athena Masse, MD   3 mL at 03/12/20 0959  . sodium chloride flush (NS) 0.9 % injection 3 mL  3 mL Intravenous PRN Athena Masse, MD         Discharge Medications: Please see discharge summary for a list of discharge medications.  Relevant Imaging Results:  Relevant Lab Results:   Additional Information [id number]  Victorino Dike, RN

## 2020-03-12 NOTE — [place] FL2 (Signed)
[place] MEDICAID FL2 LEVEL OF CARE SCREENING TOOL     IDENTIFICATION  Patient Name: Cheryl Winters Birthdate: 24-Oct-1933 Sex: female Admission Date (Current Location): 03/09/2020  [place] Number:      Facility and Address:  [facility], [address], [place], [place] [address]      Provider Number: [id number]  Attending Physician Name and Address:  Drema Dallas, MD  Relative Name and Phone Number:  Wandra Scot  [phone]    Current Level of Care: Hospital Recommended Level of Care: Skilled Nursing Facility Prior Approval Number:    Date Approved/Denied:   PASRR Number: [id number]  Discharge Plan: SNF    Current Diagnoses: Patient Active Problem List   Diagnosis Date Noted  . CHF (congestive heart failure) (HCC) 03/10/2020  . Cellulitis of left lower leg 03/09/2020  . Chronic ulcer of left leg with fat layer exposed (HCC) 03/09/2020  . Hypoxia 03/09/2020  . Acute on chronic systolic (congestive) heart failure (HCC) 03/09/2020  . Acute on chronic systolic CHF (congestive heart failure), NYHA class 3 (HCC) 12/28/2017  . Somnolence 12/28/2017  . Orthostatic hypotension 11/11/2017  . Dilated cardiomyopathy (HCC) 11/03/2017  . Dementia without behavioral disturbance (HCC) 11/03/2017  . Pleural effusion 11/02/2017  . Atrial fibrillation, chronic (HCC) 11/01/2017  . Atrial fibrillation with RVR (HCC) 11/01/2017  . Memory change 06/27/2016  . Acute cystitis without hematuria 11/03/2015  . Lightheadedness 10/31/2015  . Bradycardia 09/19/2014  . Health care maintenance 09/02/2014  . Medicare annual wellness visit, subsequent 08/21/2012  . Tremor, essential 08/21/2012  . Hypertension 11/22/2011  . Hyperlipidemia 11/22/2011    Orientation RESPIRATION BLADDER Height & Weight     Self, Situation  Normal Incontinent Weight: 85.2 kg Height:  5\' 2"  (157.5 cm)  BEHAVIORAL SYMPTOMS/MOOD NEUROLOGICAL BOWEL NUTRITION STATUS      Continent    AMBULATORY STATUS COMMUNICATION OF NEEDS Skin   Extensive Assist Verbally Normal                       Personal Care Assistance Level of Assistance  Bathing, Dressing, Feeding Bathing Assistance: Maximum assistance Feeding assistance: Independent Dressing Assistance: Maximum assistance     Functional Limitations Info             SPECIAL CARE FACTORS FREQUENCY  PT (By licensed PT), OT (By licensed OT)     PT Frequency: 7x a week OT Frequency: 7x a week            Contractures Contractures Info: Present    Additional Factors Info  Code Status, Allergies Code Status Info: Full Allergies Info: amlodipine, amoxicillin, penicillin, codeine,           Current Medications (03/12/2020):  This is the current hospital active medication list Current Facility-Administered Medications  Medication Dose Route Frequency Provider Last Rate Last Admin  . 0.9 %  sodium chloride infusion  250 mL Intravenous PRN 03/14/2020, MD   Stopped at 03/10/20 2325  . acetaminophen (TYLENOL) tablet 650 mg  650 mg Oral Q4H PRN 03/12/20, MD      . apixaban Andris Baumann) tablet 5 mg  5 mg Oral BID Everlene Balls, MD   5 mg at 03/12/20 0957  . cefTRIAXone (ROCEPHIN) 2 g in sodium chloride 0.9 % 100 mL IVPB  2 g Intravenous Q24H 03/14/20, RPH 200 mL/hr at 03/11/20 2311 2 g at 03/11/20 2311  . donepezil (ARICEPT) tablet 5 mg  5 mg Oral QHS Andris Baumann, MD   5 mg at 03/11/20 2312  . escitalopram (LEXAPRO) tablet 5 mg  5 mg Oral Daily Rolly Salter, MD   5 mg at 03/12/20 0957  . feeding supplement (ENSURE ENLIVE) (ENSURE ENLIVE) liquid 237 mL  237 mL Oral BID BM Rolly Salter, MD   237 mL at 03/12/20 0958  . furosemide (LASIX) injection 40 mg  40 mg Intravenous BID Andris Baumann, MD   40 mg at 03/12/20 0958  . levothyroxine (SYNTHROID) tablet 75 mcg  75 mcg Oral QAC breakfast Rolly Salter, MD   75 mcg at 03/12/20 0556  . metoprolol tartrate (LOPRESSOR)  tablet 12.5 mg  12.5 mg Oral BID Andris Baumann, MD   12.5 mg at 03/12/20 0956  . ondansetron (ZOFRAN) injection 4 mg  4 mg Intravenous Q6H PRN Lindajo Royal V, MD      . potassium chloride SA (KLOR-CON) CR tablet 40 mEq  40 mEq Oral Once Drema Dallas, MD      . risperiDONE (RISPERDAL) tablet 0.25 mg  0.25 mg Oral BID Rolly Salter, MD   0.25 mg at 03/12/20 0956  . risperiDONE (RISPERDAL) tablet 0.5 mg  0.5 mg Oral Daily PRN Lindajo Royal V, MD      . sodium chloride flush (NS) 0.9 % injection 3 mL  3 mL Intravenous Q12H Andris Baumann, MD   3 mL at 03/12/20 0959  . sodium chloride flush (NS) 0.9 % injection 3 mL  3 mL Intravenous PRN Andris Baumann, MD         Discharge Medications: Please see discharge summary for a list of discharge medications.  Relevant Imaging Results:  Relevant Lab Results:   Additional Information [id number], leg wound  Shawn Route, RN

## 2020-03-12 NOTE — Progress Notes (Signed)
PROGRESS NOTE    Cheryl Winters  KYH:[id number] DOB: [birth year] DOA: 03/09/2020 PCP: Almetta Lovely, [facility]     Brief Narrative:  86 yo WF.  From ALF, PMHx acute on chronic systolic CHF, A. fib on Eliquis, dementia, hypothyroidism.    Presented with complaints of shortness of breath.  Found to have acute on chronic systolic CHF as well as cellulitis of bilateral lower extremity. Currently further plan is continue diuresis.   Subjective: A/O x1 (does not know where, when, why), follows only some commands.  Does not answer questions appropriately.   Assessment & Plan:   Principal Problem:   Acute on chronic systolic CHF (congestive heart failure), NYHA class 3 (HCC) Active Problems:   Hypertension   Atrial fibrillation, chronic (HCC)   Dilated cardiomyopathy (HCC)   Dementia without behavioral disturbance (HCC)   Cellulitis of left lower leg   Chronic ulcer of left leg with fat layer exposed (HCC)   Hypoxia   Acute on chronic systolic (congestive) heart failure (HCC)   CHF (congestive heart failure) (HCC)   Acute on chronic systolic CHF. -Strict in and out  -Daily weight -Furosemide IV 40 mg BID -Echocardiogram actually shows 50 to 60% EF without any new wall motion abnormality but does show grade 1 diastolic dysfunction.  As well as decreased RV function. -Apixaban 5 mg BID -Metoprolol 12.5 mg BID  Chronic diastolic CHF -See CHF  Chronic atrial fibrillation -See CHF -Currently NSR  Essential HTN -See CHF  Acute hypoxic respiratory failure -Currently on room air  Cellulitis of lower extremity -Currently on IV antibiotics. -Transition to oral tomorrow. -Lower extremity Doppler negative for DVT see results below  Dementia with occasional agitation -Unsure of patient at baseline currently A/O x1 (does not know where, when, why) -Donepezil 5 mg daily -Lexapro 5 mg daily -Risperidone 0.25 mg BID -Risperidone 0.5 mg PRN  Hypothyroidism Continue  Synthroid.  Obesity Body mass index is 34.37 kg/m.  Hypokalemia -Potassium goal 4 -K-Dur 40 mEq      DVT prophylaxis: Apixaban Code Status: Full Family Communication:  Disposition Plan:  Status is: Inpatient    Dispo: The patient is from: Assisted living facility              Anticipated d/c is to: SNF?              Anticipated d/c date is: 5/28              Patient currently stable      Consultants:  Palliative care  Procedures/Significant Events:    I have personally reviewed and interpreted all radiology studies and my findings are as above.  VENTILATOR SETTINGS:    Cultures   Antimicrobials: Anti-infectives (From admission, onward)   Start     Dose/Rate Stop   03/11/20 2200  cefTRIAXone (ROCEPHIN) 2 g in sodium chloride 0.9 % 100 mL IVPB     2 g 200 mL/hr over 30 Minutes     03/10/20 2300  vancomycin (VANCOREADY) IVPB 1250 mg/250 mL  Status:  Discontinued     1,250 mg 166.7 mL/hr over 90 Minutes 03/10/20 1327   03/10/20 2200  cefTRIAXone (ROCEPHIN) 1 g in sodium chloride 0.9 % 100 mL IVPB  Status:  Discontinued     1 g 200 mL/hr over 30 Minutes 03/11/20 1036   03/09/20 2230  vancomycin (VANCOCIN) IVPB 1000 mg/200 mL premix     1,000 mg 200 mL/hr over 60 Minutes 03/10/20 0241   03/09/20 2030  cefTRIAXone (ROCEPHIN) 1 g in sodium chloride 0.9 % 100 mL IVPB     1 g 200 mL/hr over 30 Minutes 03/09/20 2149   03/09/20 2030  vancomycin (VANCOCIN) IVPB 1000 mg/200 mL premix     1,000 mg 200 mL/hr over 60 Minutes 03/09/20 2318       Devices    LINES / TUBES:      Continuous Infusions: . sodium chloride Stopped (03/10/20 2325)  . cefTRIAXone (ROCEPHIN)  IV 2 g (03/11/20 2311)     Objective: Vitals:   03/11/20 1202 03/11/20 1525 03/11/20 1953 03/12/20 0343  BP: 117/87 (!) 131/41 (!) 122/99 123/88  Pulse: 81 78 96 88  Resp: Temp: 98.7 F (37.1 C) 98 F (36.7 C) 98.8 F (37.1 C) 97.6 F (36.4 C)  TempSrc: Oral Oral  Oral Oral  SpO2: 92% 98% 92% 98%  Weight:      Height:        Intake/Output Summary (Last 24 hours) at 03/12/2020 0744 Last data filed at 03/12/2020 0400 Gross per 24 hour  Intake 103 ml  Output 2050 ml  Net -1947 ml   Filed Weights   03/09/20 1857 03/10/20 0925 03/11/20 0504  Weight: 92.5 kg 86.9 kg 85.2 kg    Examination:  General: A/O x1 (does not know where, when, why) no acute respiratory distress Eyes: negative scleral hemorrhage, negative anisocoria, negative icterus ENT: Negative Runny nose, negative gingival bleeding, Neck:  Negative scars, masses, torticollis, lymphadenopathy, JVD Lungs: Clear to auscultation bilaterally without wheezes or crackles Cardiovascular: Regular rate and rhythm without murmur gallop or rub normal S1 and S2 Abdomen: OBESE, negative abdominal pain, nondistended, positive soft, bowel sounds, no rebound, no ascites, no appreciable mass Extremities: No significant cyanosis, clubbing, or edema bilateral lower extremities Skin: Negative rashes, lesions, ulcers Psychiatric:  Negative depression, negative anxiety, negative fatigue, negative mania  Central nervous system:  Cranial nerves II through XII intact, tongue/uvula midline, all upper extremities muscle strength 4 /5, sensation intact throughout, negative dysarthria, positive expressive aphasia, positive receptive aphasia.  .     Data Reviewed: Care during the described time interval was provided by me .  I have reviewed this patient's available data, including medical history, events of note, physical examination, and all test results as part of my evaluation.  CBC: Recent Labs  Lab 03/09/20 1909  WBC 5.5  NEUTROABS 2.8  HGB 12.4  HCT 37.4  MCV 91.2  PLT 185   Basic Metabolic Panel: Recent Labs  Lab 03/09/20 1909 03/10/20 0520 03/11/20 0347 03/12/20 0508  NA 141 140 140 139  K 4.2 3.6 3.3* 3.4*  CL 107 106 103 101  CO2 GLUCOSE 89 87 82 89  BUN CREATININE 0.68 0.56 0.71 0.69  CALCIUM 9.0 8.4* 8.7* 9.0  MG  --   --  1.8 1.9   GFR: Estimated Creatinine Clearance: 53 mL/min (by C-G formula based on SCr of 0.69 mg/dL). Liver Function Tests: No results for input(s): AST, ALT, ALKPHOS, BILITOT, PROT, ALBUMIN in the last 168 hours. No results for input(s): LIPASE, AMYLASE in the last 168 hours. No results for input(s): AMMONIA in the last 168 hours. Coagulation Profile: No results for input(s): INR, PROTIME in the last 168 hours. Cardiac Enzymes: No results for input(s): CKTOTAL, CKMB, CKMBINDEX, TROPONINI in the last 168 hours. BNP (last 3 results) No results for input(s): PROBNP in the last 8760 hours. HbA1C:  No results for input(s): HGBA1C in the last 72 hours. CBG: No results for input(s): GLUCAP in the last 168 hours. Lipid Profile: No results for input(s): CHOL, HDL, LDLCALC, TRIG, CHOLHDL, LDLDIRECT in the last 72 hours. Thyroid Function Tests: No results for input(s): TSH, T4TOTAL, FREET4, T3FREE, THYROIDAB in the last 72 hours. Anemia Panel: No results for input(s): VITAMINB12, FOLATE, FERRITIN, TIBC, IRON, RETICCTPCT in the last 72 hours. Sepsis Labs: No results for input(s): PROCALCITON, LATICACIDVEN in the last 168 hours.  Recent Results (from the past 240 hour(s))  SARS CORONAVIRUS 2 (TAT 6-24 HRS) Nasopharyngeal Nasopharyngeal Swab     Status: None   Collection Time: 03/09/20  9:16 PM   Specimen: Nasopharyngeal Swab  Result Value Ref Range Status   SARS Coronavirus 2 NEGATIVE NEGATIVE Final    Comment: (NOTE) SARS-CoV-2 target nucleic acids are NOT DETECTED. The SARS-CoV-2 RNA is generally detectable in upper and lower respiratory specimens during the acute phase of infection. Negative results do not preclude SARS-CoV-2 infection, do not rule out co-infections with other pathogens, and should not be used as the sole basis for treatment or other patient management decisions. Negative results must be  combined with clinical observations, patient history, and epidemiological information. The expected result is Negative. Fact Sheet for Patients: HairSlick.no Fact Sheet for Healthcare Providers: quierodirigir.com This test is not yet approved or cleared by the [place] FDA and  has been authorized for detection and/or diagnosis of SARS-CoV-2 by FDA under an Emergency Use Authorization (EUA). This EUA will remain  in effect (meaning this test can be used) for the duration of the COVID-19 declaration under Section 56 4(b)(1) of the Act, 21 U.S.C. section 360bbb-3(b)(1), unless the authorization is terminated or revoked sooner. Performed at [facility], [address], [place], [place] [address]   MRSA PCR Screening     Status: None   Collection Time: 03/11/20  6:22 AM   Specimen: Nasopharyngeal  Result Value Ref Range Status   MRSA by PCR NEGATIVE NEGATIVE Final    Comment:        The GeneXpert MRSA Assay (FDA approved for NASAL specimens only), is one component of a comprehensive MRSA colonization surveillance program. It is not intended to diagnose MRSA infection nor to guide or monitor treatment for MRSA infections. Performed at [facility], [address] [place], [place] [address]          Radiology Studies: US Venous Img Lower Bilateral (DVT)  Result Date: 03/10/2020 CLINICAL DATA:  Bilateral lower extremity edema EXAM: BILATERAL LOWER EXTREMITY VENOUS DOPPLER ULTRASOUND TECHNIQUE: Gray-scale sonography with compression, as well as color and duplex ultrasound, were performed to evaluate the deep venous system(s) from the level of the common femoral vein through the popliteal and proximal calf veins. COMPARISON:  01/31/2018 FINDINGS: VENOUS Normal compressibility of the common femoral, superficial femoral, and popliteal veins, as well as the visualized calf veins. Visualized portions of profunda  femoral vein and great saphenous vein unremarkable. No filling defects to suggest DVT on grayscale or color Doppler imaging. Doppler waveforms show normal direction of venous flow, normal respiratory plasticity and response to augmentation. OTHER Mild subcutaneous edema bilateral calves. Limitations: none IMPRESSION: Negative. Electronically Signed   By: Sharlet Salina M.D.   On: 03/10/2020 15:13   ECHOCARDIOGRAM COMPLETE  Result Date: 03/11/2020    ECHOCARDIOGRAM REPORT   Patient Name:   Aneisha STEELE Bray Date of Exam: 03/10/2020 Medical Rec #:  [id number]  Height:       62.0 in Accession #:    [id number]       Weight:       191.6 lb Date of Birth:  06/22/1934        BSA:          1.877 m Patient Age:    86 years         BP:           129/99 mmHg Patient Gender: F                HR:           73 bpm. Exam Location:  [facility] Procedure: 2D Echo, Cardiac Doppler and Color Doppler Indications:     CHF- acute systolic 428.21  History:         Patient has prior history of Echocardiogram examinations, most                  recent 11/03/2017. Signs/Symptoms:Syncope; Risk                  Factors:Hypertension. Persistent Afib.  Sonographer:     Cristela BlueJerry Hege RDCS (AE) Referring Phys:  [id number] Andris BaumannHAZEL V DUNCAN Diagnosing Phys: Alwyn Peawayne D Callwood MD IMPRESSIONS  1. Left ventricular ejection fraction, by estimation, is 55 to 60%. Left ventricular ejection fraction by PLAX is 61 %. The left ventricle has normal function. The left ventricle has no regional wall motion abnormalities. The left ventricular internal cavity size was mildly to moderately dilated. Left ventricular diastolic parameters are consistent with Grade I diastolic dysfunction (impaired relaxation).  2. Right ventricular systolic function is moderately reduced. The right ventricular size is normal. Mildly increased right ventricular wall thickness. There is severely elevated pulmonary artery systolic pressure.  3. Left atrial size was severely dilated.  4. Right  atrial size was mild to moderately dilated.  5. The mitral valve is normal in structure. Mild mitral valve regurgitation.  6. Tricuspid valve regurgitation is mild to moderate.  7. The aortic valve is grossly normal. Aortic valve regurgitation is not visualized. FINDINGS  Left Ventricle: Left ventricular ejection fraction, by estimation, is 55 to 60%. Left ventricular ejection fraction by PLAX is 61 %. The left ventricle has normal function. The left ventricle has no regional wall motion abnormalities. The left ventricular internal cavity size was mildly to moderately dilated. There is no left ventricular hypertrophy. Left ventricular diastolic parameters are consistent with Grade I diastolic dysfunction (impaired relaxation). Right Ventricle: The right ventricular size is normal. Mildly increased right ventricular wall thickness. Right ventricular systolic function is moderately reduced. There is severely elevated pulmonary artery systolic pressure. The tricuspid regurgitant velocity is 3.59 m/s, and with an assumed right atrial pressure of 10 mmHg, the estimated right ventricular systolic pressure is 61.6 mmHg. Left Atrium: Left atrial size was severely dilated. Right Atrium: Right atrial size was mild to moderately dilated. Pericardium: There is no evidence of pericardial effusion. Mitral Valve: The mitral valve is normal in structure. Mild mitral valve regurgitation. Tricuspid Valve: The tricuspid valve is normal in structure. Tricuspid valve regurgitation is mild to moderate. Aortic Valve: The aortic valve is grossly normal. Aortic valve regurgitation is not visualized. Aortic valve mean gradient measures 2.5 mmHg. Aortic valve peak gradient measures 4.8 mmHg. Aortic valve area, by VTI measures 2.48 cm. Pulmonic Valve: The pulmonic valve was grossly normal. Pulmonic valve regurgitation is not visualized. Aorta: The aortic root is normal in size and structure. IAS/Shunts:  No atrial level shunt detected by color  flow Doppler.  LEFT VENTRICLE PLAX 2D LV EF:         Left ventricular ejection fraction by PLAX is 61 %. LVIDd:         3.76 cm LVIDs:         2.56 cm LV PW:         1.26 cm LV IVS:        0.76 cm LVOT diam:     2.00 cm LV SV:         46 LV SV Index:   24 LVOT Area:     3.14 cm  RIGHT VENTRICLE RV Basal diam:  3.49 cm RV S prime:     9.75 cm/s TAPSE (M-mode): 2.7 cm LEFT ATRIUM              Index        RIGHT ATRIUM           Index LA diam:        5.40 cm  2.88 cm/m   RA Area:     23.30 cm LA Vol (A2C):   216.0 ml 115.07 ml/m RA Volume:   66.00 ml  35.16 ml/m LA Vol (A4C):   153.0 ml 81.51 ml/m LA Biplane Vol: 185.0 ml 98.56 ml/m  AORTIC VALVE                   PULMONIC VALVE AV Area (Vmax):    2.16 cm    PV Vmax:        0.56 m/s AV Area (Vmean):   2.26 cm    PV Peak grad:   1.3 mmHg AV Area (VTI):     2.48 cm    RVOT Peak grad: 3 mmHg AV Vmax:           109.00 cm/s AV Vmean:          76.200 cm/s AV VTI:            0.185 m AV Peak Grad:      4.8 mmHg AV Mean Grad:      2.5 mmHg LVOT Vmax:         75.10 cm/s LVOT Vmean:        54.800 cm/s LVOT VTI:          0.146 m LVOT/AV VTI ratio: 0.79  AORTA Ao Root diam: 3.00 cm MITRAL VALVE                TRICUSPID VALVE MV Area (PHT): 4.26 cm     TR Peak grad:   51.6 mmHg MV Decel Time: 178 msec     TR Vmax:        359.00 cm/s MV E velocity: 103.00 cm/s                             SHUNTS                             Systemic VTI:  0.15 m                             Systemic Diam: 2.00 cm Dwayne Prince Rome MD Electronically signed by Yolonda Kida MD Signature Date/Time: 03/11/2020/8:50:08 AM    Final         Scheduled Meds: .  apixaban  5 mg Oral BID  . donepezil  5 mg Oral QHS  . escitalopram  5 mg Oral Daily  . feeding supplement (ENSURE ENLIVE)  237 mL Oral BID BM  . furosemide  40 mg Intravenous BID  . levothyroxine  75 mcg Oral QAC breakfast  . metoprolol tartrate  12.5 mg Oral BID  . risperiDONE  0.25 mg Oral BID  . sodium chloride flush  3 mL  Intravenous Q12H   Continuous Infusions: . sodium chloride Stopped (03/10/20 2325)  . cefTRIAXone (ROCEPHIN)  IV 2 g (03/11/20 2311)     LOS: 2 days    Time spent:40 min    Keyle Doby, Roselind Messier, MD [facility] Pager [phone]  If 7PM-7AM, please contact night-coverage [email or web address] Password [facility] 03/12/2020, 7:44 AM

## 2020-03-13 DIAGNOSIS — Z7189 Other specified counseling: Secondary | ICD-10-CM

## 2020-03-13 DIAGNOSIS — Z515 Encounter for palliative care: Secondary | ICD-10-CM

## 2020-03-13 DIAGNOSIS — I5023 Acute on chronic systolic (congestive) heart failure: Secondary | ICD-10-CM

## 2020-03-13 DIAGNOSIS — L03116 Cellulitis of left lower limb: Secondary | ICD-10-CM

## 2020-03-13 DIAGNOSIS — F0390 Unspecified dementia without behavioral disturbance: Secondary | ICD-10-CM

## 2020-03-13 LAB — COMPREHENSIVE METABOLIC PANEL
ALT: 11 U/L (ref 0–44)
AST: 14 U/L — ABNORMAL LOW (ref 15–41)
Albumin: 3.4 g/dL — ABNORMAL LOW (ref 3.5–5.0)
Alkaline Phosphatase: 79 U/L (ref 38–126)
Anion gap: 9 (ref 5–15)
BUN: 21 mg/dL (ref 8–23)
CO2: 30 mmol/L (ref 22–32)
Calcium: 9.1 mg/dL (ref 8.9–10.3)
Chloride: 101 mmol/L (ref 98–111)
Creatinine, Ser: 0.77 mg/dL (ref 0.44–1.00)
GFR calc Af Amer: >60 mL/min (ref >60)
GFR calc non Af Amer: >60 mL/min (ref >60)
Glucose, Bld: 96 mg/dL (ref 70–99)
Potassium: 3.7 mmol/L (ref 3.5–5.1)
Sodium: 140 mmol/L (ref 135–145)
Total Bilirubin: 0.6 mg/dL (ref 0.3–1.2)
Total Protein: 6.8 g/dL (ref 6.5–8.1)

## 2020-03-13 LAB — MAGNESIUM: Magnesium: 2 mg/dL (ref 1.7–2.4)

## 2020-03-13 LAB — CBC
HCT: 40.2 % (ref 36.0–46.0)
Hemoglobin: 13.2 g/dL (ref 12.0–15.0)
MCH: 30.1 pg (ref 26.0–34.0)
MCHC: 32.8 g/dL (ref 30.0–36.0)
MCV: 91.6 fL (ref 80.0–100.0)
Platelets: 174 10*3/uL (ref 150–400)
RBC: 4.39 MIL/uL (ref 3.87–5.11)
RDW: 13.9 % (ref 11.5–15.5)
WBC: 6.9 10*3/uL (ref 4.0–10.5)
nRBC: 0 % (ref 0.0–0.2)

## 2020-03-13 LAB — PHOSPHORUS: Phosphorus: 4 mg/dL (ref 2.5–4.6)

## 2020-03-13 NOTE — Consult Note (Signed)
Consultation Note Date: 03/13/2020   Patient Name: Cheryl Winters  DOB: 1934-10-12  MRN: [id number]  Age / Sex: 86 y.o., female  PCP: [facility] Referring Physician: Allie Bossier, MD  Reason for Consultation: Establishing goals of care  HPI/Patient Profile: 86 y.o. female  with past medical history of CHF, dementia, afib on Eliquis, hypothyroidism admitted on 03/09/2020 with shortness of breath. Found to have acute on chronic systolic CHF and bilateral lower extremity cellulitis. Receiving antibiotics and diuresis. Dementia with occasional agitation on Risperdal. Full code status. Palliative medicine consultation for goals of care.   Clinical Assessment and Goals of Care:  I have reviewed medical records, discussed with Dr. Sherral Winters in detail, and met patient at bedside. Cheryl Winters is oriented to self, otherwise disoriented with baseline dementia. She will follow simple commands. Delayed responses. Requires assist with meals. RN reports she had about  25% of breakfast. No s/s of pain or discomfort. No family at bedside.  Spoke with daughter, Cheryl Winters via telephone to discuss goals of care. Cheryl Winters reports she is documented HCPOA for her mother. There are 4 daughters, two local and two out of town.   I introduced Palliative Medicine as specialized medical care for people living with serious illness. It focuses on providing relief from the symptoms and stress of a serious illness. The goal is to improve quality of life for both the patient and the family.  We discussed a brief life review of the patient. Cheryl Winters shares that her mother's health started to decline approximately 2 years ago following a hospitalization for afib RVR. Baseline she was forgetful, but dementia started to progress at this time. Daughters eventually moved her to [place] ~2 years ago because she was requiring more care than they  could provide as they are still working. Heart failure also worsening with increased BLE edema.   Prior to this admission, she could stand and pivot with one person assist. She was not ambulating but in wheelchair throughout the day. She required assist with feeding due to tremors.   Discussed events leading up to admission and course of hospitalization including diagnoses, interventions, plan of care. Discussed chronic progressive nature of her dementia and heart failure.   I attempted to elicit values and goals of care important to the patient and family. Cheryl Winters shares that she does not believe her mother will be able to walk again and hesitant to consider SNF rehab due to her baseline. Cheryl Winters states "I want her here as long as she can be here" but also mentions she does not wish to put her mother through more than she could physically handle. Cheryl Winters reports her mother was started on [facility] hosp[facility] at ALF about 1.5 months ago in order to receive oxygen if she needed it when diagnosed with covid.  Cheryl Winters is worried about discharge and whether [facility] will accept her back knowing she may need higher level of care. It is important for daughters to avoid relocating [name] if possible. Reassured daughter that [facility] team  is following and in contact with Cheryl Winters at [facility], who plans to evaluate [name] prior to discharge.    Advanced directives, concepts specific to code status, artifical feeding and hydration, and rehospitalization were considered and discussed. Again, Cheryl Winters is documented HCPOA. Requested documentation for EMR. Cheryl Winters plans to bring to [facility] when she visits. Cheryl Winters reviews her mother's living will and shares her mother's wishes against life-prolonging intervention if terminal or incurable. Medically educated on recommendation for DNR/DNI code status with age, frailty, chronic/progressive conditions, fear of suffering at EOL with aggressive measures. Discussed concept of allowing nature to  take course with her conditions and treating the treatable. Cheryl Winters is considering DNR code status and plans to further discuss with her sisters. Cheryl Winters shares thoughts that it would be even harder to make decision to remove life support machine over allowing her mother to Cheryl Winters naturally when her time comes.  Introduced MOST form and encouraged Cheryl Winters to f/u with me tomorrow in person if possible to consider completing. PMT contact information given. Reassured of f/u tomorrow about plan of care and further decisions regarding goals/limitations to care.   Questions and concerns were addressed.  Hard Choices booklet and MOST form left at bedside for daughter to review this evening.     SUMMARY OF RECOMMENDATIONS    Continue current plan of care and medical management.   Patient has a documented living will and daughter Cheryl Winters) reports she is documented HCPOA. Requested copy of AD for EMR.  Introduced MOST form and limitations to care with underlying age, frailty, and chronic progressive conditions. Cheryl Winters considering limitations to care (DNR/DNI) and plans to further discuss with her sisters prior to making decisions.   TOC team following. May need higher level of care. [facility] staff to evaluate. PT recommending SNF.  PMT provider will f/u with daughter, Cheryl Winters tomorrow 5/28. Will complete MOST form if daughter is available to meet and ready to make decisions.   Code Status/Advance Care Planning:  Full code  Symptom Management:   Per attending  Palliative Prophylaxis:   Aspiration, Delirium Protocol, Oral Care and Turn Reposition  Psycho-social/Spiritual:   Desire for further Chaplaincy support:yes  Additional Recommendations: Caregiving  Support/Resources, Compassionate Wean Education and Education on Hospice  Prognosis:   Unable to determine  Discharge Planning: To Be Determined      Primary Diagnoses: Present on Admission: . Acute on chronic systolic CHF (congestive  heart failure), NYHA class 3 ([place]) . Dilated cardiomyopathy ([place]) . Hypertension . Acute on chronic systolic (congestive) heart failure ([place])   I have reviewed the medical record, interviewed the patient and family, and examined the patient. The following aspects are pertinent.  Past Medical History:  Diagnosis Date  . Abdominal pain, right upper quadrant   . Benign essential tremor   . Carpal tunnel syndrome   . Chronic combined systolic and diastolic CHF (congestive heart failure) ([place])    a. TTE 10/2017: EF 30-35%, diffuse HK, not technically sufficient to allow for LV diastolic function, moderate to severe MR, severely dilated left atrium measuring 54 mm, moderately dilated RV with mild systolic reduction, mildly dilated right atrium, moderate TR, PASP 55 mmHg, moderate left-sided pleural effusion  . Dementia ([place])    a. mixed vascular and Alzheimer's  . DJD (degenerative joint disease) of knee   . GERD (gastroesophageal reflux disease)   . History of stress test    a. 10/2015 showed no evidence of ischemia, EF 62%, low risk study  . HTN (hypertension)   .  Hyperlipidemia   . Persistent atrial fibrillation ([place])    a. diagnosed 10/2017; b. CHADS2VASc => 6 (CHF, HTN, age x 2, vascular disease, female); c. on Eliquis  . Syncope and collapse   . Ulnar nerve lesion   . UTI (urinary tract infection)   . Vitamin D deficiency    Social History   Socioeconomic History  . Marital status: Widowed    Spouse name: Not on file  . Number of children: 4  . Years of education: Not on file  . Highest education level: Not on file  Occupational History  . Occupation: Retired - Producer, television/film/video  Tobacco Use  . Smoking status: Never Smoker  . Smokeless tobacco: Never Used  Substance and Sexual Activity  . Alcohol use: No  . Drug use: No  . Sexual activity: Not Currently  Other Topics Concern  . Not on file  Social History Narrative   Lives alone in [place]. Widow. Has children who  live nearby.   Social Determinants of Health   Financial Resource Strain:   . Difficulty of Paying Living Expenses:   Food Insecurity:   . Worried About Charity fundraiser in the Last Year:   . Arboriculturist in the Last Year:   Transportation Needs:   . Film/video editor (Medical):   Marland Kitchen Lack of Transportation (Non-Medical):   Physical Activity:   . Days of Exercise per Week:   . Minutes of Exercise per Session:   Stress:   . Feeling of Stress :   Social Connections:   . Frequency of Communication with Friends and Family:   . Frequency of Social Gatherings with Friends and Family:   . Attends Religious Services:   . Active Member of Clubs or Organizations:   . Attends Archivist Meetings:   Marland Kitchen Marital Status:    Family History  Problem Relation Age of Onset  . Hypertension Mother   . Arthritis Mother   . Stroke Mother   . Dementia Mother   . Early death Father 24       from brain cancer  . Brain cancer Father 22  . Breast cancer Maternal Aunt   . Alcohol abuse Other    Scheduled Meds: . apixaban  5 mg Oral BID  . donepezil  5 mg Oral QHS  . escitalopram  5 mg Oral Daily  . feeding supplement (ENSURE ENLIVE)  237 mL Oral BID BM  . furosemide  40 mg Intravenous BID  . levothyroxine  75 mcg Oral QAC breakfast  . metoprolol tartrate  12.5 mg Oral BID  . risperiDONE  0.25 mg Oral BID  . sodium chloride flush  3 mL Intravenous Q12H   Continuous Infusions: . sodium chloride Stopped (03/10/20 2325)  . cefTRIAXone (ROCEPHIN)  IV 2 g (03/12/20 2109)   PRN Meds:.sodium chloride, acetaminophen, ondansetron (ZOFRAN) IV, risperiDONE, sodium chloride flush Medications Prior to Admission:  Prior to Admission medications   Medication Sig Start Date End Date Taking? Authorizing Provider  alum & mag hydroxide-simeth (MAALOX/MYLANTA) 200-200-20 MG/5ML suspension Take 30 mLs by mouth every 6 (six) hours as needed for indigestion or heartburn. 11/04/17  Yes Demetrios Loll,  MD  ammonium lactate (LAC-HYDRIN) 12 % lotion Apply 1 application topically 2 (two) times daily. (apply to the lower legs)   Yes [provider]  apixaban (ELIQUIS) 5 MG TABS tablet Take 1 tablet (5 mg total) by mouth 2 (two) times daily. 05/25/18  Yes Wellington Hampshire,  MD  Cyanocobalamin (B-12) 1000 MCG TABS Take 1,000 mcg by mouth daily.   Yes [provider]  donepezil (ARICEPT) 5 MG tablet Take 5 mg by mouth at bedtime.   Yes [provider]  escitalopram (LEXAPRO) 5 MG tablet Take 5 mg by mouth daily.   Yes [provider]  feeding supplement, ENSURE ENLIVE, (ENSURE ENLIVE) LIQD Take 237 mLs by mouth 2 (two) times daily between meals. 12/30/17  Yes Mody, Ulice Bold, MD  levothyroxine (SYNTHROID, LEVOTHROID) 75 MCG tablet Take 75 mcg by mouth daily before breakfast.    Yes [provider]  Melatonin 5 MG TABS Take 5 mg by mouth at bedtime.    Yes [provider]  metoprolol tartrate (LOPRESSOR) 25 MG tablet Take 0.5 tablets (12.5 mg total) by mouth 2 (two) times daily. 10/31/18 03/09/20 Yes Earleen Newport, MD  potassium chloride (K-DUR,KLOR-CON) 10 MEQ tablet Take 1 tablet (10 mEq total) by mouth 2 (two) times daily. 11/17/17  Yes Wellington Hampshire, MD  QUEtiapine (SEROQUEL) 25 MG tablet Take 25 mg by mouth at bedtime.   Yes [provider]  risperiDONE (RISPERDAL) 0.5 MG tablet Take 1 tablet (0.5 mg total) by mouth 2 (two) times daily as needed (aggitation). Patient taking differently: Take 0.5 mg by mouth daily as needed (agitation).  12/30/17  Yes Mody, Ulice Bold, MD  risperiDONE (RISPERDAL) 0.5 MG tablet Take 0.25 mg by mouth 2 (two) times daily.   Yes [provider]  torsemide (DEMADEX) 20 MG tablet Take 1 tablet (20 mg total) by mouth 2 (two) times daily. 05/25/18 03/09/20 Yes Wellington Hampshire, MD  neomycin-bacitracin-polymyxin (NEOSPORIN) [phone] ointment Apply 1 application topically daily.     [provider]    Allergies  Allergen Reactions  . Amlodipine Other (See Comments)    malaise malaise malaise  . Amlodipine Besylate Other (See Comments)  . Amoxicillin Other (See Comments)  . Amoxicillin-Pot Clavulanate Other (See Comments)    Other reaction(s): Other (See Comments) Cannot remember. Cannot remember.  . Penicillins Other (See Comments)    Has patient had a PCN reaction causing immediate rash, facial/tongue/throat swelling, SOB or lightheadedness with hypotension: Unknown Has patient had a PCN reaction causing severe rash involving mucus membranes or skin necrosis: Unknown Has patient had a PCN reaction that required hospitalization: Unknown Has patient had a PCN reaction occurring within the last 10 years: No If all of the above answers are "NO", then may proceed with Cephalosporin use. Cannot remember.  . Codeine Rash and Other (See Comments)  . Codeine Sulfate Rash   Review of Systems  Unable to perform ROS: Dementia   Physical Exam Vitals and nursing note reviewed.  Constitutional:      General: She is awake.     Appearance: She is ill-appearing.  HENT:     Head: Normocephalic and atraumatic.  Cardiovascular:     Heart sounds: Normal heart sounds.  Pulmonary:     Effort: No tachypnea, accessory muscle usage or respiratory distress.  Skin:    General: Skin is warm and dry.  Neurological:     Mental Status: She is alert.     Comments: Oriented to person. Forgetful with baseline dementia. Follows commands.  Psychiatric:        Attention and Perception: She is inattentive.        Cognition and Memory: Cognition is impaired.    Vital Signs: BP 112/77 (BP Location: Left Arm)   Pulse 70   Temp 97.9 F (  36.6 C)   Resp 18   Ht _0  (1.575 m)   Wt 83.6 kg   SpO2 95%   BMI 33.71 kg/m  Pain Scale: 0-10 POSS *See Group Information*: 1-Acceptable,Awake and alert Pain Score: 0-No pain   SpO2: SpO2: 95 % O2 Device:SpO2: 95 % O2 Flow Rate: .O2 Flow Rate (L/min): 2  L/min  IO: Intake/output summary:   Intake/Output Summary (Last 24 hours) at 03/13/2020 1609 Last data filed at 03/13/2020 1000 Gross per 24 hour  Intake 340 ml  Output 1100 ml  Net -760 ml    LBM:   Baseline Weight: Weight: 92.5 kg Most recent weight: Weight: 83.6 kg     Palliative Assessment/Data: PPS 40%   Flowsheet Rows     Most Recent Value  Intake Tab  Referral Department  Hospitalist  Unit at Time of Referral  Med/Surg Unit  Palliative Care Primary Diagnosis  Neurology  Palliative Care Type  New Palliative care  Reason for referral  Clarify Goals of Care  Date first seen by Palliative Care  03/13/20  Clinical Assessment  Palliative Performance Scale Score  40%  Psychosocial & Spiritual Assessment  Palliative Care Outcomes  Patient/Family meeting held?  Yes  Who was at the meeting?  daughter/POA Cheryl Winters)  [place] goals of care, Counseled regarding hospice, Provided end of life care assistance, Provided psychosocial or spiritual support, ACP counseling assistance      Time In: 1015 Time Out: 1125 Time Total: 84mn Greater than 50%  of this time was spent counseling and coordinating care related to the above assessment and plan.  Signed by:  MIhor Dow DNP, FNP-C Palliative Medicine Team  Phone: [phone]: [phone]  Please contact Palliative Medicine Team phone at [phone] questions and concerns.  For individual provider: See AShea Evans

## 2020-03-13 NOTE — Progress Notes (Signed)
PROGRESS NOTE    Cheryl Winters  EGB:[id number] DOB: March 30, 1934 DOA: 03/09/2020 PCP: Orvis Brill, [facility]     Brief Narrative:  86 yo WF.  From ALF, PMHx acute on chronic systolic CHF, A. fib on Eliquis, dementia, hypothyroidism.    Presented with complaints of shortness of breath.  Found to have acute on chronic systolic CHF as well as cellulitis of bilateral lower extremity. Currently further plan is continue diuresis.   Subjective: 5/27 A/O x1, breath (does not know where, when, why) moves bilateral upper extremities to commands.  Will not move bilateral lower extremities.  Nonverbal today   Assessment & Plan:   Principal Problem:   Acute on chronic systolic CHF (congestive heart failure), NYHA class 3 (HCC) Active Problems:   Hypertension   Atrial fibrillation, chronic (HCC)   Dilated cardiomyopathy (HCC)   Dementia without behavioral disturbance (HCC)   Cellulitis of left lower leg   Chronic ulcer of left leg with fat layer exposed ([place])   Hypoxia   Acute on chronic systolic (congestive) heart failure (HCC)   CHF (congestive heart failure) (HCC)   Acute on chronic systolic CHF. -Strict in and out -7.4 L -Daily weight Filed Weights   03/11/20 0504 03/12/20 0600 03/13/20 0304  Weight: 85.2 kg 85.2 kg 83.6 kg  -Furosemide IV 40 mg BID -Echocardiogram actually shows 50 to 60% EF without any new wall motion abnormality but does show grade 1 diastolic dysfunction.  As well as decreased RV function. -Apixaban 5 mg BID -Metoprolol 12.5 mg BID  Chronic diastolic CHF -See CHF  Chronic atrial fibrillation -See CHF -Currently NSR  Essential HTN -See CHF  Acute hypoxic respiratory failure -Currently on room air  Cellulitis of lower extremity -Currently on IV antibiotics. -Transition to oral tomorrow. -Lower extremity Doppler negative for DVT see results below  Dementia with occasional agitation -Unsure of patient at baseline currently A/O x1 (does not  know where, when, why) -Donepezil 5 mg daily -Lexapro 5 mg daily -Risperidone 0.25 mg BID -Risperidone 0.5 mg PRN  Hypothyroidism Continue Synthroid.  Obesity Body mass index is 34.37 kg/m.  Hypokalemia -Potassium goal 4 -K-Dur 40 mEq  Goals of care -5/27 PT/OT consult;Evaluate patient for SNF.  Came from ALF doubt seriously could now survive an ALF.      DVT prophylaxis: Apixaban Code Status: Full Family Communication:  Disposition Plan:  Status is: Inpatient    Dispo: The patient is from: Assisted living facility              Anticipated d/c is to: SNF?              Anticipated d/c date is: 5/28              Patient currently stable      Consultants:  Palliative care  Procedures/Significant Events:    I have personally reviewed and interpreted all radiology studies and my findings are as above.  VENTILATOR SETTINGS:    Cultures   Antimicrobials: Anti-infectives (From admission, onward)   Start     Dose/Rate Stop   03/11/20 2200  cefTRIAXone (ROCEPHIN) 2 g in sodium chloride 0.9 % 100 mL IVPB     2 g 200 mL/hr over 30 Minutes     03/10/20 2300  vancomycin (VANCOREADY) IVPB 1250 mg/250 mL  Status:  Discontinued     1,250 mg 166.7 mL/hr over 90 Minutes 03/10/20 1327   03/10/20 2200  cefTRIAXone (ROCEPHIN) 1 g in sodium chloride 0.9 % 100  mL IVPB  Status:  Discontinued     1 g 200 mL/hr over 30 Minutes 03/11/20 1036   03/09/20 2230  vancomycin (VANCOCIN) IVPB 1000 mg/200 mL premix     1,000 mg 200 mL/hr over 60 Minutes 03/10/20 0241   03/09/20 2030  cefTRIAXone (ROCEPHIN) 1 g in sodium chloride 0.9 % 100 mL IVPB     1 g 200 mL/hr over 30 Minutes 03/09/20 2149   03/09/20 2030  vancomycin (VANCOCIN) IVPB 1000 mg/200 mL premix     1,000 mg 200 mL/hr over 60 Minutes 03/09/20 2318       Devices    LINES / TUBES:      Continuous Infusions: . sodium chloride Stopped (03/10/20 2325)  . cefTRIAXone (ROCEPHIN)  IV 2 g (03/12/20 2109)      Objective: Vitals:   03/12/20 1640 03/12/20 2016 03/13/20 0304 03/13/20 0820  BP: 112/81 (!) 120/57 129/85 (!) 154/91  Pulse: (!) 55 74 78 81  Resp: 17 18 (!) 21 17  Temp: 97.9 F (36.6 C) 97.6 F (36.4 C) 98.7 F (37.1 C) 97.9 F (36.6 C)  TempSrc:      SpO2: 95% 94% 94% 90%  Weight:   83.6 kg   Height:        Intake/Output Summary (Last 24 hours) at 03/13/2020 0926 Last data filed at 03/13/2020 0901 Gross per 24 hour  Intake 103 ml  Output 1650 ml  Net -1547 ml   Filed Weights   03/11/20 0504 03/12/20 0600 03/13/20 0304  Weight: 85.2 kg 85.2 kg 83.6 kg    Examination:  General: A/O x1 (does not know where, when, why), does not respond to all commands.  No acute respiratory distress Eyes: negative scleral hemorrhage, negative anisocoria, negative icterus ENT: Negative Runny nose, negative gingival bleeding, Neck:  Negative scars, masses, torticollis, lymphadenopathy, JVD Lungs: Clear to auscultation bilaterally without wheezes or crackles Cardiovascular: Regular rate and rhythm without murmur gallop or rub normal S1 and S2 Abdomen: OBESE, negative abdominal pain, nondistended, positive soft, bowel sounds, no rebound, no ascites, no appreciable mass Extremities: No significant cyanosis, clubbing, or edema bilateral lower extremities Skin: Negative rashes, lesions, ulcers Psychiatric:  Negative depression, negative anxiety, negative fatigue, negative mania  Central nervous system:  Cranial nerves II through XII intact, tongue/uvula midline, all upper extremities muscle strength 4 /5, sensation intact throughout, bilateral lower extremity does not respond to painful stimuli, nonverbal today.  .     Data Reviewed: Care during the described time interval was provided by me .  I have reviewed this patient's available data, including medical history, events of note, physical examination, and all test results as part of my evaluation.  CBC: Recent Labs  Lab  03/09/20 1909 03/13/20 0555  WBC 5.5 6.9  NEUTROABS 2.8  --   HGB 12.4 13.2  HCT 37.4 40.2  MCV 91.2 91.6  PLT 185 174   Basic Metabolic Panel: Recent Labs  Lab 03/09/20 1909 03/10/20 0520 03/11/20 0347 03/12/20 0508 03/13/20 0555  NA 141 140 140 139 140  K 4.2 3.6 3.3* 3.4* 3.7  CL 107 106 103 101 101  CO2 26 26 28 29 30   GLUCOSE 89 87 82 89 96  BUN 18 17 17 16 21   CREATININE 0.68 0.56 0.71 0.69 0.77  CALCIUM 9.0 8.4* 8.7* 9.0 9.1  MG  --   --  1.8 1.9 2.0  PHOS  --   --   --   --  4.0  GFR: Estimated Creatinine Clearance: 52.5 mL/min (by C-G formula based on SCr of 0.77 mg/dL). Liver Function Tests: Recent Labs  Lab 03/13/20 0555  AST 14*  ALT 11  ALKPHOS 79  BILITOT 0.6  PROT 6.8  ALBUMIN 3.4*   No results for input(s): LIPASE, AMYLASE in the last 168 hours. No results for input(s): AMMONIA in the last 168 hours. Coagulation Profile: No results for input(s): INR, PROTIME in the last 168 hours. Cardiac Enzymes: No results for input(s): CKTOTAL, CKMB, CKMBINDEX, TROPONINI in the last 168 hours. BNP (last 3 results) No results for input(s): PROBNP in the last 8760 hours. HbA1C: No results for input(s): HGBA1C in the last 72 hours. CBG: No results for input(s): GLUCAP in the last 168 hours. Lipid Profile: No results for input(s): CHOL, HDL, LDLCALC, TRIG, CHOLHDL, LDLDIRECT in the last 72 hours. Thyroid Function Tests: No results for input(s): TSH, T4TOTAL, FREET4, T3FREE, THYROIDAB in the last 72 hours. Anemia Panel: No results for input(s): VITAMINB12, FOLATE, FERRITIN, TIBC, IRON, RETICCTPCT in the last 72 hours. Sepsis Labs: No results for input(s): PROCALCITON, LATICACIDVEN in the last 168 hours.  Recent Results (from the past 240 hour(s))  SARS CORONAVIRUS 2 (TAT 6-24 HRS) Nasopharyngeal Nasopharyngeal Swab     Status: None   Collection Time: 03/09/20  9:16 PM   Specimen: Nasopharyngeal Swab  Result Value Ref Range Status   SARS Coronavirus 2  NEGATIVE NEGATIVE Final    Comment: (NOTE) SARS-CoV-2 target nucleic acids are NOT DETECTED. The SARS-CoV-2 RNA is generally detectable in upper and lower respiratory specimens during the acute phase of infection. Negative results do not preclude SARS-CoV-2 infection, do not rule out co-infections with other pathogens, and should not be used as the sole basis for treatment or other patient management decisions. Negative results must be combined with clinical observations, patient history, and epidemiological information. The expected result is Negative. Fact Sheet for Patients: HairSlick.no Fact Sheet for Healthcare Providers: quierodirigir.com This test is not yet approved or cleared by the [place] FDA and  has been authorized for detection and/or diagnosis of SARS-CoV-2 by FDA under an Emergency Use Authorization (EUA). This EUA will remain  in effect (meaning this test can be used) for the duration of the COVID-19 declaration under Section 56 4(b)(1) of the Act, 21 U.S.C. section 360bbb-3(b)(1), unless the authorization is terminated or revoked sooner. Performed at [facility], [address] [place], [place] [address]   MRSA PCR Screening     Status: None   Collection Time: 03/11/20  6:22 AM   Specimen: Nasopharyngeal  Result Value Ref Range Status   MRSA by PCR NEGATIVE NEGATIVE Final    Comment:        The GeneXpert MRSA Assay (FDA approved for NASAL specimens only), is one component of a comprehensive MRSA colonization surveillance program. It is not intended to diagnose MRSA infection nor to guide or monitor treatment for MRSA infections. Performed at [facility], [address], [place], [place] [address]          Radiology Studies: No results found.      Scheduled Meds: . apixaban  5 mg Oral BID  . donepezil  5 mg Oral QHS  . escitalopram  5 mg Oral Daily  . feeding  supplement (ENSURE ENLIVE)  237 mL Oral BID BM  . furosemide  40 mg Intravenous BID  . levothyroxine  75 mcg Oral QAC breakfast  . metoprolol tartrate  12.5 mg Oral BID  .  risperiDONE  0.25 mg Oral BID  . sodium chloride flush  3 mL Intravenous Q12H   Continuous Infusions: . sodium chloride Stopped (03/10/20 2325)  . cefTRIAXone (ROCEPHIN)  IV 2 g (03/12/20 2109)     LOS: 3 days    Time spent:40 min    Gevorg Brum, Roselind Messier, MD [facility] Pager [phone]  If 7PM-7AM, please contact night-coverage [email or web address] Password [facility] 03/13/2020, 9:26 AM

## 2020-03-13 NOTE — Progress Notes (Signed)
Physical Therapy Treatment Patient Details Name: Cheryl Winters MRN: [id number] DOB: 08-17-1934 Today's Date: 03/13/2020    History of Present Illness Per MD notes: Pt is an 86 y.o. female with medical history significant for CHF, A. fib on Eliquis, dementia, hypothyroidism who was sent to the emergency room for evaluation of shortness of breath.  MD assessment includes: Acute on chronic systolic CHF, hypoxia, LLE cellulitis, chronic LLE ulcer, HTN, A-fib, and dementia.    PT Comments    Pt awake and alert but continued to require extensive multi-modal cues to follow commands.  Pt required extensive physical assistance with bed mobility tasks and was unable to come to full upright standing even with heavy +2 assist.  Pt limited by tendency to present with posterior lean in sitting and standing with static sitting balance improving somewhat during the session after anterior weight shifting therapeutic exercises.  Pt will benefit from a trial of PT services in a SNF setting upon discharge to safely address deficits listed in patient problem list for decreased caregiver assistance and eventual return to PLOF.    Follow Up Recommendations  SNF     Equipment Recommendations  None recommended by PT    Recommendations for Other Services       Precautions / Restrictions Precautions Precautions: Fall Restrictions Weight Bearing Restrictions: No Other Position/Activity Restrictions: Keep O2 >92%    Mobility  Bed Mobility Overal bed mobility: Needs Assistance Bed Mobility: Supine to Sit;Sit to Supine     Supine to sit: +2 for physical assistance;Max assist Sit to supine: +2 for physical assistance;Max assist   General bed mobility comments: Max A for BLE and trunk control  Transfers Overall transfer level: Needs assistance Equipment used: Rolling walker (2 wheeled) Transfers: Sit to/from Stand Sit to Stand: Mod assist;+2 physical assistance         General transfer comment:  Pt unable to come to full upright sitting with heavy posterior lean during standing attempts  Ambulation/Gait             General Gait Details: Unable   Stairs             Wheelchair Mobility    Modified Rankin (Stroke Patients Only)       Balance Overall balance assessment: Needs assistance   Sitting balance-Leahy Scale: Poor Sitting balance - Comments: Posterior instability in sitting that improved during the session after anterior weight shifting activities     Standing balance-Leahy Scale: Poor Standing balance comment: +2 Mod A to prevent posterior LOB                            Cognition Arousal/Alertness: Awake/alert Behavior During Therapy: Flat affect Overall Cognitive Status: No family/caregiver present to determine baseline cognitive functioning                                 General Comments: Pt unable to provide reliable history but was able to follow 1-step commands 50-75% of the time with extra cuing and time to process      Exercises Total Joint Exercises Ankle Circles/Pumps: AAROM;PROM;Both;5 reps;10 reps Hip ABduction/ADduction: AAROM;Both;5 reps;10 reps Straight Leg Raises: AAROM;Both;5 reps;10 reps Long Arc Quad: AAROM;Both;10 reps;5 reps Other Exercises Other Exercises: Anterior weight shifting in sitting    General Comments        Pertinent Vitals/Pain Pain Assessment: No/denies pain    Home  Living                      Prior Function            PT Goals (current goals can now be found in the care plan section) Progress towards PT goals: Not progressing toward goals - comment(Pt limited by cognition and heavy posterior lean in standing)    Frequency    Min 2X/week      PT Plan Current plan remains appropriate    Co-evaluation              AM-PAC PT "6 Clicks" Mobility   Outcome Measure  Help needed turning from your back to your side while in a flat bed without using  bedrails?: A Lot Help needed moving from lying on your back to sitting on the side of a flat bed without using bedrails?: A Lot Help needed moving to and from a bed to a chair (including a wheelchair)?: A Lot Help needed standing up from a chair using your arms (e.g., wheelchair or bedside chair)?: A Lot Help needed to walk in hospital room?: Total Help needed climbing 3-5 steps with a railing? : Total 6 Click Score: 10    End of Session Equipment Utilized During Treatment: Gait belt Activity Tolerance: Patient tolerated treatment well Patient left: in bed;with call bell/phone within reach;with bed alarm set Nurse Communication: Mobility status PT Visit Diagnosis: Unsteadiness on feet (R26.81);Muscle weakness (generalized) (M62.81);Difficulty in walking, not elsewhere classified (R26.2)     Time: 9417-4081 PT Time Calculation (min) (ACUTE ONLY): 24 min  Charges:  $Therapeutic Exercise: 8-22 mins $Therapeutic Activity: 8-22 mins                     D. Scott Markiah Janeway PT, DPT 03/13/20, 3:41 PM

## 2020-03-13 NOTE — Progress Notes (Addendum)
OT Cancellation Note  Patient Details Name: Cheryl Winters MRN: [id number] DOB: Sep 16, 1934   Cancelled Treatment:    Reason Eval/Treat Not Completed: Other (comment). Consult received, chart reviewed. Upon attempt, pt with nurse tech. Will re-attempt formal OT evaluation at later time.   Addendum, 3:55pm: Pt significantly fatigued. Noted pt recently worked with PT. Unable to meaningfully participate in OT evaluation at this time. Will re-attempt next date as medically appropriate.  Richrd Prime, MPH, MS, OTR/L ascom [phone] 03/13/20, 3:55 PM

## 2020-03-13 NOTE — TOC Progression Note (Signed)
Transition of Care [facility]) - Progression Note    Patient Details  Name: Cheryl Winters MRN: [id number] Date of Birth: [birth year]  [facility] CM/SW Contact  Shawn Route, RN Phone Number: 03/13/2020, 10:12 AM  Clinical Narrative:    Left message with Tresa Endo at [facility], she would like to come do an assessment of patient before accepting her back to facility.  Asked her to return call with time she can make it for assessment.    Expected Discharge Plan: Skilled Nursing Facility Barriers to Discharge: Continued Medical Work up  Expected Discharge Plan and Services Expected Discharge Plan: Skilled Nursing Facility   Discharge Planning Services: CM Consult   Living arrangements for the past 2 months: Assisted Living Facility                                       Social Determinants of Health (SDOH) Interventions    Readmission Risk Interventions No flowsheet data found.

## 2020-03-13 NOTE — Care Management Important Message (Signed)
Important Message  Patient Details  Name: Cheryl Winters MRN: [id number] Date of Birth: Nov 27, 1933   Medicare Important Message Given:  Yes     Johnell Comings 03/13/2020, 3:26 PM

## 2020-03-14 DIAGNOSIS — F0150 Vascular dementia without behavioral disturbance: Secondary | ICD-10-CM

## 2020-03-14 DIAGNOSIS — I482 Chronic atrial fibrillation, unspecified: Secondary | ICD-10-CM

## 2020-03-14 DIAGNOSIS — I420 Dilated cardiomyopathy: Secondary | ICD-10-CM

## 2020-03-14 LAB — COMPREHENSIVE METABOLIC PANEL
ALT: 10 U/L (ref 0–44)
AST: 16 U/L (ref 15–41)
Albumin: 3.3 g/dL — ABNORMAL LOW (ref 3.5–5.0)
Alkaline Phosphatase: 84 U/L (ref 38–126)
Anion gap: 10 (ref 5–15)
BUN: 20 mg/dL (ref 8–23)
CO2: 30 mmol/L (ref 22–32)
Calcium: 8.9 mg/dL (ref 8.9–10.3)
Chloride: 101 mmol/L (ref 98–111)
Creatinine, Ser: 0.62 mg/dL (ref 0.44–1.00)
GFR calc Af Amer: >60 mL/min (ref >60)
GFR calc non Af Amer: >60 mL/min (ref >60)
Glucose, Bld: 88 mg/dL (ref 70–99)
Potassium: 3.4 mmol/L — ABNORMAL LOW (ref 3.5–5.1)
Sodium: 141 mmol/L (ref 135–145)
Total Bilirubin: 0.8 mg/dL (ref 0.3–1.2)
Total Protein: 6.7 g/dL (ref 6.5–8.1)

## 2020-03-14 LAB — CBC
HCT: 41.2 % (ref 36.0–46.0)
Hemoglobin: 13.5 g/dL (ref 12.0–15.0)
MCH: 30.1 pg (ref 26.0–34.0)
MCHC: 32.8 g/dL (ref 30.0–36.0)
MCV: 91.8 fL (ref 80.0–100.0)
Platelets: 175 10*3/uL (ref 150–400)
RBC: 4.49 MIL/uL (ref 3.87–5.11)
RDW: 13.8 % (ref 11.5–15.5)
WBC: 5.7 10*3/uL (ref 4.0–10.5)
nRBC: 0 % (ref 0.0–0.2)

## 2020-03-14 LAB — MAGNESIUM: Magnesium: 2 mg/dL (ref 1.7–2.4)

## 2020-03-14 LAB — PHOSPHORUS: Phosphorus: 3.5 mg/dL (ref 2.5–4.6)

## 2020-03-14 MED ORDER — ONDANSETRON HCL 4 MG/2ML IJ SOLN: 4.0000 mg | Freq: Four times a day (QID) | INTRAMUSCULAR | 0 refills | Status: AC | PRN

## 2020-03-14 MED ORDER — DOXYCYCLINE HYCLATE 100 MG PO TABS: 100.0000 mg | ORAL_TABLET | Freq: Two times a day (BID) | ORAL | 0 refills | Status: AC

## 2020-03-14 MED ORDER — POTASSIUM CHLORIDE CRYS ER 20 MEQ PO TBCR
40.0000 meq | EXTENDED_RELEASE_TABLET | Freq: Two times a day (BID) | ORAL | Status: AC
  Administered 2020-03-14 – 2020-03-15 (×2): 40 meq via ORAL
  Filled 2020-03-14 (×2): qty 2

## 2020-03-14 MED ORDER — METOPROLOL TARTRATE 25 MG PO TABS: 12.5000 mg | ORAL_TABLET | Freq: Two times a day (BID) | ORAL | 0 refills | Status: AC

## 2020-03-14 MED ORDER — ACETAMINOPHEN 325 MG PO TABS: 650.0000 mg | ORAL_TABLET | ORAL | 0 refills | Status: AC | PRN

## 2020-03-14 MED ORDER — FUROSEMIDE 10 MG/ML IJ SOLN: 40.0000 mg | Freq: Two times a day (BID) | INTRAMUSCULAR | 0 refills | Status: AC

## 2020-03-14 MED ORDER — DOXYCYCLINE HYCLATE 100 MG PO TABS
100.0000 mg | ORAL_TABLET | Freq: Two times a day (BID) | ORAL | Status: DC
  Administered 2020-03-14 – 2020-03-15 (×3): 100 mg via ORAL
  Filled 2020-03-14 (×3): qty 1

## 2020-03-14 NOTE — Evaluation (Signed)
Occupational Therapy Evaluation Patient Details Name: Cheryl Winters MRN: [id number] DOB: 1933-11-11 Today's Date: 03/14/2020    History of Present Illness Per MD notes: Pt is an 86 y.o. female with medical history significant for CHF, A. fib on Eliquis, dementia, hypothyroidism who was sent to the emergency room for evaluation of shortness of breath.  MD assessment includes: Acute on chronic systolic CHF, hypoxia, LLE cellulitis, chronic LLE ulcer, HTN, A-fib, and dementia.   Clinical Impression   Pt was seen for OT evaluation this date. Prior to hospital admission, pt was living at an ALF requiring increasing assist for ADL/mobility. Per chart, pt most recently using w/c and requiring assist for self feeding. Pt is poor historian and no family present. Currently pt demonstrates impairments as described below (See OT problem list) which functionally limit her ability to perform ADL/self-care tasks. Pt currently requires mod assist for grooming with cues to initiate and max assist for self feeding, bathing, dressing, and toileting from bed level. +2 assist for all mobility attempts.  Pt would benefit from skilled OT to address noted impairments and functional limitations (see below for any additional details) in order to maximize safety and independence while minimizing falls risk and caregiver burden. Upon hospital discharge, recommend trial of STR to maximize pt safety and return to PLOF.     Follow Up Recommendations  SNF    Equipment Recommendations  3 in 1 bedside commode    Recommendations for Other Services       Precautions / Restrictions Precautions Precautions: Fall Restrictions Weight Bearing Restrictions: No Other Position/Activity Restrictions: Keep O2 >92%      Mobility Bed Mobility Overal bed mobility: Needs Assistance             General bed mobility comments: Max assist +2 for all bed mobility attempts  Transfers                      Balance  Overall balance assessment: Needs assistance   Sitting balance-Leahy Scale: Poor                                     ADL either performed or assessed with clinical judgement   ADL Overall ADL's : Needs assistance/impaired                                       General ADL Comments: Pt requires max assist for self feeding, mod assist for grooming tasks with cues for initiating task, Max assist for bathing, dressing, and bed level toileting     Vision         Perception     Praxis      Pertinent Vitals/Pain Pain Assessment: No/denies pain     Hand Dominance     Extremity/Trunk Assessment Upper Extremity Assessment Upper Extremity Assessment: Generalized weakness   Lower Extremity Assessment Lower Extremity Assessment: Generalized weakness       Communication Communication Communication: No difficulties   Cognition Arousal/Alertness: Awake/alert Behavior During Therapy: Flat affect Overall Cognitive Status: No family/caregiver present to determine baseline cognitive functioning                                 General Comments: pt oriented to self, max multimodal cues  for 1 step commands, no family present   General Comments       Exercises     Shoulder Instructions      Home Living Family/patient expects to be discharged to:: Assisted living                             Home Equipment: Walker - 2 wheels   Additional Comments: Pt owns a RW per chart review and verified by pt      Prior Functioning/Environment Level of Independence: Needs assistance  Gait / Transfers Assistance Needed: per chart, primarily w/c bound ADL's / Homemaking Assistance Needed: required assist for self feeding            OT Problem List: Decreased strength;Decreased coordination;Decreased cognition;Decreased safety awareness;Decreased activity tolerance;Impaired balance (sitting and/or standing);Decreased knowledge of  use of DME or AE;Impaired UE functional use      OT Treatment/Interventions: Self-care/ADL training;Therapeutic exercise;Therapeutic activities;DME and/or AE instruction;Patient/family education;Balance training    OT Goals(Current goals can be found in the care plan section) Acute Rehab OT Goals Patient Stated Goal: pt did not state OT Goal Formulation: Patient unable to participate in goal setting Time For Goal Achievement: 03/28/20 Potential to Achieve Goals: Good ADL Goals Pt Will Perform Grooming: with min assist;bed level Additional ADL Goal #1: Pt will perform bed mobility with max assist to facilitate bed level ADL tasks and repositioning for skin breakdown prevention.  OT Frequency: Min 1X/week   Barriers to D/C:            Co-evaluation              AM-PAC OT "6 Clicks" Daily Activity     Outcome Measure Help from another person eating meals?: A Lot Help from another person taking care of personal grooming?: A Lot Help from another person toileting, which includes using toliet, bedpan, or urinal?: A Lot Help from another person bathing (including washing, rinsing, drying)?: A Lot Help from another person to put on and taking off regular upper body clothing?: A Lot Help from another person to put on and taking off regular lower body clothing?: A Lot 6 Click Score: 12   End of Session    Activity Tolerance: Patient tolerated treatment well Patient left: in bed;with call bell/phone within reach;with bed alarm set  OT Visit Diagnosis: Other abnormalities of gait and mobility (R26.89);Muscle weakness (generalized) (M62.81);Other symptoms and signs involving cognitive function                Time: 1025-1036 OT Time Calculation (min): 11 min Charges:  OT General Charges $OT Visit: 1 Visit OT Evaluation $OT Eval Moderate Complexity: 1 Mod  Richrd Prime, MPH, MS, OTR/L ascom [phone] 03/14/20, 11:39 AM

## 2020-03-14 NOTE — Progress Notes (Signed)
Daily Progress Note   Patient Name: Cheryl Winters       Date: 03/14/2020 DOB: 1934-05-11  Age: 86 y.o. MRN#: [id number] Attending Physician: Drema Dallas, MD Primary Care Physician: [facility] Admit Date: 03/09/2020  Reason for Consultation/Follow-up: Establishing goals of care  Subjective: Patient wakes to voice. Pleasantly confused with baseline dementia. Oriented to self. No s/s of pain or discomfort. Resting comfortably.   GOC:  Discussed in detail with Dr. Joseph Art and RN.   No family at bedside. Unable to get in touch with daughter, Asher Muir this afternoon.    Length of Stay: 4  Current Medications: Scheduled Meds:  . apixaban  5 mg Oral BID  . donepezil  5 mg Oral QHS  . escitalopram  5 mg Oral Daily  . feeding supplement (ENSURE ENLIVE)  237 mL Oral BID BM  . furosemide  40 mg Intravenous BID  . levothyroxine  75 mcg Oral QAC breakfast  . metoprolol tartrate  12.5 mg Oral BID  . risperiDONE  0.25 mg Oral BID  . sodium chloride flush  3 mL Intravenous Q12H    Continuous Infusions: . sodium chloride Stopped (03/10/20 2325)  . cefTRIAXone (ROCEPHIN)  IV 2 g (03/13/20 2253)    PRN Meds: sodium chloride, acetaminophen, ondansetron (ZOFRAN) IV, risperiDONE, sodium chloride flush  Physical Exam Vitals and nursing note reviewed.  Pulmonary:     Effort: No tachypnea, accessory muscle usage or respiratory distress.  Abdominal:     Tenderness: There is no abdominal tenderness.  Neurological:     Mental Status: She is easily aroused.     Comments: Oriented to self, otherwise disoriented with baseline dementia             Vital Signs: BP (!) 143/90 (BP Location: Left Arm)   Pulse 79   Temp 97.7 F (36.5 C)   Resp 17   Ht 5\' 2"  (1.575 m)   Wt 82.3  kg   SpO2 94%   BMI 33.20 kg/m  SpO2: SpO2: 94 % O2 Device: O2 Device: Room Air O2 Flow Rate: O2 Flow Rate (L/min): 2 L/min  Intake/output summary:   Intake/Output Summary (Last 24 hours) at 03/14/2020 1048 Last data filed at 03/14/2020 1022 Gross per 24 hour  Intake 420 ml  Output 2050 ml  Net -1630  ml   LBM:   Baseline Weight: Weight: 92.5 kg Most recent weight: Weight: 82.3 kg       Palliative Assessment/Data: PPS 40%   Flowsheet Rows     Most Recent Value  Intake Tab  Referral Department  Hospitalist  Unit at Time of Referral  Med/Surg Unit  Palliative Care Primary Diagnosis  Neurology  Palliative Care Type  New Palliative care  Reason for referral  Clarify Goals of Care  Date first seen by Palliative Care  03/13/20  Clinical Assessment  Palliative Performance Scale Score  40%  Psychosocial & Spiritual Assessment  Palliative Care Outcomes  Patient/Family meeting held?  Yes  Who was at the meeting?  daughter/POA Roselyn Reef)  [place] goals of care, Counseled regarding hospice, Provided end of life care assistance, Provided psychosocial or spiritual support, ACP counseling assistance      Patient Active Problem List   Diagnosis Date Noted  . Palliative care by specialist   . CHF (congestive heart failure) ([place]) 03/10/2020  . Cellulitis of left lower extremity 03/09/2020  . Chronic ulcer of left leg with fat layer exposed ([place]) 03/09/2020  . Hypoxia 03/09/2020  . Acute on chronic systolic (congestive) heart failure ([place]) 03/09/2020  . Acute on chronic systolic CHF (congestive heart failure), NYHA class 3 ([place]) 12/28/2017  . Somnolence 12/28/2017  . Orthostatic hypotension 11/11/2017  . Dilated cardiomyopathy ([place]) 11/03/2017  . Dementia without behavioral disturbance ([place]) 11/03/2017  . Pleural effusion 11/02/2017  . Atrial fibrillation, chronic ([place]) 11/01/2017  . Atrial fibrillation with RVR ([place]) 11/01/2017  . Memory change  06/27/2016  . Acute cystitis without hematuria 11/03/2015  . Lightheadedness 10/31/2015  . Bradycardia 09/19/2014  . Goals of care, counseling/discussion 09/02/2014  . Medicare annual wellness visit, subsequent 08/21/2012  . Tremor, essential 08/21/2012  . Hypertension 11/22/2011  . Hyperlipidemia 11/22/2011    Palliative Care Assessment & Plan   Patient Profile: 86 y.o. female  with past medical history of CHF, dementia, afib on Eliquis, hypothyroidism admitted on 03/09/2020 with shortness of breath. Found to have acute on chronic systolic CHF and bilateral lower extremity cellulitis. Receiving antibiotics and diuresis. Dementia with occasional agitation on Risperdal. Full code status. Palliative medicine consultation for goals of care.   Assessment: Acute on chronic systolic CHF Chronic diastolic CHF Dementia with occasional agitation Chronic atrial fibrillation Acute hypoxic respiratory failure Cellulitis of bilateral lower extremities Weakness  Recommendations/Plan: Continue current plan of care and medical management.  Continue PT/OT efforts. Pending evaluation from [facility] staff regarding discharge. May need higher level of care.  Patient has a documented living will. Requested copy from daughter.  Unable to reach daughter this afternoon for f/u [place] discussion. Daughter was considering limitations to care and wanted to speak with her sisters before making decisions.  May benefit from outpatient palliative f/u for further GOC/code status discussions. Patient would benefit from MOST form completion.   PMT provider off service at [facility] until Monday 5/31 but will f/u next week if patient is still hospitalized.   Code Status: FULL    Code Status Orders  (From admission, onward)         Start     Ordered   03/09/20 2047  Full code  Continuous     03/09/20 2048        Code Status History    Date Active Date Inactive Code Status Order ID Comments User Context    12/28/2017 1815 12/30/2017 1655 Full Code [id number]  Alford Highland, MD Inpatient   11/01/2017 1720 11/04/2017 1716 Full Code [id number]  Shaune Pollack, MD Inpatient   Advance Care Planning Activity    Advance Directive Documentation     Most Recent Value  Type of Advance Directive  Healthcare Power of Attorney  Pre-existing out of facility DNR order (yellow form or pink MOST form)  --  "MOST" Form in Place?  --      Prognosis:  Unable to determine  Discharge Planning: To Be Determined  Care plan was discussed with RN, Dr. Joseph Art,   Thank you for allowing the Palliative Medicine Team to assist in the care of this patient.   Time In: 1335 Time Out: 1350 Total Time 15 Prolonged Time Billed        Greater than 50%  of this time was spent counseling and coordinating care related to the above assessment and plan.  Vennie Homans, DNP, FNP-C Palliative Medicine Team  Phone: [phone] Fax: [phone]  Please contact Palliative Medicine Team phone at [phone] for questions and concerns.

## 2020-03-14 NOTE — Discharge Summary (Signed)
Physician Discharge Summary  Cheryl Winters ZOX:[id number] DOB: May 07, 1934 DOA: 03/09/2020  PCP: Almetta Lovely, [facility]  Admit date: 03/09/2020 Discharge date: 03/14/2020  Time spent: 30 minutes  Recommendations for Outpatient Follow-up:   Acute on chronic systolic CHF. -Strict in and out -9.1 L -Daily weight Filed Weights   03/12/20 0600 03/13/20 0304 03/14/20 0537  Weight: 85.2 kg 83.6 kg 82.3 kg  -Furosemide IV 40 mg BID -Echocardiogram actually shows 50 to 60% EF without any new wall motion abnormality but does show grade 1 diastolic dysfunction. As well as decreased RV function. -Apixaban 5 mg BID -Metoprolol 12.5 mg BID  Chronic diastolic CHF -See CHF  Chronic atrial fibrillation -See CHF -Currently NSR  Essential HTN -See CHF  Acute hypoxic respiratory failure -Currently on room air  Cellulitis of lower extremity -Currently on IV antibiotics. -Transition to oral tomorrow. -We will complete 7-day course of antibiotics -Lower extremity Doppler negative for DVT see results below  Dementia with occasional agitation -Unsure of patient at baseline currently A/O x1 (does not know where, when, why) -Donepezil 5 mg daily -Lexapro 5 mg daily -Risperidone 0.25 mg BID -Risperidone 0.5 mg PRN  Hypothyroidism Continue Synthroid.  Obesity Body mass index is 34.37 kg/m.  Hypokalemia -Potassium goal 4 -K-Dur 40 meq x2 doses   Discharge Diagnoses:  Principal Problem:   Acute on chronic systolic CHF (congestive heart failure), NYHA class 3 (HCC) Active Problems:   Hypertension   Goals of care, counseling/discussion   Atrial fibrillation, chronic (HCC)   Dilated cardiomyopathy (HCC)   Dementia without behavioral disturbance (HCC)   Cellulitis of left lower extremity   Chronic ulcer of left leg with fat layer exposed (HCC)   Hypoxia   Acute on chronic systolic (congestive) heart failure (HCC)   CHF (congestive heart failure) (HCC)   Palliative  care by specialist   Discharge Condition: Guarded  Diet recommendation: Heart healthy  Filed Weights   03/12/20 0600 03/13/20 0304 03/14/20 0537  Weight: 85.2 kg 83.6 kg 82.3 kg    History of present illness:  86 yo WF.  From ALF, PMHx acute on chronic systolic CHF, A. fib on Eliquis, dementia, hypothyroidism.   Presented with complaints of shortness of breath. Found to have acute on chronic systolic CHF as well as cellulitis of bilateral lower extremity. Currently further plan iscontinue diuresis.  Hospital Course:  See above    Consultations: Palliative care   Cultures  5/23 SARS coronavirus negative 5/25 MRSA by PCR negative   Antibiotics Anti-infectives (From admission, onward)   Start     Dose/Rate Stop   03/14/20 1400  doxycycline (VIBRA-TABS) tablet 100 mg     100 mg 03/19/20 0959   03/11/20 2200  cefTRIAXone (ROCEPHIN) 2 g in sodium chloride 0.9 % 100 mL IVPB  Status:  Discontinued     2 g 200 mL/hr over 30 Minutes 03/14/20 1346   03/10/20 2300  vancomycin (VANCOREADY) IVPB 1250 mg/250 mL  Status:  Discontinued     1,250 mg 166.7 mL/hr over 90 Minutes 03/10/20 1327   03/10/20 2200  cefTRIAXone (ROCEPHIN) 1 g in sodium chloride 0.9 % 100 mL IVPB  Status:  Discontinued     1 g 200 mL/hr over 30 Minutes 03/11/20 1036   03/09/20 2230  vancomycin (VANCOCIN) IVPB 1000 mg/200 mL premix     1,000 mg 200 mL/hr over 60 Minutes 03/10/20 0241   03/09/20 2030  cefTRIAXone (ROCEPHIN) 1 g in sodium chloride 0.9 % 100 mL IVPB  1 g 200 mL/hr over 30 Minutes 03/09/20 2149   03/09/20 2030  vancomycin (VANCOCIN) IVPB 1000 mg/200 mL premix     1,000 mg 200 mL/hr over 60 Minutes 03/09/20 2318       Discharge Exam: Vitals:   03/13/20 1932 03/14/20 0537 03/14/20 0721 03/14/20 1124  BP: 124/76 (!) 132/95 (!) 143/90 (!) 97/57  Pulse: 88 61 79 66  Resp: 20 18 17 17   Temp: 98 F (36.7 C) (!) 97.5 F (36.4 C) 97.7 F (36.5 C) 98.2 F (36.8 C)  TempSrc:       SpO2: 94% 96% 94% 93%  Weight:  82.3 kg    Height:        General: A/O x1 (does not know where, when, why).  More alert.  Follows commands.  No acute respiratory distress Eyes: negative scleral hemorrhage, negative anisocoria, negative icterus ENT: Negative Runny nose, negative gingival bleeding, Neck:  Negative scars, masses, torticollis, lymphadenopathy, JVD Lungs: Clear to auscultation bilaterally without wheezes or crackles Cardiovascular: Regular rate and rhythm without murmur gallop or rub normal S1 and S2  Discharge Instructions   Allergies as of 03/14/2020      Reactions   Amlodipine Other (See Comments)   malaise malaise malaise   Amlodipine Besylate Other (See Comments)   Amoxicillin Other (See Comments)   Amoxicillin-pot Clavulanate Other (See Comments)   Other reaction(s): Other (See Comments) Cannot remember. Cannot remember.   Penicillins Other (See Comments)   Has patient had a PCN reaction causing immediate rash, facial/tongue/throat swelling, SOB or lightheadedness with hypotension: Unknown Has patient had a PCN reaction causing severe rash involving mucus membranes or skin necrosis: Unknown Has patient had a PCN reaction that required hospitalization: Unknown Has patient had a PCN reaction occurring within the last 10 years: No If all of the above answers are "NO", then may proceed with Cephalosporin use. Cannot remember.   Codeine Rash, Other (See Comments)   Codeine Sulfate Rash      Medication List    STOP taking these medications   B-12 1000 MCG Tabs   melatonin 5 MG Tabs   torsemide 20 MG tablet Commonly known as: DEMADEX     TAKE these medications   acetaminophen 325 MG tablet Commonly known as: TYLENOL Take 2 tablets (650 mg total) by mouth every 4 (four) hours as needed for headache or mild pain.   alum & mag hydroxide-simeth 200-200-20 MG/5ML suspension Commonly known as: MAALOX/MYLANTA Take 30 mLs by mouth every 6 (six) hours as  needed for indigestion or heartburn.   ammonium lactate 12 % lotion Commonly known as: LAC-HYDRIN Apply 1 application topically 2 (two) times daily. (apply to the lower legs)   apixaban 5 MG Tabs tablet Commonly known as: Eliquis Take 1 tablet (5 mg total) by mouth 2 (two) times daily.   donepezil 5 MG tablet Commonly known as: ARICEPT Take 5 mg by mouth at bedtime.   doxycycline 100 MG tablet Commonly known as: VIBRA-TABS Take 1 tablet (100 mg total) by mouth every 12 (twelve) hours.   escitalopram 5 MG tablet Commonly known as: LEXAPRO Take 5 mg by mouth daily.   feeding supplement (ENSURE ENLIVE) Liqd Take 237 mLs by mouth 2 (two) times daily between meals.   furosemide 10 MG/ML injection Commonly known as: LASIX Inject 4 mLs (40 mg total) into the vein 2 (two) times daily.   levothyroxine 75 MCG tablet Commonly known as: SYNTHROID Take 75 mcg by mouth daily before breakfast.  metoprolol tartrate 25 MG tablet Commonly known as: LOPRESSOR Take 0.5 tablets (12.5 mg total) by mouth 2 (two) times daily.   neomycin-bacitracin-polymyxin [phone] ointment Apply 1 application topically daily.   ondansetron 4 MG/2ML Soln injection Commonly known as: ZOFRAN Inject 2 mLs (4 mg total) into the vein every 6 (six) hours as needed for nausea.   potassium chloride 10 MEQ tablet Commonly known as: KLOR-CON Take 1 tablet (10 mEq total) by mouth 2 (two) times daily.   QUEtiapine 25 MG tablet Commonly known as: SEROQUEL Take 25 mg by mouth at bedtime.   risperiDONE 0.5 MG tablet Commonly known as: RISPERDAL Take 0.25 mg by mouth 2 (two) times daily. What changed: Another medication with the same name was changed. Make sure you understand how and when to take each.   risperiDONE 0.5 MG tablet Commonly known as: RISPERDAL Take 1 tablet (0.5 mg total) by mouth 2 (two) times daily as needed (aggitation). What changed:   when to take this  reasons to take this       Allergies  Allergen Reactions  . Amlodipine Other (See Comments)    malaise malaise malaise  . Amlodipine Besylate Other (See Comments)  . Amoxicillin Other (See Comments)  . Amoxicillin-Pot Clavulanate Other (See Comments)    Other reaction(s): Other (See Comments) Cannot remember. Cannot remember.  . Penicillins Other (See Comments)    Has patient had a PCN reaction causing immediate rash, facial/tongue/throat swelling, SOB or lightheadedness with hypotension: Unknown Has patient had a PCN reaction causing severe rash involving mucus membranes or skin necrosis: Unknown Has patient had a PCN reaction that required hospitalization: Unknown Has patient had a PCN reaction occurring within the last 10 years: No If all of the above answers are "NO", then may proceed with Cephalosporin use. Cannot remember.  . Codeine Rash and Other (See Comments)  . Codeine Sulfate Rash    Contact information for follow-up providers    [facility] on 03/18/2020.   Specialty: Cardiology Why: at 2:00pm. Enter through the [facility] entrance Contact information: [place] [phone]           Contact information for after-discharge care    Destination    [facility]erred [facility] .   Service: Skilled Nursing Contact information: [address] [place] [phone]                   The results of significant diagnostics from this hospitalization (including imaging, microbiology, ancillary and laboratory) are listed below for reference.    Significant Diagnostic Studies: US Venous Img Lower Bilateral (DVT)  Result Date: 03/10/2020 CLINICAL DATA:  Bilateral lower extremity edema EXAM: BILATERAL LOWER EXTREMITY VENOUS DOPPLER ULTRASOUND TECHNIQUE: Gray-scale sonography with compression, as well as color and duplex ultrasound, were  performed to evaluate the deep venous system(s) from the level of the common femoral vein through the popliteal and proximal calf veins. COMPARISON:  01/31/2018 FINDINGS: VENOUS Normal compressibility of the common femoral, superficial femoral, and popliteal veins, as well as the visualized calf veins. Visualized portions of profunda femoral vein and great saphenous vein unremarkable. No filling defects to suggest DVT on grayscale or color Doppler imaging. Doppler waveforms show normal direction of venous flow, normal respiratory plasticity and response to augmentation. OTHER Mild subcutaneous edema bilateral calves. Limitations: none IMPRESSION: Negative. Electronically Signed   By: Diana Eves.D.  On: 03/10/2020 15:13   DG Chest Portable 1 View  Result Date: 03/09/2020 CLINICAL DATA:  Shortness of breath. EXAM: PORTABLE CHEST 1 VIEW COMPARISON:  October 24, 2018 FINDINGS: Stable cardiomegaly. New small right effusion with underlying atelectasis. Effusion with underlying opacity in left base, unchanged. No overt edema. No other interval changes. IMPRESSION: New small right effusion with underlying atelectasis. Stable opacity and probable associated effusion in the left base. Stable cardiomegaly. No other change. Electronically Signed   By: Gerome Samavid  Williams III M.D   On: 03/09/2020 19:29   ECHOCARDIOGRAM COMPLETE  Result Date: 03/11/2020    ECHOCARDIOGRAM REPORT   Patient Name:   Cheryl Winters Date of Exam: 03/10/2020 Medical Rec #:  [id number]        Height:       62.0 in Accession #:    [id number]       Weight:       191.6 lb Date of Birth:  [birth year]        BSA:          1.877 m Patient Age:    86 years         BP:           129/99 mmHg Patient Gender: F                HR:           73 bpm. Exam Location:  [facility] Procedure: 2D Echo, Cardiac Doppler and Color Doppler Indications:     CHF- acute systolic 428.21  History:         Patient has prior history of Echocardiogram examinations, most                   recent 11/03/2017. Signs/Symptoms:Syncope; Risk                  Factors:Hypertension. Persistent Afib.  Sonographer:     Cristela BlueJerry Hege RDCS (AE) Referring Phys:  [id number] Andris BaumannHAZEL V DUNCAN Diagnosing Phys: Alwyn Peawayne D Callwood MD IMPRESSIONS  1. Left ventricular ejection fraction, by estimation, is 55 to 60%. Left ventricular ejection fraction by PLAX is 61 %. The left ventricle has normal function. The left ventricle has no regional wall motion abnormalities. The left ventricular internal cavity size was mildly to moderately dilated. Left ventricular diastolic parameters are consistent with Grade I diastolic dysfunction (impaired relaxation).  2. Right ventricular systolic function is moderately reduced. The right ventricular size is normal. Mildly increased right ventricular wall thickness. There is severely elevated pulmonary artery systolic pressure.  3. Left atrial size was severely dilated.  4. Right atrial size was mild to moderately dilated.  5. The mitral valve is normal in structure. Mild mitral valve regurgitation.  6. Tricuspid valve regurgitation is mild to moderate.  7. The aortic valve is grossly normal. Aortic valve regurgitation is not visualized. FINDINGS  Left Ventricle: Left ventricular ejection fraction, by estimation, is 55 to 60%. Left ventricular ejection fraction by PLAX is 61 %. The left ventricle has normal function. The left ventricle has no regional wall motion abnormalities. The left ventricular internal cavity size was mildly to moderately dilated. There is no left ventricular hypertrophy. Left ventricular diastolic parameters are consistent with Grade I diastolic dysfunction (impaired relaxation). Right Ventricle: The right ventricular size is normal. Mildly increased right ventricular wall thickness. Right ventricular systolic function is moderately reduced. There is severely elevated pulmonary artery systolic pressure. The tricuspid regurgitant velocity is 3.59 m/s, and with an assumed  right atrial pressure of 10 mmHg, the estimated right ventricular systolic pressure is 61.6 mmHg. Left Atrium: Left atrial size was severely dilated. Right Atrium: Right atrial size was mild to moderately dilated. Pericardium: There is no evidence of pericardial effusion. Mitral Valve: The mitral valve is normal in structure. Mild mitral valve regurgitation. Tricuspid Valve: The tricuspid valve is normal in structure. Tricuspid valve regurgitation is mild to moderate. Aortic Valve: The aortic valve is grossly normal. Aortic valve regurgitation is not visualized. Aortic valve mean gradient measures 2.5 mmHg. Aortic valve peak gradient measures 4.8 mmHg. Aortic valve area, by VTI measures 2.48 cm. Pulmonic Valve: The pulmonic valve was grossly normal. Pulmonic valve regurgitation is not visualized. Aorta: The aortic root is normal in size and structure. IAS/Shunts: No atrial level shunt detected by color flow Doppler.  LEFT VENTRICLE PLAX 2D LV EF:         Left ventricular ejection fraction by PLAX is 61 %. LVIDd:         3.76 cm LVIDs:         2.56 cm LV PW:         1.26 cm LV IVS:        0.76 cm LVOT diam:     2.00 cm LV SV:         46 LV SV Index:   24 LVOT Area:     3.14 cm  RIGHT VENTRICLE RV Basal diam:  3.49 cm RV S prime:     9.75 cm/s TAPSE (M-mode): 2.7 cm LEFT ATRIUM              Index        RIGHT ATRIUM           Index LA diam:        5.40 cm  2.88 cm/m   RA Area:     23.30 cm LA Vol (A2C):   216.0 ml 115.07 ml/m RA Volume:   66.00 ml  35.16 ml/m LA Vol (A4C):   153.0 ml 81.51 ml/m LA Biplane Vol: 185.0 ml 98.56 ml/m  AORTIC VALVE                   PULMONIC VALVE AV Area (Vmax):    2.16 cm    PV Vmax:        0.56 m/s AV Area (Vmean):   2.26 cm    PV Peak grad:   1.3 mmHg AV Area (VTI):     2.48 cm    RVOT Peak grad: 3 mmHg AV Vmax:           109.00 cm/s AV Vmean:          76.200 cm/s AV VTI:            0.185 m AV Peak Grad:      4.8 mmHg AV Mean Grad:      2.5 mmHg LVOT Vmax:         75.10 cm/s  LVOT Vmean:        54.800 cm/s LVOT VTI:          0.146 m LVOT/AV VTI ratio: 0.79  AORTA Ao Root diam: 3.00 cm MITRAL VALVE                TRICUSPID VALVE MV Area (PHT): 4.26 cm     TR Peak grad:   51.6 mmHg MV Decel Time: 178 msec     TR Vmax:  359.00 cm/s MV E velocity: 103.00 cm/s                             SHUNTS                             Systemic VTI:  0.15 m                             Systemic Diam: 2.00 cm Dwayne Salome Arnt MD Electronically signed by Alwyn Pea MD Signature Date/Time: 03/11/2020/8:50:08 AM    Final     Microbiology: Recent Results (from the past 240 hour(s))  SARS CORONAVIRUS 2 (TAT 6-24 HRS) Nasopharyngeal Nasopharyngeal Swab     Status: None   Collection Time: 03/09/20  9:16 PM   Specimen: Nasopharyngeal Swab  Result Value Ref Range Status   SARS Coronavirus 2 NEGATIVE NEGATIVE Final    Comment: (NOTE) SARS-CoV-2 target nucleic acids are NOT DETECTED. The SARS-CoV-2 RNA is generally detectable in upper and lower respiratory specimens during the acute phase of infection. Negative results do not preclude SARS-CoV-2 infection, do not rule out co-infections with other pathogens, and should not be used as the sole basis for treatment or other patient management decisions. Negative results must be combined with clinical observations, patient history, and epidemiological information. The expected result is Negative. Fact Sheet for Patients: HairSlick.no Fact Sheet for Healthcare Providers: quierodirigir.com This test is not yet approved or cleared by the [place] FDA and  has been authorized for detection and/or diagnosis of SARS-CoV-2 by FDA under an Emergency Use Authorization (EUA). This EUA will remain  in effect (meaning this test can be used) for the duration of the COVID-19 declaration under Section 56 4(b)(1) of the Act, 21 U.S.C. section 360bbb-3(b)(1), unless the authorization is  terminated or revoked sooner. Performed at [facility], [address] [place], [place] [address]   MRSA PCR Screening     Status: None   Collection Time: 03/11/20  6:22 AM   Specimen: Nasopharyngeal  Result Value Ref Range Status   MRSA by PCR NEGATIVE NEGATIVE Final    Comment:        The GeneXpert MRSA Assay (FDA approved for NASAL specimens only), is one component of a comprehensive MRSA colonization surveillance program. It is not intended to diagnose MRSA infection nor to guide or monitor treatment for MRSA infections. Performed at [facility], [address] [place], [place] [address]      Labs: Basic Metabolic Panel: Recent Labs  Lab 03/10/20 0520 03/11/20 0347 03/12/20 0508 03/13/20 0555 03/14/20 0605  NA 140 140 139 140 141  K 3.6 3.3* 3.4* 3.7 3.4*  CL 106 103 101 101 101  CO2 GLUCOSE 87 82 89 96 88  BUN CREATININE 0.56 0.71 0.69 0.77 0.62  CALCIUM 8.4* 8.7* 9.0 9.1 8.9  MG  --  1.8 1.9 2.0 2.0  PHOS  --   --   --  4.0 3.5   Liver Function Tests: Recent Labs  Lab 03/13/20 0555 03/14/20 0605  AST 14* 16  ALT 11 10  ALKPHOS 79 84  BILITOT 0.6 0.8  PROT 6.8 6.7  ALBUMIN 3.4* 3.3*   No results for input(s): LIPASE, AMYLASE in the last 168 hours. No results  for input(s): AMMONIA in the last 168 hours. CBC: Recent Labs  Lab 03/09/20 1909 03/13/20 0555 03/14/20 0605  WBC 5.5 6.9 5.7  NEUTROABS 2.8  --   --   HGB 12.4 13.2 13.5  HCT 37.4 40.2 41.2  MCV 91.2 91.6 91.8  PLT 185 174 175   Cardiac Enzymes: No results for input(s): CKTOTAL, CKMB, CKMBINDEX, TROPONINI in the last 168 hours. BNP: BNP (last 3 results) Recent Labs    03/09/20 1909  BNP 285.0*    ProBNP (last 3 results) No results for input(s): PROBNP in the last 8760 hours.  CBG: No results for input(s): GLUCAP in the last 168 hours.     Signed:  Carolyne Littles, MD [facility] [phone] pager

## 2020-03-15 LAB — CBC
HCT: 36.9 % (ref 36.0–46.0)
Hemoglobin: 12.1 g/dL (ref 12.0–15.0)
MCH: 30 pg (ref 26.0–34.0)
MCHC: 32.8 g/dL (ref 30.0–36.0)
MCV: 91.3 fL (ref 80.0–100.0)
Platelets: 184 10*3/uL (ref 150–400)
RBC: 4.04 MIL/uL (ref 3.87–5.11)
RDW: 13.7 % (ref 11.5–15.5)
WBC: 5.7 10*3/uL (ref 4.0–10.5)
nRBC: 0 % (ref 0.0–0.2)

## 2020-03-15 LAB — COMPREHENSIVE METABOLIC PANEL
ALT: 9 U/L (ref 0–44)
AST: 13 U/L — ABNORMAL LOW (ref 15–41)
Albumin: 3.1 g/dL — ABNORMAL LOW (ref 3.5–5.0)
Alkaline Phosphatase: 78 U/L (ref 38–126)
Anion gap: 8 (ref 5–15)
BUN: 24 mg/dL — ABNORMAL HIGH (ref 8–23)
CO2: 31 mmol/L (ref 22–32)
Calcium: 8.9 mg/dL (ref 8.9–10.3)
Chloride: 100 mmol/L (ref 98–111)
Creatinine, Ser: 0.82 mg/dL (ref 0.44–1.00)
GFR calc Af Amer: >60 mL/min (ref >60)
GFR calc non Af Amer: >60 mL/min (ref >60)
Glucose, Bld: 88 mg/dL (ref 70–99)
Potassium: 4 mmol/L (ref 3.5–5.1)
Sodium: 139 mmol/L (ref 135–145)
Total Bilirubin: 1 mg/dL (ref 0.3–1.2)
Total Protein: 6.4 g/dL — ABNORMAL LOW (ref 6.5–8.1)

## 2020-03-15 LAB — SARS CORONAVIRUS 2 (TAT 6-24 HRS): SARS Coronavirus 2: NEGATIVE

## 2020-03-15 LAB — PHOSPHORUS: Phosphorus: 3.6 mg/dL (ref 2.5–4.6)

## 2020-03-15 LAB — MAGNESIUM: Magnesium: 2 mg/dL (ref 1.7–2.4)

## 2020-03-15 NOTE — TOC Transition Note (Signed)
Transition of Care [facility]) - CM/SW Discharge Note   Patient Details  Name: Cheryl Winters MRN: [id number] Date of Birth: 1934/07/09  [facility] CM/SW Contact:  Maree Krabbe, LCSW Phone Number: 03/15/2020, 11:12 AM   Clinical Narrative:   Clinical Social Worker facilitated patient discharge including contacting patient family and facility to confirm patient discharge plans.  Clinical information faxed to facility and family agreeable with plan.  CSW arranged ambulance transport via [facility] to [facility].  RN to call [phone] for report prior to discharge.    Final next level of care: Skilled Nursing Facility Barriers to Discharge: No Barriers Identified   Patient Goals and CMS Choice        Discharge Placement              Patient chooses bed at: [facility] Patient to be transferred to facility by: [facility] Name of family member notified: Jayme Patient and family notified of of transfer: 03/15/20  Discharge Plan and Services   Discharge Planning Services: CM Consult                                 Social Determinants of Health (SDOH) Interventions     Readmission Risk Interventions No flowsheet data found.

## 2020-03-15 NOTE — Discharge Instructions (Signed)
Shortness of Breath, Adult Shortness of breath is when a person has trouble breathing enough air or when a person feels like she or he is having trouble breathing in enough air. Shortness of breath could be a sign of a medical problem. Follow these instructions at home:   Pay attention to any changes in your symptoms.  Do not use any products that contain nicotine or tobacco, such as cigarettes, e-cigarettes, and chewing tobacco.  Do not smoke. Smoking is a common cause of shortness of breath. If you need help quitting, ask your health care provider.  Avoid things that can irritate your airways, such as: ? Mold. ? Dust. ? Air pollution. ? Chemical fumes. ? Things that can cause allergy symptoms (allergens), if you have allergies.  Keep your living space clean and free of mold and dust.  Rest as needed. Slowly return to your usual activities.  Take over-the-counter and prescription medicines only as told by your health care provider. This includes oxygen therapy and inhaled medicines.  Keep all follow-up visits as told by your health care provider. This is important. Contact a health care provider if:  Your condition does not improve as soon as expected.  You have a hard time doing your normal activities, even after you rest.  You have new symptoms. Get help right away if:  Your shortness of breath gets worse.  You have shortness of breath when you are resting.  You feel light-headed or you faint.  You have a cough that is not controlled with medicines.  You cough up blood.  You have pain with breathing.  You have pain in your chest, arms, shoulders, or abdomen.  You have a fever.  You cannot walk up stairs or exercise the way that you normally do. These symptoms may represent a serious problem that is an emergency. Do not wait to see if the symptoms will go away. Get medical help right away. Call your local emergency services (911 in the [place] Do not drive yourself  to the hospital. Summary  Shortness of breath is when a person has trouble breathing enough air. It can be a sign of a medical problem.  Avoid things that irritate your lungs, such as smoking, pollution, mold, and dust.  Pay attention to changes in your symptoms and contact your health care provider if you have a hard time completing daily activities because of shortness of breath. This information is not intended to replace advice given to you by your health care provider. Make sure you discuss any questions you have with your health care provider. Document Revised: 03/06/2018 Document Reviewed: 03/06/2018 Elsevier Patient Education  2020 [employer] Inc.  

## 2020-03-15 NOTE — Progress Notes (Signed)
Called and gave report to the receiving facility nurse at this time. All questions answered. Jaclyn Andy M Jashira Cotugno 

## 2020-03-15 NOTE — Progress Notes (Signed)
Attempted to call nurse to nurse report on patient to [facility] at this time, twice. The first time I was put on hold for longer than 7 minutes. The second attempt I requested the receiving individual call me back whenever the facility staff knew who was to be taking report. Was immediately hung up on. Will re-attempt to call report to this facility when EMS arrives. Jari Favre [facility]

## 2020-03-16 NOTE — Progress Notes (Deleted)
   Patient ID: Cheryl Winters, female    DOB: [birth year], 86 y.o.   MRN: [id number]  HPI  Ms Oyster is a 86 y/o female with a history of   Echo report from 03/10/20 reviewed and showed an EF of 55-60% along with severely elevated PA pressure, severe LAE, mild MR and mild/moderate AR.   Admitted 03/09/20 due to shortness of breath. Initially given IV lasix with transition to oral diuretics. Palliative care consult obtained. Given IV antibiotics due to lower leg cellulitis with transition to oral antibiotics. Lower extremity doppler negative for DVT. Discharged after 6 days to [facility].   She presents today for her initial visit with a chief complaint of   Review of Systems    Physical Exam    Assessment & Plan:  1: Chronic heart failure with preserved ejection fraction with structural changes- - NYHA class - BNP 03/09/20 was 285.0  2: HTN- - BP - PCP is [facility] - BMP 5//29/21 reviewed and showed sodium 139, potassium 4.0, creatinine 0.82 and GFR >60  3: Dementia-

## 2020-03-18 ENCOUNTER — Ambulatory Visit: Payer: Medicare Other | Admitting: Family

## 2020-03-21 ENCOUNTER — Ambulatory Visit: Payer: Medicare Other | Admitting: Family

## 2020-10-18 DEATH — deceased
